# Patient Record
Sex: Female | Born: 1950
Health system: Southern US, Community
[De-identification: ages and names within clinical notes are randomized; demographics above are authoritative.]

## PROBLEM LIST (undated history)

## (undated) DIAGNOSIS — R011 Cardiac murmur, unspecified: Secondary | ICD-10-CM

## (undated) DIAGNOSIS — M8580 Other specified disorders of bone density and structure, unspecified site: Secondary | ICD-10-CM

## (undated) DIAGNOSIS — K219 Gastro-esophageal reflux disease without esophagitis: Secondary | ICD-10-CM

## (undated) DIAGNOSIS — M545 Low back pain, unspecified: Secondary | ICD-10-CM

## (undated) DIAGNOSIS — N261 Atrophy of kidney (terminal): Secondary | ICD-10-CM

## (undated) DIAGNOSIS — R7611 Nonspecific reaction to tuberculin skin test without active tuberculosis: Secondary | ICD-10-CM

## (undated) DIAGNOSIS — M810 Age-related osteoporosis without current pathological fracture: Secondary | ICD-10-CM

## (undated) DIAGNOSIS — G43909 Migraine, unspecified, not intractable, without status migrainosus: Secondary | ICD-10-CM

## (undated) HISTORY — DX: Cardiac murmur, unspecified: R01.1

## (undated) HISTORY — PX: TUBAL LIGATION: SHX77

## (undated) HISTORY — DX: Other specified disorders of bone density and structure, unspecified site: M85.80

## (undated) HISTORY — DX: Age-related osteoporosis without current pathological fracture: M81.0

## (undated) HISTORY — DX: Low back pain: M54.5

## (undated) HISTORY — DX: Migraine, unspecified, not intractable, without status migrainosus: G43.909

## (undated) HISTORY — DX: Gastro-esophageal reflux disease without esophagitis: K21.9

## (undated) HISTORY — DX: Nonspecific reaction to tuberculin skin test without active tuberculosis: R76.11

## (undated) HISTORY — DX: Low back pain, unspecified: M54.50

## (undated) HISTORY — DX: Atrophy of kidney (terminal): N26.1

---

## 1994-05-23 HISTORY — PX: ABDOMINAL HYSTERECTOMY: SHX81

## 2003-05-24 LAB — HM COLONOSCOPY

## 2010-02-17 ENCOUNTER — Emergency Department (HOSPITAL_COMMUNITY): Admission: EM | Admit: 2010-02-17 | Discharge: 2010-02-17 | Payer: Self-pay | Admitting: Family Medicine

## 2010-06-16 ENCOUNTER — Other Ambulatory Visit (HOSPITAL_COMMUNITY): Payer: Self-pay | Admitting: Sports Medicine

## 2010-06-16 ENCOUNTER — Other Ambulatory Visit: Payer: Self-pay

## 2010-06-16 DIAGNOSIS — Z139 Encounter for screening, unspecified: Secondary | ICD-10-CM

## 2010-06-28 ENCOUNTER — Ambulatory Visit (HOSPITAL_COMMUNITY)
Admission: RE | Admit: 2010-06-28 | Discharge: 2010-06-28 | Disposition: A | Discharge status: Home / Self Care | Payer: 59 | Source: Ambulatory Visit | Attending: Obstetrics and Gynecology | Admitting: Obstetrics and Gynecology

## 2010-06-28 DIAGNOSIS — Z1231 Encounter for screening mammogram for malignant neoplasm of breast: Secondary | ICD-10-CM | POA: Insufficient documentation

## 2010-06-28 DIAGNOSIS — Z139 Encounter for screening, unspecified: Secondary | ICD-10-CM

## 2010-09-13 ENCOUNTER — Other Ambulatory Visit (HOSPITAL_COMMUNITY): Payer: Self-pay | Admitting: Obstetrics and Gynecology

## 2010-09-13 DIAGNOSIS — Z139 Encounter for screening, unspecified: Secondary | ICD-10-CM

## 2010-10-15 ENCOUNTER — Encounter: Payer: Self-pay | Admitting: Internal Medicine

## 2010-10-15 ENCOUNTER — Ambulatory Visit (INDEPENDENT_AMBULATORY_CARE_PROVIDER_SITE_OTHER): Payer: 59 | Admitting: Internal Medicine

## 2010-10-15 ENCOUNTER — Telehealth: Payer: Self-pay | Admitting: Internal Medicine

## 2010-10-15 VITALS — BP 130/82 | HR 78 | Temp 98.6°F | Ht 63.0 in | Wt 123.5 lb

## 2010-10-15 DIAGNOSIS — K219 Gastro-esophageal reflux disease without esophagitis: Secondary | ICD-10-CM | POA: Insufficient documentation

## 2010-10-15 DIAGNOSIS — G44019 Episodic cluster headache, not intractable: Secondary | ICD-10-CM | POA: Insufficient documentation

## 2010-10-15 DIAGNOSIS — Z0000 Encounter for general adult medical examination without abnormal findings: Secondary | ICD-10-CM

## 2010-10-15 DIAGNOSIS — M899 Disorder of bone, unspecified: Secondary | ICD-10-CM

## 2010-10-15 DIAGNOSIS — M8580 Other specified disorders of bone density and structure, unspecified site: Secondary | ICD-10-CM

## 2010-10-15 HISTORY — DX: Other specified disorders of bone density and structure, unspecified site: M85.80

## 2010-10-15 NOTE — Patient Instructions (Signed)
Continue all other medications as before Please return in 1 year for your yearly visit, or sooner if needed, with Lab testing done 3-5 days before  

## 2010-10-15 NOTE — Assessment & Plan Note (Signed)

## 2010-10-15 NOTE — Progress Notes (Signed)
Subjective:    Patient ID: Beverly Shaw, female    DOB: 05/11/1951, 60 y.o.   MRN: [id number]  HPI  Here for wellness and f/u;  Overall doing ok;  Pt denies CP, worsening SOB, DOE, wheezing, orthopnea, PND, worsening LE edema, palpitations, dizziness or syncope.  Pt denies neurological change such as new Headache, facial or extremity weakness.  Pt denies polydipsia, polyuria, or low sugar symptoms. Pt states overall good compliance with treatment and medications, good tolerability, and trying to follow lower cholesterol diet.  Pt denies worsening depressive symptoms, suicidal ideation or panic. No fever, wt loss, night sweats, loss of appetite, or other constitutional symptoms.  Pt states good ability with ADL's, low fall risk, home safety reviewed and adequate, no significant changes in hearing or vision, and occasionally active with exercise.  Does have sense of heaviness to the legs below the knees, quit smoking over 15 yrs ago, mother s/p bilat amputation BKA due to PVD (nonsmoker) and just died 10 days ago. Pt is from [place] and o/w does not know much FH. Also mentions fingers go white with cold for over 15 yrs.  Does have daily exercise with walking, though she has sedentary job. Past Medical History  Diagnosis Date  . Migraines   . Positive TB test     Positive TB skin test  . GERD (gastroesophageal reflux disease)   . Heart murmur   . Episodic low back pain    Past Surgical History  Procedure Date  . Abdominal hysterectomy 1996    reports that she has never smoked. She does not have any smokeless tobacco history on file. She reports that she drinks alcohol. She reports that she does not use illicit drugs. family history includes Diabetes in her other and Heart disease in her father and mother. No Known Allergies No current outpatient prescriptions on file prior to visit.   Review of Systems Review of Systems  Constitutional: Negative for diaphoresis, activity change, appetite change  and unexpected weight change.  HENT: Negative for hearing loss, ear pain, facial swelling, mouth sores and neck stiffness.   Eyes: Negative for pain, redness and visual disturbance.  Respiratory: Negative for shortness of breath and wheezing.   Cardiovascular: Negative for chest pain and palpitations.  Gastrointestinal: Negative for diarrhea, blood in stool, abdominal distention and rectal pain.  Genitourinary: Negative for hematuria, flank pain and decreased urine volume.  Musculoskeletal: Negative for myalgias and joint swelling.  Skin: Negative for color change and wound.  Neurological: Negative for syncope and numbness.  Hematological: Negative for adenopathy.  Psychiatric/Behavioral: Negative for hallucinations, self-injury, decreased concentration and agitation.      Objective:   Physical Exam BP 130/82  Pulse 78  Temp(Src) 98.6 F (37 C) (Oral)  Ht 5\' 3"  (1.6 m)  Wt 123 lb 8 oz (56.019 kg)  BMI 21.88 kg/m2  SpO2 98% Physical Exam  VS noted Constitutional: Pt is oriented to person, place, and time. Appears well-developed and well-nourished.  HENT:  Head: Normocephalic and atraumatic.  Right Ear: External ear normal.  Left Ear: External ear normal.  Nose: Nose normal.  Mouth/Throat: Oropharynx is clear and moist.  Eyes: Conjunctivae and EOM are normal. Pupils are equal, round, and reactive to light.  Neck: Normal range of motion. Neck supple. No JVD present. No tracheal deviation present.  Cardiovascular: Normal rate, regular rhythm, normal heart sounds and intact distal pulses.   Pulmonary/Chest: Effort normal and breath sounds normal.  Abdominal: Soft. Bowel sounds are normal. There  is no tenderness.  Musculoskeletal: Normal range of motion. Exhibits no edema.  Lymphadenopathy:  Has no cervical adenopathy.  Neurological: Pt is alert and oriented to person, place, and time. Pt has normal reflexes. No cranial nerve deficit.  Skin: Skin is warm and dry. No rash noted.    Psychiatric:  Has  normal mood and affect. Behavior is normal.         Assessment & Plan:

## 2010-10-15 NOTE — Telephone Encounter (Signed)
Called the office of Dr. Thamas Jaegers GYN in [place]. They will be closed 10/13/10 through 10/17/2010 due to relocating. Will call back on Tuesday 10/19/2010 to have them fax the pts. Bone density results. Dr. Thamas Jaegers phone number [phone].

## 2010-10-15 NOTE — Telephone Encounter (Signed)
Please call DR Elissa Lovett GYN for results of most recent bone density

## 2010-10-19 NOTE — Telephone Encounter (Signed)
Called the GYN office in [place] and they agreed to send bone density.

## 2011-06-27 ENCOUNTER — Other Ambulatory Visit (HOSPITAL_COMMUNITY): Payer: Self-pay | Admitting: Obstetrics and Gynecology

## 2011-06-27 DIAGNOSIS — Z1231 Encounter for screening mammogram for malignant neoplasm of breast: Secondary | ICD-10-CM

## 2011-07-25 ENCOUNTER — Ambulatory Visit (HOSPITAL_COMMUNITY)
Admission: RE | Admit: 2011-07-25 | Discharge: 2011-07-25 | Disposition: A | Discharge status: Home / Self Care | Payer: 59 | Source: Ambulatory Visit | Attending: Obstetrics and Gynecology | Admitting: Obstetrics and Gynecology

## 2011-07-25 DIAGNOSIS — Z1231 Encounter for screening mammogram for malignant neoplasm of breast: Secondary | ICD-10-CM | POA: Insufficient documentation

## 2011-10-12 ENCOUNTER — Other Ambulatory Visit (INDEPENDENT_AMBULATORY_CARE_PROVIDER_SITE_OTHER): Payer: 59

## 2011-10-12 DIAGNOSIS — Z0000 Encounter for general adult medical examination without abnormal findings: Secondary | ICD-10-CM

## 2011-10-12 LAB — CBC WITH DIFFERENTIAL/PLATELET
Basophils Absolute: 0 10*3/uL (ref 0.0–0.1)
Eosinophils Absolute: 0.1 10*3/uL (ref 0.0–0.7)
Hemoglobin: 13 g/dL (ref 12.0–15.0)
Lymphocytes Relative: 27.5 % (ref 12.0–46.0)
Monocytes Relative: 9.3 % (ref 3.0–12.0)
Neutro Abs: 3.5 10*3/uL (ref 1.4–7.7)
Neutrophils Relative %: 61.9 % (ref 43.0–77.0)
RDW: 13.1 % (ref 11.5–14.6)

## 2011-10-12 LAB — HEPATIC FUNCTION PANEL
AST: 26 U/L (ref 0–37)
Albumin: 3.9 g/dL (ref 3.5–5.2)
Alkaline Phosphatase: 49 U/L (ref 39–117)
Bilirubin, Direct: 0.1 mg/dL (ref 0.0–0.3)
Total Bilirubin: 0.4 mg/dL (ref 0.3–1.2)

## 2011-10-12 LAB — URINALYSIS, ROUTINE W REFLEX MICROSCOPIC
Nitrite: NEGATIVE
Urine Glucose: NEGATIVE
Urobilinogen, UA: 0.2 (ref 0.0–1.0)

## 2011-10-12 LAB — BASIC METABOLIC PANEL
BUN: 23 mg/dL (ref 6–23)
CO2: 28 mEq/L (ref 19–32)
Calcium: 9 mg/dL (ref 8.4–10.5)
Chloride: 108 mEq/L (ref 96–112)
Creatinine, Ser: 0.8 mg/dL (ref 0.4–1.2)
Glucose, Bld: 89 mg/dL (ref 70–99)

## 2011-10-12 LAB — LIPID PANEL
LDL Cholesterol: 119 mg/dL — ABNORMAL HIGH (ref 0–99)
Total CHOL/HDL Ratio: 4

## 2011-10-15 ENCOUNTER — Encounter: Payer: Self-pay | Admitting: Internal Medicine

## 2011-10-15 DIAGNOSIS — R7611 Nonspecific reaction to tuberculin skin test without active tuberculosis: Secondary | ICD-10-CM | POA: Insufficient documentation

## 2011-10-15 DIAGNOSIS — M545 Low back pain, unspecified: Secondary | ICD-10-CM | POA: Insufficient documentation

## 2011-10-19 ENCOUNTER — Ambulatory Visit (INDEPENDENT_AMBULATORY_CARE_PROVIDER_SITE_OTHER): Payer: 59 | Admitting: Internal Medicine

## 2011-10-19 ENCOUNTER — Encounter: Payer: Self-pay | Admitting: Internal Medicine

## 2011-10-19 VITALS — BP 100/62 | HR 71 | Temp 98.1°F | Ht 62.0 in | Wt 120.1 lb

## 2011-10-19 DIAGNOSIS — B351 Tinea unguium: Secondary | ICD-10-CM

## 2011-10-19 DIAGNOSIS — M899 Disorder of bone, unspecified: Secondary | ICD-10-CM

## 2011-10-19 DIAGNOSIS — R143 Flatulence: Secondary | ICD-10-CM

## 2011-10-19 DIAGNOSIS — Z0000 Encounter for general adult medical examination without abnormal findings: Secondary | ICD-10-CM

## 2011-10-19 DIAGNOSIS — R140 Abdominal distension (gaseous): Secondary | ICD-10-CM

## 2011-10-19 DIAGNOSIS — M8580 Other specified disorders of bone density and structure, unspecified site: Secondary | ICD-10-CM

## 2011-10-19 DIAGNOSIS — R350 Frequency of micturition: Secondary | ICD-10-CM

## 2011-10-19 DIAGNOSIS — E785 Hyperlipidemia, unspecified: Secondary | ICD-10-CM

## 2011-10-19 DIAGNOSIS — R141 Gas pain: Secondary | ICD-10-CM

## 2011-10-19 MED ORDER — CEPHALEXIN 500 MG PO CAPS: 500.0000 mg | ORAL_CAPSULE | Freq: Four times a day (QID) | ORAL | Status: AC

## 2011-10-19 MED ORDER — TERBINAFINE HCL 250 MG PO TABS: 250.0000 mg | ORAL_TABLET | Freq: Every day | ORAL | Status: DC

## 2011-10-19 NOTE — Assessment & Plan Note (Signed)

## 2011-10-19 NOTE — Patient Instructions (Addendum)
Take all new medications as prescribed - the generic Lamisil fo 6 weeks, and antibiotic Continue all other medications as before You can also try Align OTC for the bloating symptom  Please return in 3 wks for repeat Liver tests only You will be contacted by phone if any changes need to be made immediately.  Otherwise, you will receive a letter about your results with an explanation. Please stop by the scheduling desk to have your Bone Density testing scheduled You are otherwise up to date with prevention Please continue your efforts at being more active, low cholesterol diet, and weight control. Please remember to followup with your GYN for the yearly pap smear and/or mammogram as you do Please return in 1 year for your yearly visit, or sooner if needed, with Lab testing done 3-5 days before

## 2011-10-21 ENCOUNTER — Ambulatory Visit (INDEPENDENT_AMBULATORY_CARE_PROVIDER_SITE_OTHER)
Admission: RE | Admit: 2011-10-21 | Discharge: 2011-10-21 | Disposition: A | Discharge status: Home / Self Care | Payer: 59 | Source: Ambulatory Visit | Attending: Internal Medicine | Admitting: Internal Medicine

## 2011-10-21 DIAGNOSIS — M8580 Other specified disorders of bone density and structure, unspecified site: Secondary | ICD-10-CM

## 2011-10-21 DIAGNOSIS — M899 Disorder of bone, unspecified: Secondary | ICD-10-CM

## 2011-10-23 ENCOUNTER — Encounter: Payer: Self-pay | Admitting: Internal Medicine

## 2011-10-23 DIAGNOSIS — E785 Hyperlipidemia, unspecified: Secondary | ICD-10-CM | POA: Insufficient documentation

## 2011-10-23 NOTE — Assessment & Plan Note (Signed)
For lamisil rx,  to f/u any worsening symptoms or concerns

## 2011-10-23 NOTE — Progress Notes (Signed)
Subjective:    Patient ID: Beverly Shaw, female    DOB: 04/09/1951, 61 y.o.   MRN: [id number]  HPI  Here for wellness and f/u;  Overall doing ok;  Pt denies CP, worsening SOB, DOE, wheezing, orthopnea, PND, worsening LE edema, palpitations, dizziness or syncope.  Pt denies neurological change such as new Headache, facial or extremity weakness.  Pt denies polydipsia, polyuria, or low sugar symptoms. Pt states overall good compliance with treatment and medications, good tolerability.  Pt denies worsening depressive symptoms, suicidal ideation or panic. No fever, wt loss, night sweats, loss of appetite, or other constitutional symptoms.  Pt states good ability with ADL's, low fall risk, home safety reviewed and adequate, no significant changes in hearing or vision, and occasionally active with exercise.  Mentions would like tx for several onychomycotic fingernails, has some bloating and discomfort on occasion, not really trying to follow lower chol diet recently.  Also with freq UTI symptoms, mild with freq and urgency for 3 days Past Medical History  Diagnosis Date  . Migraines   . Positive TB test     Positive TB skin test  . GERD (gastroesophageal reflux disease)   . Heart murmur   . Episodic low back pain   . Osteopenia 10/15/2010   Past Surgical History  Procedure Date  . Abdominal hysterectomy 1996  . Tubal ligation     reports that she has never smoked. She has never used smokeless tobacco. She reports that she drinks alcohol. She reports that she does not use illicit drugs. family history includes Diabetes in her other and Heart disease in her father and mother. No Known Allergies Current Outpatient Prescriptions on File Prior to Visit  Medication Sig Dispense Refill  . calcium carbonate (OS-CAL) 600 MG TABS Take 600 mg by mouth daily.        . Multiple Vitamin (MULTIVITAMIN) capsule Take 1 capsule by mouth daily.         Review of Systems Review of Systems  Constitutional: Negative  for diaphoresis, activity change, appetite change and unexpected weight change.  HENT: Negative for hearing loss, ear pain, facial swelling, mouth sores and neck stiffness.   Eyes: Negative for pain, redness and visual disturbance.  Respiratory: Negative for shortness of breath and wheezing.   Cardiovascular: Negative for chest pain and palpitations.  Gastrointestinal: Negative for diarrhea, blood in stool, abdominal distention and rectal pain.  Genitourinary: Negative for hematuria, flank pain and decreased urine volume.  Musculoskeletal: Negative for myalgias and joint swelling.  Skin: Negative for color change and wound.  Neurological: Negative for syncope and numbness.  Hematological: Negative for adenopathy.  Psychiatric/Behavioral: Negative for hallucinations, self-injury, decreased concentration and agitation.      Objective:   Physical Exam BP 100/62  Pulse 71  Temp(Src) 98.1 F (36.7 C) (Oral)  Ht 5\' 2"  (1.575 m)  Wt 120 lb 2 oz (54.488 kg)  BMI 21.97 kg/m2  SpO2 95% Physical Exam  VS noted Constitutional: Pt is oriented to person, place, and time. Appears well-developed and well-nourished.  HENT:  Head: Normocephalic and atraumatic.  Right Ear: External ear normal.  Left Ear: External ear normal.  Nose: Nose normal.  Mouth/Throat: Oropharynx is clear and moist.  Eyes: Conjunctivae and EOM are normal. Pupils are equal, round, and reactive to light.  Neck: Normal range of motion. Neck supple. No JVD present. No tracheal deviation present.  Cardiovascular: Normal rate, regular rhythm, normal heart sounds and intact distal pulses.   Pulmonary/Chest: Effort normal  and breath sounds normal.  Abdominal: Soft. Bowel sounds are normal. There is no tenderness.  - benign exam Musculoskeletal: Normal range of motion. Exhibits no edema.  Lymphadenopathy:  Has no cervical adenopathy.  Neurological: Pt is alert and oriented to person, place, and time. Pt has normal reflexes. No  cranial nerve deficit.  Skin: Skin is warm and dry. No rash noted. Several nails with subungual onychomycotic change Psychiatric:  Has  normal mood and affect. Behavior is normal. except 1+ nervous    Assessment & Plan:

## 2011-10-23 NOTE — Assessment & Plan Note (Signed)
For dxa as she is due 

## 2011-10-23 NOTE — Assessment & Plan Note (Signed)
Ok for [employer] align prn

## 2011-10-23 NOTE — Assessment & Plan Note (Signed)
Mild to mod, for antibx course,  to f/u any worsening symptoms or concerns 

## 2011-10-23 NOTE — Assessment & Plan Note (Signed)
Lab Results  Component Value Date   LDLCALC 119* 10/12/2011   For lower chol diet

## 2011-10-31 ENCOUNTER — Telehealth: Payer: Self-pay

## 2011-10-31 NOTE — Telephone Encounter (Signed)
Ok to forward to [employer], who checks for [place]  Jasmine December to let pt know

## 2011-10-31 NOTE — Telephone Encounter (Signed)
Pt called stating she agreed to start Prolia injections for Osteopenia but has yet to be contacted with information regarding cost. Has JWJ started verification process?

## 2011-11-01 NOTE — Telephone Encounter (Signed)
Will forward note to MD. Insurance requesting clinical information and a letter from MD of medical necessity.  Placed form on MD's desk.

## 2011-11-09 ENCOUNTER — Encounter: Payer: Self-pay | Admitting: Internal Medicine

## 2011-11-09 ENCOUNTER — Other Ambulatory Visit (INDEPENDENT_AMBULATORY_CARE_PROVIDER_SITE_OTHER): Payer: 59

## 2011-11-09 DIAGNOSIS — B351 Tinea unguium: Secondary | ICD-10-CM

## 2011-11-09 LAB — HEPATIC FUNCTION PANEL
ALT: 25 U/L (ref 0–35)
AST: 29 U/L (ref 0–37)
Total Protein: 6.5 g/dL (ref 6.0–8.3)

## 2011-11-18 ENCOUNTER — Telehealth: Payer: Self-pay

## 2011-11-18 NOTE — Telephone Encounter (Signed)
Pt called requesting status of Prolia injection certification, please advise.

## 2011-11-20 ENCOUNTER — Encounter: Payer: Self-pay | Admitting: Internal Medicine

## 2011-11-20 NOTE — Telephone Encounter (Signed)
Insurance is requiring a letter of medical necessity, which has been done but not yet sent; will be sent mon July for hopeful approval of the prolia

## 2011-11-21 NOTE — Telephone Encounter (Signed)
Put letter requested on Beverly Shaw's desk along with form

## 2011-11-22 ENCOUNTER — Telehealth: Payer: Self-pay | Admitting: *Deleted

## 2011-11-22 NOTE — Telephone Encounter (Signed)
Left msg on triage requesting to speak with nurse. Have left several msg abt prolia injection haven't received call back. Can also call cell # [phone].. 11/22/11@2 :26pm/LMB

## 2011-12-20 ENCOUNTER — Ambulatory Visit (INDEPENDENT_AMBULATORY_CARE_PROVIDER_SITE_OTHER): Payer: 59

## 2011-12-20 DIAGNOSIS — M810 Age-related osteoporosis without current pathological fracture: Secondary | ICD-10-CM

## 2011-12-20 MED ORDER — DENOSUMAB 60 MG/ML ~~LOC~~ SOLN
60.0000 mg | Freq: Once | SUBCUTANEOUS | Status: AC
  Administered 2011-12-20: 60 mg via SUBCUTANEOUS

## 2012-07-05 ENCOUNTER — Ambulatory Visit: Payer: 59

## 2012-07-07 ENCOUNTER — Other Ambulatory Visit: Payer: Self-pay

## 2012-07-10 ENCOUNTER — Ambulatory Visit (INDEPENDENT_AMBULATORY_CARE_PROVIDER_SITE_OTHER): Payer: 59

## 2012-07-10 DIAGNOSIS — M8580 Other specified disorders of bone density and structure, unspecified site: Secondary | ICD-10-CM

## 2012-07-10 DIAGNOSIS — M899 Disorder of bone, unspecified: Secondary | ICD-10-CM

## 2012-07-10 DIAGNOSIS — M949 Disorder of cartilage, unspecified: Secondary | ICD-10-CM

## 2012-07-10 MED ORDER — DENOSUMAB 60 MG/ML ~~LOC~~ SOLN
60.0000 mg | Freq: Once | SUBCUTANEOUS | Status: AC
  Administered 2012-07-10: 60 mg via SUBCUTANEOUS

## 2012-07-27 ENCOUNTER — Other Ambulatory Visit (HOSPITAL_COMMUNITY): Payer: Self-pay | Admitting: Obstetrics and Gynecology

## 2012-07-27 DIAGNOSIS — Z1231 Encounter for screening mammogram for malignant neoplasm of breast: Secondary | ICD-10-CM

## 2012-08-03 ENCOUNTER — Ambulatory Visit (HOSPITAL_COMMUNITY)
Admission: RE | Admit: 2012-08-03 | Discharge: 2012-08-03 | Disposition: A | Discharge status: Home / Self Care | Payer: 59 | Source: Ambulatory Visit | Attending: Obstetrics and Gynecology | Admitting: Obstetrics and Gynecology

## 2012-08-03 ENCOUNTER — Ambulatory Visit (HOSPITAL_COMMUNITY): Payer: 59

## 2012-08-03 DIAGNOSIS — Z1231 Encounter for screening mammogram for malignant neoplasm of breast: Secondary | ICD-10-CM | POA: Insufficient documentation

## 2012-08-20 ENCOUNTER — Encounter: Payer: Self-pay | Admitting: Internal Medicine

## 2012-08-20 ENCOUNTER — Ambulatory Visit (INDEPENDENT_AMBULATORY_CARE_PROVIDER_SITE_OTHER): Payer: 59 | Admitting: Internal Medicine

## 2012-08-20 VITALS — BP 130/68 | HR 59 | Temp 97.6°F | Ht 63.0 in | Wt 121.5 lb

## 2012-08-20 DIAGNOSIS — M25511 Pain in right shoulder: Secondary | ICD-10-CM

## 2012-08-20 DIAGNOSIS — M25519 Pain in unspecified shoulder: Secondary | ICD-10-CM

## 2012-08-20 MED ORDER — NAPROXEN 500 MG PO TABS: 500.0000 mg | ORAL_TABLET | Freq: Two times a day (BID) | ORAL | Status: DC

## 2012-08-20 MED ORDER — PREDNISONE 10 MG PO TABS: ORAL_TABLET | ORAL | Status: DC

## 2012-08-20 NOTE — Assessment & Plan Note (Signed)
Although neuritic radicular like pain cant be completely ruled out, but suspect primarily impingement syndrome and bicipital tendonitis, neuro exam benign, declines ortho referral, for predpack asd, nsaid prn

## 2012-08-20 NOTE — Progress Notes (Signed)
  Subjective:    Patient ID: Beverly Shaw, female    DOB: [birth year], 62 y.o.   MRN: [id number]  HPI Here with c/o 2 wks onset achy right shoulder primarily posterior and trapezoid areas, some worse to abduction but still has FROM, some radiation of the discomfort to the right neck and more distal RUE past the elbow but not clear if pain starts at the neck;  May have had some dropping objects recently but she is certain she does not have weakness or numbness.  Pain worse with Hawkins type test maneuver with RUE.  Started after working in the garden, but does this occasionally in the past without RUE symptoms.  Pt denies chest pain, increased sob or doe, wheezing, orthopnea, PND, increased LE swelling, palpitations, dizziness or syncope. Past Medical History  Diagnosis Date  . Migraines   . Positive TB test     Positive TB skin test  . GERD (gastroesophageal reflux disease)   . Heart murmur   . Episodic low back pain   . Osteopenia 10/15/2010   Past Surgical History  Procedure Laterality Date  . Abdominal hysterectomy  1996  . Tubal ligation      reports that she has never smoked. She has never used smokeless tobacco. She reports that  drinks alcohol. She reports that she does not use illicit drugs. family history includes Diabetes in her other and Heart disease in her father and mother. No Known Allergies Current Outpatient Prescriptions on File Prior to Visit  Medication Sig Dispense Refill  . calcium carbonate (OS-CAL) 600 MG TABS Take 600 mg by mouth daily.        Marland Kitchen estradiol (VIVELLE-DOT) 0.075 MG/24HR Place 1 patch onto the skin 2 (two) times a week.      . Multiple Vitamin (MULTIVITAMIN) capsule Take 1 capsule by mouth daily.         No current facility-administered medications on file prior to visit.   Review of Systems  Constitutional: Negative for unexpected weight change, or unusual diaphoresis  HENT: Negative for tinnitus.   Eyes: Negative for photophobia and visual  disturbance.  Respiratory: Negative for choking and stridor.   Gastrointestinal: Negative for vomiting and blood in stool.  Genitourinary: Negative for hematuria and decreased urine volume.  Musculoskeletal: Negative for acute joint swelling Skin: Negative for color change and wound.  Neurological: Negative for tremors and numbness other than noted  Psychiatric/Behavioral: Negative for decreased concentration or  hyperactivity.       Objective:   Physical Exam BP 130/68  Pulse 59  Temp(Src) 97.6 F (36.4 C) (Oral)  Ht 5\' 3"  (1.6 m)  Wt 121 lb 8 oz (55.112 kg)  BMI 21.53 kg/m2  SpO2 99% VS noted, not ill appearing Constitutional: Pt appears well-developed and well-nourished.  HENT: Head: NCAT.  Right Ear: External ear normal.  Left Ear: External ear normal.  Eyes: Conjunctivae and EOM are normal. Pupils are equal, round, and reactive to light.  Neck: Normal range of motion. Neck supple. Nontender spine and paraspinal Right trapezoid mild tender Right shoulder FROM, NT including the bursa but some discomfort elicited on active abduction Some tender at the right bicipital tendon insertion site Cardiovascular: Normal rate and regular rhythm.   Pulmonary/Chest: Effort normal and breath sounds normal.  Neurological: Pt is alert. Not confused ., motor/dtr/sens intact to UE's Skin: Skin is warm. No erythema. No rash Psychiatric: Pt behavior is normal. Thought content normal.     Assessment & Plan:

## 2012-08-20 NOTE — Patient Instructions (Addendum)
Please take all new medication as prescribed- the pain medication and prednisone Please continue all other medications as before Please continue to monitor for 1-2 wks and if not improved we could refer to orthopedic Thank you for enrolling in MyChart. Please follow the instructions below to securely access your online medical record. MyChart allows you to send messages to your doctor, view your test results, renew your prescriptions, schedule appointments, and more. To Log into My Chart online, please go by [employer] or [employer] to [email or web address], or download the MyChart App from the [employer] of [employer] .  Your Username is: [phone] ([email or web address])

## 2012-09-24 ENCOUNTER — Telehealth: Payer: Self-pay | Admitting: Internal Medicine

## 2012-09-24 MED ORDER — HYDROXYZINE HCL 10 MG PO TABS: 10.0000 mg | ORAL_TABLET | ORAL | Status: DC | PRN

## 2012-09-24 MED ORDER — METHYLPREDNISOLONE 4 MG PO KIT: PACK | ORAL | Status: DC

## 2012-09-24 NOTE — Telephone Encounter (Signed)
Pt informed rx's sent to [facility].

## 2012-09-24 NOTE — Telephone Encounter (Signed)
Please advise in JWJ's absence.

## 2012-09-24 NOTE — Telephone Encounter (Signed)
Patient Information:  Caller Name: Hildegarde  Phone: [phone]  Patient: Temperence, Zenor  Gender: Female  DOB: 02/24/1951  Age: 62 Years  PCP: Oliver Barre (Adults only)  Office Follow Up:  Does the office need to follow up with this patient?: Yes  Instructions For The Office: Patient requests Rx to [facility].  She states she is working today and unable to get to office.  Please call patient either way.  Thank you.   Symptoms  Reason For Call & Symptoms: Patient reports she worked in yard over the weekend and got poison oak. It is on right hand, arm, legs and thighs. Relates she has difficulty sleeping due to itching. See Today in Office per Poison Ivy- [facility] or [place] protocol.  Reviewed Health History In EMR: Yes  Reviewed Medications In EMR: Yes  Reviewed Allergies In EMR: Yes  Reviewed Surgeries / Procedures: Yes  Date of Onset of Symptoms: 09/21/2012  Treatments Tried: Hydrocortisone cream, Calamine Lotion- temporary minor relief  Treatments Tried Worked: No  Guideline(s) Used:  Poison Ivy - Oak or [facility]  Disposition Per Guideline:   See Today in Office  Reason For Disposition Reached:   Severe itching interferes with normal activities (e.g., work or school) or prevents sleep  Advice Given:  Hydrocortisone Cream for Itching:   Apply 1% hydrocortisone cream 4 times a day to reduce itching. Use it for 5 days.  Keep the cream in the refrigerator (Reason: it feels better if applied cold).  Apply Cold to the Area:  Soak the involved area in cool water for 20 minutes or massage it with an ice cube as often as necessary to reduce itching and oozing.  Oral Antihistamine Medication for Itching:   An over-the-counter antihistamine that causes less sleepiness is loratadine (e.g., Alavert or Claritin).  Avoid Scratching:   Cut your fingernails short and try not to scratch so as to prevent a secondary infection from bacteria.  Contagiousness:  Poison ivy or oak is  not contagious to others.  Expected Course:  Usually lasts 2 weeks. Treatment reduces the severity of the symptoms, not how long they last.  Call Back If:  It looks infected  You become worse.  Patient Refused Recommendation:  Patient Requests Prescription  Asks for Rx. to [facility]

## 2012-09-24 NOTE — Telephone Encounter (Signed)
Try medrol dose pak and hydroxyzine

## 2012-10-12 ENCOUNTER — Other Ambulatory Visit (INDEPENDENT_AMBULATORY_CARE_PROVIDER_SITE_OTHER): Payer: 59

## 2012-10-12 DIAGNOSIS — Z0000 Encounter for general adult medical examination without abnormal findings: Secondary | ICD-10-CM

## 2012-10-12 LAB — URINALYSIS, ROUTINE W REFLEX MICROSCOPIC
Bilirubin Urine: NEGATIVE
Hgb urine dipstick: NEGATIVE
Total Protein, Urine: NEGATIVE
Urine Glucose: NEGATIVE
pH: 7 (ref 5.0–8.0)

## 2012-10-12 LAB — CBC WITH DIFFERENTIAL/PLATELET
Basophils Absolute: 0 10*3/uL (ref 0.0–0.1)
Eosinophils Relative: 0.8 % (ref 0.0–5.0)
HCT: 37.9 % (ref 36.0–46.0)
Hemoglobin: 13.4 g/dL (ref 12.0–15.0)
Lymphocytes Relative: 27.3 % (ref 12.0–46.0)
Lymphs Abs: 1.9 10*3/uL (ref 0.7–4.0)
Monocytes Relative: 9 % (ref 3.0–12.0)
Neutro Abs: 4.3 10*3/uL (ref 1.4–7.7)
Platelets: 163 10*3/uL (ref 150.0–400.0)
RDW: 13.2 % (ref 11.5–14.6)
WBC: 6.9 10*3/uL (ref 4.5–10.5)

## 2012-10-12 LAB — LIPID PANEL
Cholesterol: 208 mg/dL — ABNORMAL HIGH (ref 0–200)
HDL: 50.7 mg/dL (ref >39.00)
Triglycerides: 277 mg/dL — ABNORMAL HIGH (ref 0.0–149.0)

## 2012-10-12 LAB — HEPATIC FUNCTION PANEL
ALT: 24 U/L (ref 0–35)
AST: 28 U/L (ref 0–37)
Alkaline Phosphatase: 34 U/L — ABNORMAL LOW (ref 39–117)
Total Bilirubin: 0.6 mg/dL (ref 0.3–1.2)

## 2012-10-12 LAB — TSH: TSH: 0.78 u[IU]/mL (ref 0.35–5.50)

## 2012-10-12 LAB — BASIC METABOLIC PANEL
Calcium: 8.9 mg/dL (ref 8.4–10.5)
GFR: 104.24 mL/min (ref >60.00)
Glucose, Bld: 120 mg/dL — ABNORMAL HIGH (ref 70–99)
Sodium: 139 mEq/L (ref 135–145)

## 2012-10-19 ENCOUNTER — Ambulatory Visit (INDEPENDENT_AMBULATORY_CARE_PROVIDER_SITE_OTHER): Payer: 59 | Admitting: Internal Medicine

## 2012-10-19 ENCOUNTER — Encounter: Payer: Self-pay | Admitting: Internal Medicine

## 2012-10-19 VITALS — BP 102/64 | HR 65 | Temp 97.1°F | Ht 63.0 in | Wt 123.4 lb

## 2012-10-19 DIAGNOSIS — Z0001 Encounter for general adult medical examination with abnormal findings: Secondary | ICD-10-CM | POA: Insufficient documentation

## 2012-10-19 DIAGNOSIS — Z2911 Encounter for prophylactic immunotherapy for respiratory syncytial virus (RSV): Secondary | ICD-10-CM

## 2012-10-19 DIAGNOSIS — E785 Hyperlipidemia, unspecified: Secondary | ICD-10-CM

## 2012-10-19 DIAGNOSIS — M810 Age-related osteoporosis without current pathological fracture: Secondary | ICD-10-CM

## 2012-10-19 DIAGNOSIS — R7309 Other abnormal glucose: Secondary | ICD-10-CM

## 2012-10-19 DIAGNOSIS — R7302 Impaired glucose tolerance (oral): Secondary | ICD-10-CM | POA: Insufficient documentation

## 2012-10-19 DIAGNOSIS — Z23 Encounter for immunization: Secondary | ICD-10-CM

## 2012-10-19 DIAGNOSIS — Z0000 Encounter for general adult medical examination without abnormal findings: Secondary | ICD-10-CM

## 2012-10-19 DIAGNOSIS — L639 Alopecia areata, unspecified: Secondary | ICD-10-CM

## 2012-10-19 HISTORY — DX: Age-related osteoporosis without current pathological fracture: M81.0

## 2012-10-19 MED ORDER — ALENDRONATE SODIUM 70 MG PO TABS
70.0000 mg | ORAL_TABLET | ORAL | Status: DC
Start: 2012-10-19 — End: 2013-10-10

## 2012-10-19 NOTE — Assessment & Plan Note (Signed)

## 2012-10-19 NOTE — Progress Notes (Signed)
Subjective:    Patient ID: Beverly Shaw, female    DOB: 09-27-1950, 62 y.o.   MRN: [id number]  HPI Here for wellness and f/u;  Overall doing ok;  Pt denies CP, worsening SOB, DOE, wheezing, orthopnea, PND, worsening LE edema, palpitations, dizziness or syncope.  Pt denies neurological change such as new headache, facial or extremity weakness.  Pt denies polydipsia, polyuria, or low sugar symptoms. Pt states overall good compliance with treatment and medications, good tolerability, and has been trying to follow lower cholesterol diet.  Pt denies worsening depressive symptoms, suicidal ideation or panic. No fever, night sweats, wt loss, loss of appetite, or other constitutional symptoms.  Pt states good ability with ADL's, has low fall risk, home safety reviewed and adequate, no other significant changes in hearing or vision, and only occasionally active with exercise. No acute complaints.  Prolia unfortunately this yr is now $500/6 mo copay.  Also has an area of alopecia areata to left frontal scalp but plans to f/u with derm soon. Past Medical History  Diagnosis Date  . Migraines   . Positive TB test     Positive TB skin test  . GERD (gastroesophageal reflux disease)   . Heart murmur   . Episodic low back pain   . Osteopenia 10/15/2010   Past Surgical History  Procedure Laterality Date  . Abdominal hysterectomy  1996  . Tubal ligation      reports that she has never smoked. She has never used smokeless tobacco. She reports that  drinks alcohol. She reports that she does not use illicit drugs. family history includes Diabetes in her other and Heart disease in her father and mother. No Known Allergies Current Outpatient Prescriptions on File Prior to Visit  Medication Sig Dispense Refill  . calcium carbonate (OS-CAL) 600 MG TABS Take 600 mg by mouth daily.        Marland Kitchen estradiol (VIVELLE-DOT) 0.075 MG/24HR Place 1 patch onto the skin 2 (two) times a week.      . Multiple Vitamin (MULTIVITAMIN)  capsule Take 1 capsule by mouth daily.        . hydrOXYzine (ATARAX/VISTARIL) 10 MG tablet Take 1 tablet (10 mg total) by mouth every 4 (four) hours as needed for itching.  60 tablet  0   No current facility-administered medications on file prior to visit.   Review of Systems Constitutional: Negative for diaphoresis, activity change, appetite change or unexpected weight change.  HENT: Negative for hearing loss, ear pain, facial swelling, mouth sores and neck stiffness.   Eyes: Negative for pain, redness and visual disturbance.  Respiratory: Negative for shortness of breath and wheezing.   Cardiovascular: Negative for chest pain and palpitations.  Gastrointestinal: Negative for diarrhea, blood in stool, abdominal distention or other pain Genitourinary: Negative for hematuria, flank pain or change in urine volume.  Musculoskeletal: Negative for myalgias and joint swelling.  Skin: Negative for color change and wound.  Neurological: Negative for syncope and numbness. other than noted Hematological: Negative for adenopathy.  Psychiatric/Behavioral: Negative for hallucinations, self-injury, decreased concentration and agitation.      Objective:   Physical Exam BP 102/64  Pulse 65  Temp(Src) 97.1 F (36.2 C) (Oral)  Ht 5\' 3"  (1.6 m)  Wt 123 lb 6 oz (55.963 kg)  BMI 21.86 kg/m2  SpO2 99% VS noted,  Constitutional: Pt is oriented to person, place, and time. Appears well-developed and well-nourished.  Head: Normocephalic and atraumatic.  Right Ear: External ear normal.  Left Ear: External  ear normal.  Nose: Nose normal.  Mouth/Throat: Oropharynx is clear and moist.  Eyes: Conjunctivae and EOM are normal. Pupils are equal, round, and reactive to light.  Neck: Normal range of motion. Neck supple. No JVD present. No tracheal deviation present.  Cardiovascular: Normal rate, regular rhythm, normal heart sounds and intact distal pulses.   Pulmonary/Chest: Effort normal and breath sounds  normal.  Abdominal: Soft. Bowel sounds are normal. There is no tenderness. No HSM  Musculoskeletal: Normal range of motion. Exhibits no edema.  Lymphadenopathy:  Has no cervical adenopathy.  Neurological: Pt is alert and oriented to person, place, and time. Pt has normal reflexes. No cranial nerve deficit.  Skin: Skin is warm and dry. No rash noted. Some alopecia noted 1.5 cm area left frontal scalp Psychiatric:  Has  normal mood and affect. Behavior is normal.      Assessment & Plan:

## 2012-10-19 NOTE — Patient Instructions (Addendum)
Please take all new medication as prescribed - the fosamax OK to stop the prolia due to the cost Please continue all other medications as before, and refills have been done if requested. Please have the pharmacy call with any other refills you may need. Please take Aspirin 81 mg - 1 per day - COATED only Please continue your efforts at being more active, low cholesterol diet, and weight control. Please keep your appointments with your specialists as you have planned - dermatology and GYN You had the shingles shot today  Thank you for enrolling in MyChart. Please follow the instructions below to securely access your online medical record. MyChart allows you to send messages to your doctor, view your test results, renew your prescriptions, schedule appointments, and more.  Please return in 1 year for your yearly visit, or sooner if needed, with Lab testing done 3-5 days before

## 2012-10-19 NOTE — Assessment & Plan Note (Signed)
To f/u derm, ok for rogaine for men daily prn as well

## 2012-10-19 NOTE — Addendum Note (Signed)
Addended by: Scharlene Gloss B on: 10/19/2012 09:47 AM   Modules accepted: Orders

## 2012-10-19 NOTE — Assessment & Plan Note (Signed)
Lowest t-score -2.9 in 2013, despite on estradiol replacement for yrs, prolia now too expensive, to change to fosamax weekly

## 2012-10-19 NOTE — Assessment & Plan Note (Signed)
Mild worsening, to follow lwoer chol diet

## 2012-11-29 ENCOUNTER — Ambulatory Visit (INDEPENDENT_AMBULATORY_CARE_PROVIDER_SITE_OTHER): Payer: 59 | Admitting: Internal Medicine

## 2012-11-29 ENCOUNTER — Encounter: Payer: Self-pay | Admitting: Internal Medicine

## 2012-11-29 VITALS — BP 134/80 | HR 60 | Temp 97.7°F | Ht 63.0 in | Wt 123.0 lb

## 2012-11-29 DIAGNOSIS — J069 Acute upper respiratory infection, unspecified: Secondary | ICD-10-CM | POA: Insufficient documentation

## 2012-11-29 DIAGNOSIS — J309 Allergic rhinitis, unspecified: Secondary | ICD-10-CM | POA: Insufficient documentation

## 2012-11-29 DIAGNOSIS — M5412 Radiculopathy, cervical region: Secondary | ICD-10-CM | POA: Insufficient documentation

## 2012-11-29 MED ORDER — AZITHROMYCIN 250 MG PO TABS: ORAL_TABLET | ORAL | Status: DC

## 2012-11-29 MED ORDER — TRAMADOL HCL 50 MG PO TABS: 50.0000 mg | ORAL_TABLET | Freq: Four times a day (QID) | ORAL | Status: DC | PRN

## 2012-11-29 MED ORDER — PREDNISONE 10 MG PO TABS: ORAL_TABLET | ORAL | Status: DC

## 2012-11-29 MED ORDER — CYCLOBENZAPRINE HCL 5 MG PO TABS: 5.0000 mg | ORAL_TABLET | Freq: Three times a day (TID) | ORAL | Status: DC | PRN

## 2012-11-29 MED ORDER — KETOROLAC TROMETHAMINE 30 MG/ML IJ SOLN
30.0000 mg | Freq: Once | INTRAMUSCULAR | Status: AC
  Administered 2012-11-29: 30 mg via INTRAMUSCULAR

## 2012-11-29 NOTE — Assessment & Plan Note (Signed)
Ok for otc allegra prn 

## 2012-11-29 NOTE — Assessment & Plan Note (Signed)
Mild to mod, for antibx course,  to f/u any worsening symptoms or concerns 

## 2012-11-29 NOTE — Assessment & Plan Note (Signed)
Most likely vs facet syndrome vs flare DDD; will hold on films, for toradol IM now, and tramadol/predpack/flexeril trial;  If not improved in 3-5 days would consider MRI due to severity and length of time of symptoms

## 2012-11-29 NOTE — Progress Notes (Signed)
Subjective:    Patient ID: Beverly Shaw, female    DOB: [birth year], 62 y.o.   MRN: [id number]  HPI  Here with 1 mo onset right side neck pain, burning, mod to severe, worse to turn head to right, hard to drive, worse toward the end of the work day though no unusual activity or position recently there, no overt cause to start such as lifting, no fever, trauma, and fortunately no specific radicular symptoms to arms except for radiation of pain to right trapezoid area, tender there as well as right neck.  Also incidentally with right ear pain not clear if related of with URi symptoms in the past 2 days, right ear pain worse with swallowing and has several new LN's noted right neck.  Does have several wks ongoing nasal allergy symptoms with clearish congestion, itch and sneezing, without fever, pain, ST, cough, swelling or wheezing, but overall quite mild, and not felt need for treatment Past Medical History  Diagnosis Date  . Migraines   . Positive TB test     Positive TB skin test  . GERD (gastroesophageal reflux disease)   . Heart murmur   . Episodic low back pain   . Osteopenia 10/15/2010  . Osteoporosis, unspecified 10/19/2012   Past Surgical History  Procedure Laterality Date  . Abdominal hysterectomy  1996  . Tubal ligation      reports that she has never smoked. She has never used smokeless tobacco. She reports that  drinks alcohol. She reports that she does not use illicit drugs. family history includes Diabetes in her other and Heart disease in her father and mother. No Known Allergies Current Outpatient Prescriptions on File Prior to Visit  Medication Sig Dispense Refill  . alendronate (FOSAMAX) 70 MG tablet Take 1 tablet (70 mg total) by mouth every 7 (seven) days. Take with a full glass of water on an empty stomach.  12 tablet  3  . calcium carbonate (OS-CAL) 600 MG TABS Take 600 mg by mouth daily.        Marland Kitchen estradiol (VIVELLE-DOT) 0.075 MG/24HR Place 1 patch onto the skin 2 (two)  times a week.      . hydrOXYzine (ATARAX/VISTARIL) 10 MG tablet Take 1 tablet (10 mg total) by mouth every 4 (four) hours as needed for itching.  60 tablet  0  . Multiple Vitamin (MULTIVITAMIN) capsule Take 1 capsule by mouth daily.         No current facility-administered medications on file prior to visit.   Review of Systems  Constitutional: Negative for unexpected weight change, or unusual diaphoresis  HENT: Negative for tinnitus.   Eyes: Negative for photophobia and visual disturbance.  Respiratory: Negative for choking and stridor.   Gastrointestinal: Negative for vomiting and blood in stool.  Genitourinary: Negative for hematuria and decreased urine volume.  Musculoskeletal: Negative for acute joint swelling Skin: Negative for color change and wound.  Neurological: Negative for tremors and numbness other than noted  Psychiatric/Behavioral: Negative for decreased concentration or  hyperactivity.       Objective:   Physical Exam BP 134/80  Pulse 60  Temp(Src) 97.7 F (36.5 C) (Oral)  Ht 5\' 3"  (1.6 m)  Wt 123 lb (55.792 kg)  BMI 21.79 kg/m2  SpO2 97% VS noted, appears in pain, ? Mild ill as well Constitutional: Pt appears well-developed and well-nourished.  HENT: Head: NCAT.  Right Ear: External ear normal.  Left Ear: External ear normal.  Bilat tm's with mild erythema.  Max  sinus areas non tender.  Pharynx with mild erythema, no exudate Eyes: Conjunctivae and EOM are normal. Pupils are equal, round, and reactive to light.  Neck: Normal range of motion. Neck supple.  Cardiovascular: Normal rate and regular rhythm.   Pulmonary/Chest: Effort normal and breath sounds normal.  Spine: nontender Right neck lateral mid and lower tender adn trapezoid tender Neurological: Pt is alert. Not confused , motor/sens/dtrs intact to UE's, gait intact Skin: Skin is warm. No erythema.  Psychiatric: Pt behavior is normal. Thought content normal. not depressed affect    Assessment & Plan:

## 2012-11-29 NOTE — Patient Instructions (Signed)
You had the pain shot today (toradol) Please take all new medication as prescribed- the antibiotic, pain medication (tramadol), muscle relaxer (flexeril) and prednisone Please no heavy lifting or long distance driving for now Please call by Monday July 14 if not improved, as you may need to have MRI, and consider orthopedic referral  Please remember to sign up for My Chart if you have not done so, as this will be important to you in the future with finding out test results, communicating by private email, and scheduling acute appointments online when needed.

## 2013-01-07 ENCOUNTER — Encounter: Payer: Self-pay | Admitting: Internal Medicine

## 2013-01-07 ENCOUNTER — Ambulatory Visit (INDEPENDENT_AMBULATORY_CARE_PROVIDER_SITE_OTHER): Payer: 59 | Admitting: Internal Medicine

## 2013-01-07 VITALS — BP 108/72 | HR 62 | Temp 97.0°F | Wt 124.0 lb

## 2013-01-07 DIAGNOSIS — IMO0001 Reserved for inherently not codable concepts without codable children: Secondary | ICD-10-CM

## 2013-01-07 DIAGNOSIS — M7989 Other specified soft tissue disorders: Secondary | ICD-10-CM

## 2013-01-07 NOTE — Progress Notes (Signed)
  Subjective:    Patient ID: Beverly Shaw, female    DOB: 05/07/1951, 62 y.o.   MRN: [id number]  HPI  Here with 1 mo middle finger DIP red/swelling with mild tender after working with rose bushes, seems certain she was stuck at least twice to the general area, thinks she removed the thorns.  No open wounds, red streaks, fever and no prior hx or similar. Past Medical History  Diagnosis Date  . Migraines   . Positive TB test     Positive TB skin test  . GERD (gastroesophageal reflux disease)   . Heart murmur   . Episodic low back pain   . Osteopenia 10/15/2010  . Osteoporosis, unspecified 10/19/2012   Past Surgical History  Procedure Laterality Date  . Abdominal hysterectomy  1996  . Tubal ligation      reports that she has never smoked. She has never used smokeless tobacco. She reports that  drinks alcohol. She reports that she does not use illicit drugs. family history includes Diabetes in her other; Heart disease in her father and mother. No Known Allergies Current Outpatient Prescriptions on File Prior to Visit  Medication Sig Dispense Refill  . alendronate (FOSAMAX) 70 MG tablet Take 1 tablet (70 mg total) by mouth every 7 (seven) days. Take with a full glass of water on an empty stomach.  12 tablet  3  . calcium carbonate (OS-CAL) 600 MG TABS Take 600 mg by mouth daily.        . cyclobenzaprine (FLEXERIL) 5 MG tablet Take 1 tablet (5 mg total) by mouth 3 (three) times daily as needed for muscle spasms.  60 tablet  1  . estradiol (VIVELLE-DOT) 0.075 MG/24HR Place 1 patch onto the skin 2 (two) times a week.      . hydrOXYzine (ATARAX/VISTARIL) 10 MG tablet Take 1 tablet (10 mg total) by mouth every 4 (four) hours as needed for itching.  60 tablet  0  . Multiple Vitamin (MULTIVITAMIN) capsule Take 1 capsule by mouth daily.        . traMADol (ULTRAM) 50 MG tablet Take 1 tablet (50 mg total) by mouth every 6 (six) hours as needed for pain.  120 tablet  1   No current facility-administered  medications on file prior to visit.   Review of Systems All otherwise neg per pt     Objective:   Physical Exam BP 108/72  Pulse 62  Temp(Src) 97 F (36.1 C) (Oral)  Wt 124 lb (56.246 kg)  BMI 21.97 kg/m2  SpO2 98% VS noted, not ill appearing Constitutional: Pt appears well-developed and well-nourished.  HENT: Head: NCAT.  Right Ear: External ear normal.  Left Ear: External ear normal.  Eyes: Conjunctivae and EOM are normal. Pupils are equal, round, and reactive to light.  Neck: Normal range of motion. Neck supple.  Cardiovascular: Normal rate and regular rhythm.   Pulmonary/Chest: Effort normal and breath sounds normal.  Neurological: Pt is alert. Not confused  Skin: Skin is warm. No erythema. except for right hand middle finger DIP pink-red spongy minor tender swelling without red streaks or other finger swelling; has some decreased ROM Psychiatric: Pt behavior is normal. Thought content normal.     Assessment & Plan:

## 2013-01-07 NOTE — Patient Instructions (Signed)
Please try warm soapy soaks with antibacterial soap (OTC) three times per day and massage the area that is swollen, and possibly the thorn that might still be present may work itself out  Please continue all other medications as before (no new medication today)  You will be contacted regarding the referral for: Hand Surgury - Dr Mina Marble - for tues or thur this week  (to see [facility] now)

## 2013-01-07 NOTE — Assessment & Plan Note (Addendum)
?   Sporotrichosis infection with rose bush thorn 1 mo ago, vs cyst; for hand surgury as ideally would need aspirate/cx before committing to itraconazole 200 qd for 3- 6 mo;  She is leaving town in 10 day, will try to get in before that  Add:  Spoke to Dr Dwana Curd surgury - for OV soon with him, for soapy water soaks daily with some manipulation of the DIP area in case the main problem is retained thorn to try to work it out

## 2013-08-01 ENCOUNTER — Encounter: Payer: Self-pay | Admitting: Internal Medicine

## 2013-08-01 ENCOUNTER — Ambulatory Visit (INDEPENDENT_AMBULATORY_CARE_PROVIDER_SITE_OTHER): Payer: 59 | Admitting: Internal Medicine

## 2013-08-01 VITALS — BP 132/80 | HR 61 | Temp 97.4°F | Ht 63.0 in | Wt 123.5 lb

## 2013-08-01 DIAGNOSIS — S61209A Unspecified open wound of unspecified finger without damage to nail, initial encounter: Secondary | ICD-10-CM

## 2013-08-01 DIAGNOSIS — S61259A Open bite of unspecified finger without damage to nail, initial encounter: Secondary | ICD-10-CM | POA: Insufficient documentation

## 2013-08-01 DIAGNOSIS — W540XXA Bitten by dog, initial encounter: Secondary | ICD-10-CM

## 2013-08-01 MED ORDER — AMOXICILLIN-POT CLAVULANATE 875-125 MG PO TABS: 1.0000 | ORAL_TABLET | Freq: Two times a day (BID) | ORAL | Status: DC

## 2013-08-01 NOTE — Progress Notes (Signed)
   Subjective:    Patient ID: Beverly Shaw, female    DOB: 1950-06-18, 63 y.o.   MRN: [id number]  HPI  Here with right thumb red/tedner/swelling after dog bite 3 days ago with 2 puncture wounds, despite attention with washing, triple antibx, bandaid.  No fever but redness gradually worse especially in last 24 hrs.  Pt denies chest pain, increased sob or doe, wheezing, orthopnea, PND, increased LE swelling, palpitations, dizziness or syncope.   Pt denies fever, wt loss, night sweats, loss of appetite, or other constitutional symptoms Past Medical History  Diagnosis Date  . Migraines   . Positive TB test     Positive TB skin test  . GERD (gastroesophageal reflux disease)   . Heart murmur   . Episodic low back pain   . Osteopenia 10/15/2010  . Osteoporosis, unspecified 10/19/2012   Past Surgical History  Procedure Laterality Date  . Abdominal hysterectomy  1996  . Tubal ligation      reports that she has never smoked. She has never used smokeless tobacco. She reports that she drinks alcohol. She reports that she does not use illicit drugs. family history includes Diabetes in her other; Heart disease in her father and mother. No Known Allergies Current Outpatient Prescriptions on File Prior to Visit  Medication Sig Dispense Refill  . alendronate (FOSAMAX) 70 MG tablet Take 1 tablet (70 mg total) by mouth every 7 (seven) days. Take with a full glass of water on an empty stomach.  12 tablet  3  . calcium carbonate (OS-CAL) 600 MG TABS Take 600 mg by mouth daily.        Marland Kitchen estradiol (VIVELLE-DOT) 0.075 MG/24HR Place 1 patch onto the skin 2 (two) times a week.      . Multiple Vitamin (MULTIVITAMIN) capsule Take 1 capsule by mouth daily.         No current facility-administered medications on file prior to visit.   Review of Systems All otherwise neg per pt     Objective:   Physical Exam BP 132/80  Pulse 61  Temp(Src) 97.4 F (36.3 C) (Oral)  Ht 5\' 3"  (1.6 m)  Wt 123 lb 8 oz (56.019 kg)   BMI 21.88 kg/m2  SpO2 98% VS noted,  Constitutional: Pt appears well-developed and well-nourished.  HENT: Head: NCAT.  Eyes: Conjunctivae and EOM are normal. Pupils are equal, round, and reactive to light.  Neck: Normal range of motion. Neck supple.  Cardiovascular: Normal rate and regular rhythm.   Pulmonary/Chest: Effort normal and breath sounds normal.  Neurological: Pt is alert. Not confused  Skin: post right thumb with 1.5 cm area red.tender/swelling between thumb end and first joint without ulcer, fluctuance or drainage  Psychiatric: Pt behavior is normal. Thought content normal.     Assessment & Plan:

## 2013-08-01 NOTE — Assessment & Plan Note (Signed)
With mild cellulitis, for augmentin bid ,  to f/u any worsening symptoms or concerns

## 2013-08-01 NOTE — Progress Notes (Signed)
Pre visit review using our clinic review tool, if applicable. No additional management support is needed unless otherwise documented below in the visit note. 

## 2013-08-01 NOTE — Patient Instructions (Signed)
Please take all new medication as prescribed Please continue all other medications as before, and refills have been done if requested. Please have the pharmacy call with any other refills you may need.  Please remember to sign up for My Chart if you have not done so, as this will be important to you in the future with finding out test results, communicating by private email, and scheduling acute appointments online when needed.   

## 2013-10-10 ENCOUNTER — Other Ambulatory Visit: Payer: Self-pay | Admitting: Internal Medicine

## 2013-10-16 ENCOUNTER — Other Ambulatory Visit (INDEPENDENT_AMBULATORY_CARE_PROVIDER_SITE_OTHER): Payer: 59

## 2013-10-16 DIAGNOSIS — R7302 Impaired glucose tolerance (oral): Secondary | ICD-10-CM

## 2013-10-16 DIAGNOSIS — Z0000 Encounter for general adult medical examination without abnormal findings: Secondary | ICD-10-CM

## 2013-10-16 DIAGNOSIS — R7309 Other abnormal glucose: Secondary | ICD-10-CM

## 2013-10-16 LAB — BASIC METABOLIC PANEL
BUN: 17 mg/dL (ref 6–23)
CO2: 30 mEq/L (ref 19–32)
Calcium: 8.9 mg/dL (ref 8.4–10.5)
Chloride: 108 mEq/L (ref 96–112)
Creatinine, Ser: 0.7 mg/dL (ref 0.4–1.2)
GFR: 87.43 mL/min (ref >60.00)
GLUCOSE: 76 mg/dL (ref 70–99)
POTASSIUM: 4.4 meq/L (ref 3.5–5.1)
SODIUM: 143 meq/L (ref 135–145)

## 2013-10-16 LAB — URINALYSIS, ROUTINE W REFLEX MICROSCOPIC
BILIRUBIN URINE: NEGATIVE
KETONES UR: NEGATIVE
Leukocytes, UA: NEGATIVE
Nitrite: NEGATIVE
PH: 6 (ref 5.0–8.0)
SPECIFIC GRAVITY, URINE: 1.025 (ref 1.000–1.030)
Total Protein, Urine: NEGATIVE
URINE GLUCOSE: NEGATIVE
UROBILINOGEN UA: 0.2 (ref 0.0–1.0)

## 2013-10-16 LAB — CBC WITH DIFFERENTIAL/PLATELET
Basophils Absolute: 0 10*3/uL (ref 0.0–0.1)
Basophils Relative: 0.4 % (ref 0.0–3.0)
Eosinophils Absolute: 0.1 10*3/uL (ref 0.0–0.7)
Eosinophils Relative: 1.6 % (ref 0.0–5.0)
HEMATOCRIT: 39.5 % (ref 36.0–46.0)
HEMOGLOBIN: 13.3 g/dL (ref 12.0–15.0)
LYMPHS ABS: 1.7 10*3/uL (ref 0.7–4.0)
Lymphocytes Relative: 32.1 % (ref 12.0–46.0)
MCHC: 33.6 g/dL (ref 30.0–36.0)
MCV: 89.6 fl (ref 78.0–100.0)
MONO ABS: 0.5 10*3/uL (ref 0.1–1.0)
MONOS PCT: 9 % (ref 3.0–12.0)
NEUTROS ABS: 2.9 10*3/uL (ref 1.4–7.7)
Neutrophils Relative %: 56.9 % (ref 43.0–77.0)
PLATELETS: 194 10*3/uL (ref 150.0–400.0)
RBC: 4.4 Mil/uL (ref 3.87–5.11)
RDW: 13.4 % (ref 11.5–15.5)
WBC: 5.2 10*3/uL (ref 4.0–10.5)

## 2013-10-16 LAB — HEPATIC FUNCTION PANEL
ALBUMIN: 3.7 g/dL (ref 3.5–5.2)
ALT: 23 U/L (ref 0–35)
AST: 23 U/L (ref 0–37)
Alkaline Phosphatase: 33 U/L — ABNORMAL LOW (ref 39–117)
BILIRUBIN TOTAL: 1 mg/dL (ref 0.2–1.2)
Bilirubin, Direct: 0.1 mg/dL (ref 0.0–0.3)
Total Protein: 6.1 g/dL (ref 6.0–8.3)

## 2013-10-16 LAB — HEMOGLOBIN A1C: Hgb A1c MFr Bld: 5.4 % (ref 4.6–6.5)

## 2013-10-16 LAB — LIPID PANEL
Cholesterol: 192 mg/dL (ref 0–200)
HDL: 49 mg/dL (ref >39.00)
LDL Cholesterol: 113 mg/dL — ABNORMAL HIGH (ref 0–99)
TRIGLYCERIDES: 149 mg/dL (ref 0.0–149.0)
Total CHOL/HDL Ratio: 4
VLDL: 29.8 mg/dL (ref 0.0–40.0)

## 2013-10-16 LAB — TSH: TSH: 1.03 u[IU]/mL (ref 0.35–4.50)

## 2013-10-22 ENCOUNTER — Ambulatory Visit (INDEPENDENT_AMBULATORY_CARE_PROVIDER_SITE_OTHER): Payer: 59 | Admitting: Internal Medicine

## 2013-10-22 ENCOUNTER — Encounter: Payer: Self-pay | Admitting: Internal Medicine

## 2013-10-22 ENCOUNTER — Encounter: Payer: 59 | Admitting: Internal Medicine

## 2013-10-22 VITALS — BP 130/80 | HR 62 | Temp 98.0°F | Resp 16 | Ht 63.0 in | Wt 122.0 lb

## 2013-10-22 DIAGNOSIS — Z1231 Encounter for screening mammogram for malignant neoplasm of breast: Secondary | ICD-10-CM | POA: Insufficient documentation

## 2013-10-22 DIAGNOSIS — G44019 Episodic cluster headache, not intractable: Secondary | ICD-10-CM

## 2013-10-22 DIAGNOSIS — M810 Age-related osteoporosis without current pathological fracture: Secondary | ICD-10-CM

## 2013-10-22 DIAGNOSIS — Z0000 Encounter for general adult medical examination without abnormal findings: Secondary | ICD-10-CM

## 2013-10-22 MED ORDER — SUMATRIPTAN-NAPROXEN SODIUM 85-500 MG PO TABS: 1.0000 | ORAL_TABLET | ORAL | Status: DC | PRN

## 2013-10-22 NOTE — Progress Notes (Signed)
Pre visit review using our clinic review tool, if applicable. No additional management support is needed unless otherwise documented below in the visit note. 

## 2013-10-22 NOTE — Assessment & Plan Note (Signed)
She will try treximet for this

## 2013-10-22 NOTE — Assessment & Plan Note (Signed)
She is due for a f/up DEXA scan 

## 2013-10-22 NOTE — Patient Instructions (Signed)
Preventive Care for Adults, Female A healthy lifestyle and preventive care can promote health and wellness. Preventive health guidelines for women include the following key practices.  A routine yearly physical is a good way to check with your health care provider about your health and preventive screening. It is a chance to share any concerns and updates on your health and to receive a thorough exam.  Visit your dentist for a routine exam and preventive care every 6 months. Brush your teeth twice a day and floss once a day. Good oral hygiene prevents tooth decay and gum disease.  The frequency of eye exams is based on your age, health, family medical history, use of contact lenses, and other factors. Follow your health care provider's recommendations for frequency of eye exams.  Eat a healthy diet. Foods like vegetables, fruits, whole grains, low-fat dairy products, and lean protein foods contain the nutrients you need without too many calories. Decrease your intake of foods high in solid fats, added sugars, and salt. Eat the right amount of calories for you.Get information about a proper diet from your health care provider, if necessary.  Regular physical exercise is one of the most important things you can do for your health. Most adults should get at least 150 minutes of moderate-intensity exercise (any activity that increases your heart rate and causes you to sweat) each week. In addition, most adults need muscle-strengthening exercises on 2 or more days a week.  Maintain a healthy weight. The body mass index (BMI) is a screening tool to identify possible weight problems. It provides an estimate of body fat based on height and weight. Your health care provider can find your BMI, and can help you achieve or maintain a healthy weight.For adults 20 years and older:  A BMI below 18.5 is considered underweight.  A BMI of 18.5 to 24.9 is normal.  A BMI of 25 to 29.9 is considered overweight.  A  BMI of 30 and above is considered obese.  Maintain normal blood lipids and cholesterol levels by exercising and minimizing your intake of saturated fat. Eat a balanced diet with plenty of fruit and vegetables. Blood tests for lipids and cholesterol should begin at age 62 and be repeated every 5 years. If your lipid or cholesterol levels are high, you are over 50, or you are at high risk for heart disease, you may need your cholesterol levels checked more frequently.Ongoing high lipid and cholesterol levels should be treated with medicines if diet and exercise are not working.  If you smoke, find out from your health care provider how to quit. If you do not use tobacco, do not start.  Lung cancer screening is recommended for adults aged 36 80 years who are at high risk for developing lung cancer because of a history of smoking. A yearly low-dose CT scan of the lungs is recommended for people who have at least a 30-pack-year history of smoking and are a current smoker or have quit within the past 15 years. A pack year of smoking is smoking an average of 1 pack of cigarettes a day for 1 year (for example: 1 pack a day for 30 years or 2 packs a day for 15 years). Yearly screening should continue until the smoker has stopped smoking for at least 15 years. Yearly screening should be stopped for people who develop a health problem that would prevent them from having lung cancer treatment.  If you are pregnant, do not drink alcohol. If you  are breastfeeding, be very cautious about drinking alcohol. If you are not pregnant and choose to drink alcohol, do not have more than 1 drink per day. One drink is considered to be 12 ounces (355 mL) of beer, 5 ounces (148 mL) of wine, or 1.5 ounces (44 mL) of liquor.  Avoid use of street drugs. Do not share needles with anyone. Ask for help if you need support or instructions about stopping the use of drugs.  High blood pressure causes heart disease and increases the risk  of stroke. Your blood pressure should be checked at least every 1 to 2 years. Ongoing high blood pressure should be treated with medicines if weight loss and exercise do not work.  If you are 39 61 years old, ask your health care provider if you should take aspirin to prevent strokes.  Diabetes screening involves taking a blood sample to check your fasting blood sugar level. This should be done once every 3 years, after age 56, if you are within normal weight and without risk factors for diabetes. Testing should be considered at a younger age or be carried out more frequently if you are overweight and have at least 1 risk factor for diabetes.  Breast cancer screening is essential preventive care for women. You should practice "breast self-awareness." This means understanding the normal appearance and feel of your breasts and may include breast self-examination. Any changes detected, no matter how small, should be reported to a health care provider. Women in their 40s and 30s should have a clinical breast exam (CBE) by a health care provider as part of a regular health exam every 1 to 3 years. After age 28, women should have a CBE every year. Starting at age 72, women should consider having a mammogram (breast X-ray test) every year. Women who have a family history of breast cancer should talk to their health care provider about genetic screening. Women at a high risk of breast cancer should talk to their health care providers about having an MRI and a mammogram every year.  Breast cancer gene (BRCA)-related cancer risk assessment is recommended for women who have family members with BRCA-related cancers. BRCA-related cancers include breast, ovarian, tubal, and peritoneal cancers. Having family members with these cancers may be associated with an increased risk for harmful changes (mutations) in the breast cancer genes BRCA1 and BRCA2. Results of the assessment will determine the need for genetic counseling  and BRCA1 and BRCA2 testing.  The Pap test is a screening test for cervical cancer. A Pap test can show cell changes on the cervix that might become cervical cancer if left untreated. A Pap test is a procedure in which cells are obtained and examined from the lower end of the uterus (cervix).  Women should have a Pap test starting at age 59.  Between ages 42 and 13, Pap tests should be repeated every 2 years.  Beginning at age 53, you should have a Pap test every 3 years as long as the past 3 Pap tests have been normal.  Some women have medical problems that increase the chance of getting cervical cancer. Talk to your health care provider about these problems. It is especially important to talk to your health care provider if a new problem develops soon after your last Pap test. In these cases, your health care provider may recommend more frequent screening and Pap tests.  The above recommendations are the same for women who have or have not gotten the vaccine  for human papillomavirus (HPV).  If you had a hysterectomy for a problem that was not cancer or a condition that could lead to cancer, then you no longer need Pap tests. Even if you no longer need a Pap test, a regular exam is a good idea to make sure no other problems are starting.  If you are between ages 58 and 10 years, and you have had normal Pap tests going back 10 years, you no longer need Pap tests. Even if you no longer need a Pap test, a regular exam is a good idea to make sure no other problems are starting.  If you have had past treatment for cervical cancer or a condition that could lead to cancer, you need Pap tests and screening for cancer for at least 20 years after your treatment.  If Pap tests have been discontinued, risk factors (such as a new sexual partner) need to be reassessed to determine if screening should be resumed.  The HPV test is an additional test that may be used for cervical cancer screening. The HPV test  looks for the virus that can cause the cell changes on the cervix. The cells collected during the Pap test can be tested for HPV. The HPV test could be used to screen women aged 67 years and older, and should be used in women of any age who have unclear Pap test results. After the age of 65, women should have HPV testing at the same frequency as a Pap test.  Colorectal cancer can be detected and often prevented. Most routine colorectal cancer screening begins at the age of 25 years and continues through age 66 years. However, your health care provider may recommend screening at an earlier age if you have risk factors for colon cancer. On a yearly basis, your health care provider may provide home test kits to check for hidden blood in the stool. Use of a small camera at the end of a tube, to directly examine the colon (sigmoidoscopy or colonoscopy), can detect the earliest forms of colorectal cancer. Talk to your health care provider about this at age 79, when routine screening begins. Direct exam of the colon should be repeated every 5 10 years through age 47 years, unless early forms of pre-cancerous polyps or small growths are found.  People who are at an increased risk for hepatitis B should be screened for this virus. You are considered at high risk for hepatitis B if:  You were born in a country where hepatitis B occurs often. Talk with your health care provider about which countries are considered high risk.  Your parents were born in a high-risk country and you have not received a shot to protect against hepatitis B (hepatitis B vaccine).  You have HIV or AIDS.  You use needles to inject street drugs.  You live with, or have sex with, someone who has Hepatitis B.  You get hemodialysis treatment.  You take certain medicines for conditions like cancer, organ transplantation, and autoimmune conditions.  Hepatitis C blood testing is recommended for all people born from [birth year] through 1965 and  any individual with known risks for hepatitis C.  Practice safe sex. Use condoms and avoid high-risk sexual practices to reduce the spread of sexually transmitted infections (STIs). STIs include gonorrhea, chlamydia, syphilis, trichomonas, herpes, HPV, and human immunodeficiency virus (HIV). Herpes, HIV, and HPV are viral illnesses that have no cure. They can result in disability, cancer, and death. Sexually active women aged 66  years and younger should be checked for chlamydia. Older women with new or multiple partners should also be tested for chlamydia. Testing for other STIs is recommended if you are sexually active and at increased risk.  Osteoporosis is a disease in which the bones lose minerals and strength with aging. This can result in serious bone fractures or breaks. The risk of osteoporosis can be identified using a bone density scan. Women ages 18 years and over and women at risk for fractures or osteoporosis should discuss screening with their health care providers. Ask your health care provider whether you should take a calcium supplement or vitamin D to reduce the rate of osteoporosis.  Menopause can be associated with physical symptoms and risks. Hormone replacement therapy is available to decrease symptoms and risks. You should talk to your health care provider about whether hormone replacement therapy is right for you.  Use sunscreen. Apply sunscreen liberally and repeatedly throughout the day. You should seek shade when your shadow is shorter than you. Protect yourself by wearing long sleeves, pants, a wide-brimmed hat, and sunglasses year round, whenever you are outdoors.  Once a month, do a whole body skin exam, using a mirror to look at the skin on your back. Tell your health care provider of new moles, moles that have irregular borders, moles that are larger than a pencil eraser, or moles that have changed in shape or color.  Stay current with required vaccines  (immunizations).  Influenza vaccine. All adults should be immunized every year.  Tetanus, diphtheria, and acellular pertussis (Td, Tdap) vaccine. Pregnant women should receive 1 dose of Tdap vaccine during each pregnancy. The dose should be obtained regardless of the length of time since the last dose. Immunization is preferred during the 27th 36th week of gestation. An adult who has not previously received Tdap or who does not know her vaccine status should receive 1 dose of Tdap. This initial dose should be followed by tetanus and diphtheria toxoids (Td) booster doses every 10 years. Adults with an unknown or incomplete history of completing a 3-dose immunization series with Td-containing vaccines should begin or complete a primary immunization series including a Tdap dose. Adults should receive a Td booster every 10 years.  Varicella vaccine. An adult without evidence of immunity to varicella should receive 2 doses or a second dose if she has previously received 1 dose. Pregnant females who do not have evidence of immunity should receive the first dose after pregnancy. This first dose should be obtained before leaving the health care facility. The second dose should be obtained 4 8 weeks after the first dose.  Human papillomavirus (HPV) vaccine. Females aged 9 26 years who have not received the vaccine previously should obtain the 3-dose series. The vaccine is not recommended for use in pregnant females. However, pregnancy testing is not needed before receiving a dose. If a female is found to be pregnant after receiving a dose, no treatment is needed. In that case, the remaining doses should be delayed until after the pregnancy. Immunization is recommended for any person with an immunocompromised condition through the age of 51 years if she did not get any or all doses earlier. During the 3-dose series, the second dose should be obtained 4 8 weeks after the first dose. The third dose should be obtained  24 weeks after the first dose and 16 weeks after the second dose.  Zoster vaccine. One dose is recommended for adults aged 57 years or older unless certain  conditions are present.  Measles, mumps, and rubella (MMR) vaccine. Adults born before [birth year] generally are considered immune to measles and mumps. Adults born in [birth year] or later should have 1 or more doses of MMR vaccine unless there is a contraindication to the vaccine or there is laboratory evidence of immunity to each of the three diseases. A routine second dose of MMR vaccine should be obtained at least 28 days after the first dose for students attending postsecondary schools, health care workers, or international travelers. People who received inactivated measles vaccine or an unknown type of measles vaccine during 1963 1967 should receive 2 doses of MMR vaccine. People who received inactivated mumps vaccine or an unknown type of mumps vaccine before 1979 and are at high risk for mumps infection should consider immunization with 2 doses of MMR vaccine. For females of childbearing age, rubella immunity should be determined. If there is no evidence of immunity, females who are not pregnant should be vaccinated. If there is no evidence of immunity, females who are pregnant should delay immunization until after pregnancy. Unvaccinated health care workers born before [birth year] who lack laboratory evidence of measles, mumps, or rubella immunity or laboratory confirmation of disease should consider measles and mumps immunization with 2 doses of MMR vaccine or rubella immunization with 1 dose of MMR vaccine.  Pneumococcal 13-valent conjugate (PCV13) vaccine. When indicated, a person who is uncertain of her immunization history and has no record of immunization should receive the PCV13 vaccine. An adult aged 42 years or older who has certain medical conditions and has not been previously immunized should receive 1 dose of PCV13 vaccine. This PCV13 should be followed  with a dose of pneumococcal polysaccharide (PPSV23) vaccine. The PPSV23 vaccine dose should be obtained at least 8 weeks after the dose of PCV13 vaccine. An adult aged 4 years or older who has certain medical conditions and previously received 1 or more doses of PPSV23 vaccine should receive 1 dose of PCV13. The PCV13 vaccine dose should be obtained 1 or more years after the last PPSV23 vaccine dose.  Pneumococcal polysaccharide (PPSV23) vaccine. When PCV13 is also indicated, PCV13 should be obtained first. All adults aged 27 years and older should be immunized. An adult younger than age 33 years who has certain medical conditions should be immunized. Any person who resides in a nursing home or long-term care facility should be immunized. An adult smoker should be immunized. People with an immunocompromised condition and certain other conditions should receive both PCV13 and PPSV23 vaccines. People with human immunodeficiency virus (HIV) infection should be immunized as soon as possible after diagnosis. Immunization during chemotherapy or radiation therapy should be avoided. Routine use of PPSV23 vaccine is not recommended for American Indians, [place] Natives, or people younger than 65 years unless there are medical conditions that require PPSV23 vaccine. When indicated, people who have unknown immunization and have no record of immunization should receive PPSV23 vaccine. One-time revaccination 5 years after the first dose of PPSV23 is recommended for people aged 13 64 years who have chronic kidney failure, nephrotic syndrome, asplenia, or immunocompromised conditions. People who received 1 2 doses of PPSV23 before age 66 years should receive another dose of PPSV23 vaccine at age 27 years or later if at least 5 years have passed since the previous dose. Doses of PPSV23 are not needed for people immunized with PPSV23 at or after age 33 years.  Meningococcal vaccine. Adults with asplenia or persistent complement  component deficiencies should receive 2  doses of quadrivalent meningococcal conjugate (MenACWY-D) vaccine. The doses should be obtained at least 2 months apart. Microbiologists working with certain meningococcal bacteria, [place] recruits, people at risk during an outbreak, and people who travel to or live in countries with a high rate of meningitis should be immunized. A first-year college student up through age 49 years who is living in a residence hall should receive a dose if she did not receive a dose on or after her 16th birthday. Adults who have certain high-risk conditions should receive one or more doses of vaccine.  Hepatitis A vaccine. Adults who wish to be protected from this disease, have certain high-risk conditions, work with hepatitis A-infected animals, work in hepatitis A research labs, or travel to or work in countries with a high rate of hepatitis A should be immunized. Adults who were previously unvaccinated and who anticipate close contact with an international adoptee during the first 60 days after arrival in the [place] from a country with a high rate of hepatitis A should be immunized.  Hepatitis B vaccine. Adults who wish to be protected from this disease, have certain high-risk conditions, may be exposed to blood or other infectious body fluids, are household contacts or sex partners of hepatitis B positive people, are clients or workers in certain care facilities, or travel to or work in countries with a high rate of hepatitis B should be immunized.  Haemophilus influenzae type b (Hib) vaccine. A previously unvaccinated person with asplenia or sickle cell disease or having a scheduled splenectomy should receive 1 dose of Hib vaccine. Regardless of previous immunization, a recipient of a hematopoietic stem cell transplant should receive a 3-dose series 6 12 months after her successful transplant. Hib vaccine is not recommended for adults with HIV infection. Preventive  Services / Frequency Ages 24 to 39years  Blood pressure check.** / Every 1 to 2 years.  Lipid and cholesterol check.** / Every 5 years beginning at age 66.  Clinical breast exam.** / Every 3 years for women in their 12s and 24s.  BRCA-related cancer risk assessment.** / For women who have family members with a BRCA-related cancer (breast, ovarian, tubal, or peritoneal cancers).  Pap test.** / Every 2 years from ages 31 through 69. Every 3 years starting at age 64 through age 76 or 89 with a history of 3 consecutive normal Pap tests.  HPV screening.** / Every 3 years from ages 10 through ages 10 to 96 with a history of 3 consecutive normal Pap tests.  Hepatitis C blood test.** / For any individual with known risks for hepatitis C.  Skin self-exam. / Monthly.  Influenza vaccine. / Every year.  Tetanus, diphtheria, and acellular pertussis (Tdap, Td) vaccine.** / Consult your health care provider. Pregnant women should receive 1 dose of Tdap vaccine during each pregnancy. 1 dose of Td every 10 years.  Varicella vaccine.** / Consult your health care provider. Pregnant females who do not have evidence of immunity should receive the first dose after pregnancy.  HPV vaccine. / 3 doses over 6 months, if 90 and younger. The vaccine is not recommended for use in pregnant females. However, pregnancy testing is not needed before receiving a dose.  Measles, mumps, rubella (MMR) vaccine.** / You need at least 1 dose of MMR if you were born in 1957 or later. You may also need a 2nd dose. For females of childbearing age, rubella immunity should be determined. If there is no evidence of immunity, females who are not  pregnant should be vaccinated. If there is no evidence of immunity, females who are pregnant should delay immunization until after pregnancy.  Pneumococcal 13-valent conjugate (PCV13) vaccine.** / Consult your health care provider.  Pneumococcal polysaccharide (PPSV23) vaccine.** / 1 to 2  doses if you smoke cigarettes or if you have certain conditions.  Meningococcal vaccine.** / 1 dose if you are age 88 to 6 years and a Market researcher living in a residence hall, or have one of several medical conditions, you need to get vaccinated against meningococcal disease. You may also need additional booster doses.  Hepatitis A vaccine.** / Consult your health care provider.  Hepatitis B vaccine.** / Consult your health care provider.  Haemophilus influenzae type b (Hib) vaccine.** / Consult your health care provider. Ages 23 to 64years  Blood pressure check.** / Every 1 to 2 years.  Lipid and cholesterol check.** / Every 5 years beginning at age 20 years.  Lung cancer screening. / Every year if you are aged 51 80 years and have a 30-pack-year history of smoking and currently smoke or have quit within the past 15 years. Yearly screening is stopped once you have quit smoking for at least 15 years or develop a health problem that would prevent you from having lung cancer treatment.  Clinical breast exam.** / Every year after age 8 years.  BRCA-related cancer risk assessment.** / For women who have family members with a BRCA-related cancer (breast, ovarian, tubal, or peritoneal cancers).  Mammogram.** / Every year beginning at age 10 years and continuing for as long as you are in good health. Consult with your health care provider.  Pap test.** / Every 3 years starting at age 30 years through age 5 or 61 years with a history of 3 consecutive normal Pap tests.  HPV screening.** / Every 3 years from ages 39 years through ages 72 to 19 years with a history of 3 consecutive normal Pap tests.  Fecal occult blood test (FOBT) of stool. / Every year beginning at age 59 years and continuing until age 27 years. You may not need to do this test if you get a colonoscopy every 10 years.  Flexible sigmoidoscopy or colonoscopy.** / Every 5 years for a flexible sigmoidoscopy or every  10 years for a colonoscopy beginning at age 110 years and continuing until age 63 years.  Hepatitis C blood test.** / For all people born from [birth year] through 1963 and any individual with known risks for hepatitis C.  Skin self-exam. / Monthly.  Influenza vaccine. / Every year.  Tetanus, diphtheria, and acellular pertussis (Tdap/Td) vaccine.** / Consult your health care provider. Pregnant women should receive 1 dose of Tdap vaccine during each pregnancy. 1 dose of Td every 10 years.  Varicella vaccine.** / Consult your health care provider. Pregnant females who do not have evidence of immunity should receive the first dose after pregnancy.  Zoster vaccine.** / 1 dose for adults aged 46 years or older.  Measles, mumps, rubella (MMR) vaccine.** / You need at least 1 dose of MMR if you were born in 1955 or later. You may also need a 2nd dose. For females of childbearing age, rubella immunity should be determined. If there is no evidence of immunity, females who are not pregnant should be vaccinated. If there is no evidence of immunity, females who are pregnant should delay immunization until after pregnancy.  Pneumococcal 13-valent conjugate (PCV13) vaccine.** / Consult your health care provider.  Pneumococcal polysaccharide (PPSV23) vaccine.** / 1  to 2 doses if you smoke cigarettes or if you have certain conditions.  Meningococcal vaccine.** / Consult your health care provider.  Hepatitis A vaccine.** / Consult your health care provider.  Hepatitis B vaccine.** / Consult your health care provider.  Haemophilus influenzae type b (Hib) vaccine.** / Consult your health care provider. Ages 44 years and over  Blood pressure check.** / Every 1 to 2 years.  Lipid and cholesterol check.** / Every 5 years beginning at age 74 years.  Lung cancer screening. / Every year if you are aged 53 80 years and have a 30-pack-year history of smoking and currently smoke or have quit within the past 15 years.  Yearly screening is stopped once you have quit smoking for at least 15 years or develop a health problem that would prevent you from having lung cancer treatment.  Clinical breast exam.** / Every year after age 21 years.  BRCA-related cancer risk assessment.** / For women who have family members with a BRCA-related cancer (breast, ovarian, tubal, or peritoneal cancers).  Mammogram.** / Every year beginning at age 35 years and continuing for as long as you are in good health. Consult with your health care provider.  Pap test.** / Every 3 years starting at age 89 years through age 39 or 66 years with 3 consecutive normal Pap tests. Testing can be stopped between 65 and 70 years with 3 consecutive normal Pap tests and no abnormal Pap or HPV tests in the past 10 years.  HPV screening.** / Every 3 years from ages 63 years through ages 92 or 28 years with a history of 3 consecutive normal Pap tests. Testing can be stopped between 65 and 70 years with 3 consecutive normal Pap tests and no abnormal Pap or HPV tests in the past 10 years.  Fecal occult blood test (FOBT) of stool. / Every year beginning at age 79 years and continuing until age 56 years. You may not need to do this test if you get a colonoscopy every 10 years.  Flexible sigmoidoscopy or colonoscopy.** / Every 5 years for a flexible sigmoidoscopy or every 10 years for a colonoscopy beginning at age 37 years and continuing until age 18 years.  Hepatitis C blood test.** / For all people born from [birth year] through 1963 and any individual with known risks for hepatitis C.  Osteoporosis screening.** / A one-time screening for women ages 76 years and over and women at risk for fractures or osteoporosis.  Skin self-exam. / Monthly.  Influenza vaccine. / Every year.  Tetanus, diphtheria, and acellular pertussis (Tdap/Td) vaccine.** / 1 dose of Td every 10 years.  Varicella vaccine.** / Consult your health care provider.  Zoster vaccine.** / 1  dose for adults aged 19 years or older.  Pneumococcal 13-valent conjugate (PCV13) vaccine.** / Consult your health care provider.  Pneumococcal polysaccharide (PPSV23) vaccine.** / 1 dose for all adults aged 66 years and older.  Meningococcal vaccine.** / Consult your health care provider.  Hepatitis A vaccine.** / Consult your health care provider.  Hepatitis B vaccine.** / Consult your health care provider.  Haemophilus influenzae type b (Hib) vaccine.** / Consult your health care provider. ** Family history and personal history of risk and conditions may change your health care provider's recommendations. Document Released: 07/05/2001 Document Revised: 02/27/2013 Document Reviewed: 10/04/2010 [facility] Patient Information 2014 [place], [place]. Cluster Headache Cluster headaches are recognized by their pattern of deep, intense head pain. They normally occur on one side of your head, but they  may "switch sides" in subsequent episodes. Typically, cluster headaches:   Are severe in nature.   Occur repeatedly over weeks to months and are followed by periods of no headaches.   Can last from 15 minutes to 3 hours.   Occur at the same time each day, often at night.   Occur several times a day. CAUSES The exact cause of cluster headaches is not known. Alcohol use may be associated with cluster headaches. SIGNS AND SYMPTOMS   Severe pain that begins in or around your eye or temple.   One-sided head pain.   Feeling sick to your stomach (nauseous).   Sensitivity to light.   Runny nose.   Eye redness, tearing, and nasal stuffiness on the side of your head where you are experiencing pain.   Sweaty, pale skin of the face.   Droopy or swollen eyelid.   Restlessness. DIAGNOSIS  Cluster headaches are diagnosed based on symptoms and a physical exam. Your health care provider may order a CT scan or an MRI of your head or lab tests to see if your headaches are caused by  other medical conditions.  TREATMENT   Medicines for pain relief and to prevent recurrent attacks. Some people may need a combination of medicines.  Oxygen for pain relief.   Biofeedback programs to help reduce headache pain.  It may be helpful to keep a headache diary. This may help you find a trend for what is triggering your headaches. Your health care provider can develop a treatment plan.  HOME CARE INSTRUCTIONS  During cluster periods:   Follow a regular sleep schedule. Do not vary the amount and time that you sleep from day to day. It is important to stay on the same schedule during a cluster period to help prevent headaches.   Avoid alcohol.   Stop smoking if you smoke.  SEEK MEDICAL CARE IF:  You have any changes from your previous cluster headaches either in intensity or frequency.   You are not getting relief from medicines you are taking.  SEEK IMMEDIATE MEDICAL CARE IF:   You faint.   You have weakness or numbness, especially on one side of your body or face.   You have double vision.   You have nausea or vomiting that is not relieved within several hours.   You cannot keep your balance or have difficulty talking or walking.   You have neck pain or stiffness.   You have a fever. MAKE SURE YOU:  Understand these instructions.   Will watch your condition.   Will get help right away if you are not doing well or get worse. Document Released: 05/09/2005 Document Revised: 02/27/2013 Document Reviewed: 11/29/2012 [facility] Patient Information 2014 [place].

## 2013-10-22 NOTE — Assessment & Plan Note (Signed)
Exam done Labs reviewed Vaccines reviewed She was referred for a mammo and colonoscopy

## 2013-10-22 NOTE — Progress Notes (Signed)
Subjective:    Patient ID: Beverly Shaw, female    DOB: March 18, 1951, 63 y.o.   MRN: [id number]  Headache  This is a recurrent problem. The current episode started more than 1 year ago (first headache was at age 39). The problem occurs intermittently (2 times per month). The problem has been unchanged. The pain is located in the right unilateral and retro-orbital region. The pain does not radiate. The pain quality is similar to prior headaches. The quality of the pain is described as throbbing. The pain is at a severity of 3/10. The pain is mild. Pertinent negatives include no abdominal pain, abnormal behavior, anorexia, back pain, blurred vision, coughing, dizziness, drainage, ear pain, eye pain, eye redness, eye watering, facial sweating, fever, hearing loss, insomnia, loss of balance, muscle aches, nausea, neck pain, numbness, phonophobia, photophobia, rhinorrhea, scalp tenderness, seizures, sinus pressure, sore throat, swollen glands, tingling, tinnitus, visual change, vomiting, weakness or weight loss. The symptoms are aggravated by emotional stress. She has tried NSAIDs and acetaminophen for the symptoms. The treatment provided moderate relief. Her past medical history is significant for cluster headaches, migraine headaches and migraines in the family. There is no history of cancer, hypertension, immunosuppression, obesity, pseudotumor cerebri, recent head traumas, sinus disease or TMJ.      Review of Systems  Constitutional: Negative.  Negative for fever, chills, weight loss, diaphoresis, appetite change and fatigue.  HENT: Negative for ear pain, hearing loss, rhinorrhea, sinus pressure, sore throat and tinnitus.   Eyes: Negative.  Negative for blurred vision, photophobia, pain, redness and visual disturbance.  Respiratory: Negative.  Negative for cough, choking, chest tightness, shortness of breath and stridor.   Cardiovascular: Negative.  Negative for chest pain, palpitations and leg  swelling.  Gastrointestinal: Negative for nausea, vomiting, abdominal pain and anorexia.  Endocrine: Negative.   Genitourinary: Negative.   Musculoskeletal: Negative.  Negative for back pain and neck pain.  Skin: Negative.  Negative for rash.  Allergic/Immunologic: Negative.   Neurological: Positive for headaches. Negative for dizziness, tingling, tremors, seizures, weakness, light-headedness, numbness and loss of balance.  Hematological: Negative.  Negative for adenopathy. Does not bruise/bleed easily.  Psychiatric/Behavioral: Negative.  The patient does not have insomnia.        Objective:   Physical Exam  Vitals reviewed. Constitutional: She is oriented to person, place, and time. She appears well-developed and well-nourished.  Non-toxic appearance. She does not have a sickly appearance. She does not appear ill. No distress.  HENT:  Head: Normocephalic and atraumatic.  Mouth/Throat: Oropharynx is clear and moist. No oropharyngeal exudate.  Eyes: Conjunctivae and EOM are normal. Pupils are equal, round, and reactive to light. Right eye exhibits no discharge. Left eye exhibits no discharge. No scleral icterus.  Neck: Normal range of motion. Neck supple. No JVD present. No tracheal deviation present. No thyromegaly present.  Cardiovascular: Normal rate, regular rhythm, normal heart sounds and intact distal pulses.  Exam reveals no gallop and no friction rub.   No murmur heard. Pulmonary/Chest: Effort normal and breath sounds normal. No stridor. No respiratory distress. She has no wheezes. She has no rales. She exhibits no tenderness.  Abdominal: Soft. Bowel sounds are normal. She exhibits no distension and no mass. There is no tenderness. There is no rebound and no guarding.  Musculoskeletal: Normal range of motion. She exhibits no edema.  Lymphadenopathy:    She has no cervical adenopathy.  Neurological: She is alert and oriented to person, place, and time. She has normal strength. She  displays no atrophy, no tremor and normal reflexes. No cranial nerve deficit or sensory deficit. She exhibits normal muscle tone. She displays a negative Romberg sign. She displays no seizure activity. Coordination and gait normal.  Reflex Scores:      Tricep reflexes are 0 on the right side and 0 on the left side.      Bicep reflexes are 0 on the right side and 0 on the left side.      Brachioradialis reflexes are 1+ on the right side and 1+ on the left side.      Patellar reflexes are 1+ on the right side and 1+ on the left side.      Achilles reflexes are 0 on the right side and 0 on the left side. Skin: Skin is warm and dry. No rash noted. She is not diaphoretic. No erythema. No pallor.  Psychiatric: She has a normal mood and affect. Her behavior is normal. Judgment and thought content normal.     Lab Results  Component Value Date   WBC 5.2 10/16/2013   HGB 13.3 10/16/2013   HCT 39.5 10/16/2013   PLT 194.0 10/16/2013   GLUCOSE 76 10/16/2013   CHOL 192 10/16/2013   TRIG 149.0 10/16/2013   HDL 49.00 10/16/2013   LDLDIRECT 109.1 10/12/2012   LDLCALC 113* 10/16/2013   ALT 23 10/16/2013   AST 23 10/16/2013   NA 143 10/16/2013   K 4.4 10/16/2013   CL 108 10/16/2013   CREATININE 0.7 10/16/2013   BUN 17 10/16/2013   CO2 30 10/16/2013   TSH 1.03 10/16/2013   HGBA1C 5.4 10/16/2013       Assessment & Plan:

## 2013-10-25 ENCOUNTER — Encounter: Payer: Self-pay | Admitting: Internal Medicine

## 2013-12-06 ENCOUNTER — Ambulatory Visit (AMBULATORY_SURGERY_CENTER): Payer: Self-pay

## 2013-12-06 VITALS — Ht 63.0 in | Wt 123.0 lb

## 2013-12-06 DIAGNOSIS — Z860100 Personal history of colon polyps, unspecified: Secondary | ICD-10-CM

## 2013-12-06 MED ORDER — MOVIPREP 100 G PO SOLR: 1.0000 | Freq: Once | ORAL | Status: DC

## 2013-12-06 NOTE — Progress Notes (Signed)
No allergies to eggs or soy No diet/weight loss meds No home oxygen No past problems with anesthesia except over-sedation with consc sedation  Has email  Emmi instructions given for colonoscopy

## 2013-12-18 ENCOUNTER — Ambulatory Visit (HOSPITAL_COMMUNITY)
Admission: RE | Admit: 2013-12-18 | Discharge: 2013-12-18 | Disposition: A | Discharge status: Home / Self Care | Payer: 59 | Source: Ambulatory Visit | Attending: Internal Medicine | Admitting: Internal Medicine

## 2013-12-18 DIAGNOSIS — Z1231 Encounter for screening mammogram for malignant neoplasm of breast: Secondary | ICD-10-CM | POA: Insufficient documentation

## 2013-12-19 ENCOUNTER — Inpatient Hospital Stay: Admission: RE | Admit: 2013-12-19 | Payer: 59 | Source: Ambulatory Visit

## 2013-12-20 ENCOUNTER — Other Ambulatory Visit: Payer: Self-pay | Admitting: Internal Medicine

## 2013-12-20 ENCOUNTER — Ambulatory Visit
Admission: RE | Admit: 2013-12-20 | Discharge: 2013-12-20 | Disposition: A | Discharge status: Home / Self Care | Payer: 59 | Source: Ambulatory Visit | Attending: Internal Medicine | Admitting: Internal Medicine

## 2013-12-20 ENCOUNTER — Ambulatory Visit (INDEPENDENT_AMBULATORY_CARE_PROVIDER_SITE_OTHER)
Admission: RE | Admit: 2013-12-20 | Discharge: 2013-12-20 | Disposition: A | Discharge status: Home / Self Care | Payer: 59 | Source: Ambulatory Visit | Attending: Internal Medicine | Admitting: Internal Medicine

## 2013-12-20 DIAGNOSIS — M810 Age-related osteoporosis without current pathological fracture: Secondary | ICD-10-CM

## 2013-12-23 ENCOUNTER — Ambulatory Visit (AMBULATORY_SURGERY_CENTER): Payer: 59 | Admitting: Internal Medicine

## 2013-12-23 ENCOUNTER — Encounter: Payer: Self-pay | Admitting: Internal Medicine

## 2013-12-23 VITALS — BP 132/77 | HR 50 | Temp 97.3°F | Resp 26 | Ht 63.0 in | Wt 123.0 lb

## 2013-12-23 DIAGNOSIS — D126 Benign neoplasm of colon, unspecified: Secondary | ICD-10-CM

## 2013-12-23 DIAGNOSIS — Z1211 Encounter for screening for malignant neoplasm of colon: Secondary | ICD-10-CM

## 2013-12-23 DIAGNOSIS — D128 Benign neoplasm of rectum: Secondary | ICD-10-CM

## 2013-12-23 DIAGNOSIS — D129 Benign neoplasm of anus and anal canal: Secondary | ICD-10-CM

## 2013-12-23 DIAGNOSIS — Z86010 Personal history of colonic polyps: Secondary | ICD-10-CM

## 2013-12-23 MED ORDER — SODIUM CHLORIDE 0.9 % IV SOLN: 500.0000 mL | INTRAVENOUS | Status: DC

## 2013-12-23 NOTE — Op Note (Signed)
[place]  [address] [place], [address]   COLONOSCOPY PROCEDURE REPORT  PATIENT: Beverly Shaw, Beverly Shaw  MR#: [id number] BIRTHDATE: 1951-02-23 , 40  yrs. old GENDER: Female ENDOSCOPIST: Eustace Quail, MD REFERRED JS:EGBTD John, M.D. PROCEDURE DATE:  12/23/2013 PROCEDURE:   Colonoscopy with snare polypectomy x1 First Screening Colonoscopy - Avg.  risk and is 50 yrs.  old or older - No.  Prior Negative Screening - Now for repeat screening. N/A  History of Adenoma - Now for follow-up colonoscopy & has been > or = to 3 yrs.  N/A  Polyps Removed Today? Yes. ASA CLASS:   Class II INDICATIONS:average risk screening. Reports index exam 10+ years ago in [place] (results uncertain). MEDICATIONS: MAC sedation, administered by CRNA and propofol (Diprivan) 250mg  IV  DESCRIPTION OF PROCEDURE:   After the risks benefits and alternatives of the procedure were thoroughly explained, informed consent was obtained.  A digital rectal exam revealed no abnormalities of the rectum.   The [id number]  endoscope was introduced through the anus and advanced to the cecum, which was identified by both the appendix and ileocecal valve. No adverse events experienced.   The quality of the prep was excellent, using MoviPrep  The instrument was then slowly withdrawn as the colon was fully examined.   COLON FINDINGS: A single polyp measuring 5 mm in size was found in the transverse colon.  A polypectomy was performed with a cold snare.  The resection was complete and the polyp tissue was completely retrieved.   The colon was otherwise normal.  There was no diverticulosis, inflammation,other polyps or cancers unless previously stated.  Retroflexed views revealed no abnormalities. The time to cecum=3 minutes 39 seconds.  Withdrawal time=10 minutes 32 seconds.  The scope was withdrawn and the procedure completed. COMPLICATIONS: There were no complications.  ENDOSCOPIC IMPRESSION: 1.    Single polyp measuring 5 mm in size was found in the transverse colon; polypectomy was performed with a cold snare 2.   The colon was otherwise normal  RECOMMENDATIONS: 1. Repeat colonoscopy in 5 years if polyp adenomatous; otherwise 10 years   eSigned:  Eustace Quail, MD 12/23/2013 11:14 AM   cc: The Patient and Biagio Borg, MD

## 2013-12-23 NOTE — Progress Notes (Signed)
Called to room to assist during endoscopic procedure.  Patient ID and intended procedure confirmed with present staff. Received instructions for my participation in the procedure from the performing physician.  

## 2013-12-23 NOTE — Progress Notes (Signed)
A/ox3, pleased with MAC, report to RN 

## 2013-12-23 NOTE — Patient Instructions (Signed)
YOU HAD AN ENDOSCOPIC PROCEDURE TODAY AT THE [facility]: Refer to the procedure report that was given to you for any specific questions about what was found during the examination.  If the procedure report does not answer your questions, please call your gastroenterologist to clarify.  If you requested that your care partner not be given the details of your procedure findings, then the procedure report has been included in a sealed envelope for you to review at your convenience later.  YOU SHOULD EXPECT: Some feelings of bloating in the abdomen. Passage of more gas than usual.  Walking can help get rid of the air that was put into your GI tract during the procedure and reduce the bloating. If you had a lower endoscopy (such as a colonoscopy or flexible sigmoidoscopy) you may notice spotting of blood in your stool or on the toilet paper. If you underwent a bowel prep for your procedure, then you may not have a normal bowel movement for a few days.  DIET: Your first meal following the procedure should be a light meal and then it is ok to progress to your normal diet.  A half-sandwich or bowl of soup is an example of a good first meal.  Heavy or fried foods are harder to digest and may make you feel nauseous or bloated.  Likewise meals heavy in dairy and vegetables can cause extra gas to form and this can also increase the bloating.  Drink plenty of fluids but you should avoid alcoholic beverages for 24 hours.  ACTIVITY: Your care partner should take you home directly after the procedure.  You should plan to take it easy, moving slowly for the rest of the day.  You can resume normal activity the day after the procedure however you should NOT DRIVE or use heavy machinery for 24 hours (because of the sedation medicines used during the test).    SYMPTOMS TO REPORT IMMEDIATELY: A gastroenterologist can be reached at any hour.  During normal business hours, 8:30 AM to 5:00 PM Monday through Friday,  call [phone].  After hours and on weekends, please call the GI answering service at [phone] who will take a message and have the physician on call contact you.   Following lower endoscopy (colonoscopy or flexible sigmoidoscopy):  Excessive amounts of blood in the stool  Significant tenderness or worsening of abdominal pains  Swelling of the abdomen that is new, acute  Fever of 100F or higher    FOLLOW UP: If any biopsies were taken you will be contacted by phone or by letter within the next 1-3 weeks.  Call your gastroenterologist if you have not heard about the biopsies in 3 weeks.  Our staff will call the home number listed on your records the next business day following your procedure to check on you and address any questions or concerns that you may have at that time regarding the information given to you following your procedure. This is a courtesy call and so if there is no answer at the home number and we have not heard from you through the emergency physician on call, we will assume that you have returned to your regular daily activities without incident.  SIGNATURES/CONFIDENTIALITY: You and/or your care partner have signed paperwork which will be entered into your electronic medical record.  These signatures attest to the fact that that the information above on your After Visit Summary has been reviewed and is understood.  Full responsibility of the confidentiality   of this discharge information lies with you and/or your care-partner.  Polyp information given.  Dr. Henrene Pastor will advise you about timing for next colonoscopy after pathology report is reviewed.

## 2013-12-24 ENCOUNTER — Telehealth: Payer: Self-pay | Admitting: *Deleted

## 2013-12-24 NOTE — Telephone Encounter (Signed)
  Follow up Call-  Call back number 12/23/2013  Post procedure Call Back phone  # [phone]  Permission to leave phone message Yes     Patient questions:  Do you have a fever, pain , or abdominal swelling? No. Pain Score  0 *  Have you tolerated food without any problems? Yes.    Have you been able to return to your normal activities? Yes.    Do you have any questions about your discharge instructions: Diet   No. Medications  No. Follow up visit  No.  Do you have questions or concerns about your Care? No.  Actions: * If pain score is 4 or above: No action needed, pain <4.

## 2013-12-26 ENCOUNTER — Encounter: Payer: Self-pay | Admitting: Internal Medicine

## 2013-12-29 ENCOUNTER — Other Ambulatory Visit: Payer: Self-pay | Admitting: Internal Medicine

## 2013-12-29 DIAGNOSIS — M810 Age-related osteoporosis without current pathological fracture: Secondary | ICD-10-CM

## 2014-01-08 ENCOUNTER — Encounter: Payer: Self-pay | Admitting: Endocrinology

## 2014-01-08 ENCOUNTER — Ambulatory Visit (INDEPENDENT_AMBULATORY_CARE_PROVIDER_SITE_OTHER): Payer: 59 | Admitting: Endocrinology

## 2014-01-08 VITALS — BP 141/74 | HR 98 | Temp 98.0°F | Resp 14 | Ht 62.0 in | Wt 125.2 lb

## 2014-01-08 DIAGNOSIS — M545 Low back pain, unspecified: Secondary | ICD-10-CM

## 2014-01-08 DIAGNOSIS — M810 Age-related osteoporosis without current pathological fracture: Secondary | ICD-10-CM

## 2014-01-08 NOTE — Progress Notes (Signed)
Patient ID: Beverly Shaw, female   DOB: 05-26-1950, 63 y.o.   MRN: [id number]    Chief complaint: Evaluation of osteoporosis  History of Present Illness:  The patient was noted to have osteoporosis in 2013 on a routine bone density study She was recommended treatment with Prolia and she took this twice without any adverse effect but did not continue because of the cost She was subsequently put on Fosamax which she has been taking for a year without any side effects She thinks she is taking it every Saturday very regularly on an empty stomach as directed Because of persistent osteoporosis on her recent bone density she is now referred here for further management  She thinks she went through menopause at the age of 35 when she had some hot flashes Initially she was given Premarin which she had any trauma and subsequently given an estrogen patch which she has taken for about 10 years. However she is not changing this twice a week and frequently will go up to 2 weeks; using this only for symptomatic relief of hot flashes She does have significant amount of dairy products in her diet She tries to walk regularly, about 2 miles She does not think she has had a height loss although it is about 1 inch lower than in her youth Vitamin D: She was told about 10 years ago.her level was very low and she was given prescription supplement. Currently taking 1000 units of vitamin D 3 along with vitamin D. she has in her calcium and multivitamin supplements    Lumbar spine (L1-L4)  Femoral neck (FN)   T-score  - 1.9  RFN:- 2.2  LFN: - 2.7   BMD (g/cm2)  0.953  RFN: 0.729  LFN: 0.660   Change in BMD from previous DXA test (*)  n/a  The total mean hip BMD increased by 7%      Past Medical History  Diagnosis Date  . Migraines   . Positive TB test     Positive TB skin test  . GERD (gastroesophageal reflux disease)   . Heart murmur   . Episodic low back pain   . Osteopenia 10/15/2010  . Osteoporosis,  unspecified 10/19/2012    Past Surgical History  Procedure Laterality Date  . Abdominal hysterectomy  1996  . Tubal ligation      Family History  Problem Relation Age of Onset  . Heart disease Mother   . Heart disease Father   . Diabetes Other   . Colon cancer Neg Hx   . Pancreatic cancer Neg Hx   . Rectal cancer Neg Hx   . Stomach cancer Neg Hx     Social History:  reports that she has quit smoking. She has never used smokeless tobacco. She reports that she drinks alcohol. She reports that she does not use illicit drugs.  Allergies: No Known Allergies    Medication List       This list is accurate as of: 01/08/14  4:30 PM.  Always use your most recent med list.               alendronate 70 MG tablet  Commonly known as:  FOSAMAX  TAKE 1 TABLET BY MOUTH EVERY 7 DAYS. TAKE WITH A FULL GLASS OF WATER ON AN EMPTY STOMACH.     aspirin 81 MG tablet  Take 81 mg by mouth daily.     Biotin 10 MG Caps  Take 10 mg by mouth.  calcium carbonate 600 MG Tabs tablet  Commonly known as:  OS-CAL  Take 600 mg by mouth daily.     estradiol 0.075 MG/24HR  Commonly known as:  VIVELLE-DOT  Place 1 patch onto the skin 2 (two) times a week.     multivitamin capsule  Take 1 capsule by mouth daily.     SUMAtriptan-naproxen 85-500 MG per tablet  Commonly known as:  TREXIMET  Take 1 tablet by mouth every 2 (two) hours as needed for migraine.        LABS:  No visits with results within 1 Week(s) from this visit. Latest known visit with results is:  Appointment on 10/16/2013  Component Date Value Ref Range Status  . Color, Urine 10/16/2013 YELLOW  Yellow;Lt. Yellow Final  . APPearance 10/16/2013 CLEAR  Clear Final  . Specific Gravity, Urine 10/16/2013 1.025  1.000-1.030 Final  . pH 10/16/2013 6.0  5.0 - 8.0 Final  . Total Protein, Urine 10/16/2013 NEGATIVE  Negative Final  . Urine Glucose 10/16/2013 NEGATIVE  Negative Final  . Ketones, ur 10/16/2013 NEGATIVE  Negative  Final  . Bilirubin Urine 10/16/2013 NEGATIVE  Negative Final  . Hgb urine dipstick 10/16/2013 SMALL* Negative Final  . Urobilinogen, UA 10/16/2013 0.2  0.0 - 1.0 Final  . Leukocytes, UA 10/16/2013 NEGATIVE  Negative Final  . Nitrite 10/16/2013 NEGATIVE  Negative Final  . WBC, UA 10/16/2013 0-2/hpf  0-2/hpf Final  . RBC / HPF 10/16/2013 3-6/hpf* 0-2/hpf Final  . Mucus, UA 10/16/2013 Presence of* None Final  . Squamous Epithelial / LPF 10/16/2013 Rare(0-4/hpf)  Rare(0-4/hpf) Final  . TSH 10/16/2013 1.03  0.35 - 4.50 uIU/mL Final  . WBC 10/16/2013 5.2  4.0 - 10.5 K/uL Final  . RBC 10/16/2013 4.40  3.87 - 5.11 Mil/uL Final  . Hemoglobin 10/16/2013 13.3  12.0 - 15.0 g/dL Final  . HCT 10/16/2013 39.5  36.0 - 46.0 % Final  . MCV 10/16/2013 89.6  78.0 - 100.0 fl Final  . MCHC 10/16/2013 33.6  30.0 - 36.0 g/dL Final  . RDW 10/16/2013 13.4  11.5 - 15.5 % Final  . Platelets 10/16/2013 194.0  150.0 - 400.0 K/uL Final  . Neutrophils Relative % 10/16/2013 56.9  43.0 - 77.0 % Final  . Lymphocytes Relative 10/16/2013 32.1  12.0 - 46.0 % Final  . Monocytes Relative 10/16/2013 9.0  3.0 - 12.0 % Final  . Eosinophils Relative 10/16/2013 1.6  0.0 - 5.0 % Final  . Basophils Relative 10/16/2013 0.4  0.0 - 3.0 % Final  . Neutro Abs 10/16/2013 2.9  1.4 - 7.7 K/uL Final  . Lymphs Abs 10/16/2013 1.7  0.7 - 4.0 K/uL Final  . Monocytes Absolute 10/16/2013 0.5  0.1 - 1.0 K/uL Final  . Eosinophils Absolute 10/16/2013 0.1  0.0 - 0.7 K/uL Final  . Basophils Absolute 10/16/2013 0.0  0.0 - 0.1 K/uL Final  . Total Bilirubin 10/16/2013 1.0  0.2 - 1.2 mg/dL Final  . Bilirubin, Direct 10/16/2013 0.1  0.0 - 0.3 mg/dL Final  . Alkaline Phosphatase 10/16/2013 33* 39 - 117 U/L Final  . AST 10/16/2013 23  0 - 37 U/L Final  . ALT 10/16/2013 23  0 - 35 U/L Final  . Total Protein 10/16/2013 6.1  6.0 - 8.3 g/dL Final  . Albumin 10/16/2013 3.7  3.5 - 5.2 g/dL Final  . Sodium 10/16/2013 143  135 - 145 mEq/L Final  . Potassium  10/16/2013 4.4  3.5 - 5.1 mEq/L Final  . Chloride 10/16/2013  108  96 - 112 mEq/L Final  . CO2 10/16/2013 30  19 - 32 mEq/L Final  . Glucose, Bld 10/16/2013 76  70 - 99 mg/dL Final  . BUN 10/16/2013 17  6 - 23 mg/dL Final  . Creatinine, Ser 10/16/2013 0.7  0.4 - 1.2 mg/dL Final  . Calcium 10/16/2013 8.9  8.4 - 10.5 mg/dL Final  . GFR 10/16/2013 87.43  >60.00 mL/min Final  . Cholesterol 10/16/2013 192  0 - 200 mg/dL Final   ATP III Classification       Desirable:  < 200 mg/dL               Borderline High:  200 - 239 mg/dL          High:  > = 240 mg/dL  . Triglycerides 10/16/2013 149.0  0.0 - 149.0 mg/dL Final   Normal:  <150 mg/dLBorderline High:  150 - 199 mg/dL  . HDL 10/16/2013 49.00  >39.00 mg/dL Final  . VLDL 10/16/2013 29.8  0.0 - 40.0 mg/dL Final  . LDL Cholesterol 10/16/2013 113* 0 - 99 mg/dL Final  . Total CHOL/HDL Ratio 10/16/2013 4   Final                  Men          Women1/2 Average Risk     3.4          3.3Average Risk          5.0          4.42X Average Risk          9.6          7.13X Average Risk          15.0          11.0                      . Hemoglobin A1C 10/16/2013 5.4  4.6 - 6.5 % Final   Glycemic Control Guidelines for People with Diabetes:Non Diabetic:  <6%Goal of Therapy: <7%Additional Action Suggested:  >8%      REVIEW OF SYSTEMS:        No recent weight loss  Wt Readings from Last 3 Encounters:  01/08/14 125 lb 3.2 oz (56.79 kg)  12/23/13 123 lb (55.792 kg)  12/06/13 123 lb (55.792 kg)       Thyroid:  No cold or heat intolerance, unusual fatigue. Thyroid levels have been normal     His blood pressure has been normal.     No swelling of feet.     No shortness of breath       Bowel habits:  No change.      No joint  Pains. She has recurrent low back pain, mostly with activities like bending and gardening      PHYSICAL EXAM:  BP 141/74  Pulse 98  Temp(Src) 98 F (36.7 C)  Resp 14  Ht 5\' 3"  (1.6 m)  Wt 125 lb 3.2 oz (56.79 kg)  BMI  22.18 kg/m2  SpO2 98%  GENERAL: Averagely built and nourished  No pallor, clubbing, lymphadenopathy or edema.  Skin:  no rash or pigmentation.  EYES:  Externally normal.   ENT: Oral mucosa and tongue normal.  THYROID:  Not palpable.  HEART:  Normal  S1 and S2; no murmur or click.  CHEST:  Normal shape.  Lungs: Vescicular breath sounds heard equally.  No crepitations/ wheeze.  ABDOMEN:  No  distention.  Liver and spleen not palpable.  No other mass or tenderness.  NEUROLOGICAL: .Reflexes are bilaterally at biceps.  JOINTS:  Mild osteoarthritic changes of the finger joints.  Lumbar spine shows loss of lordosis and prominent L4 and L5 spine without tenderness   ASSESSMENT:   Postmenopausal osteoporosis. She does not have any obvious risk factors for developing osteoporosis except being small frame She reports a history of vitamin D deficiency in the past and not clear this has been adequately supplemented She has had some improvement in her femoral neck bone density with treatment for 2 years with Prolia or Fosamax  Currently appears to be asymptomatic; however not clear if she has any asymptomatic lumbar vertebral compression since she has lost about 1 inch in height and also has prominent spinous processes   PLAN:   Check vitamin D level Continue Fosamax for now Consider using the Reclast infusion especially if she has evidence of any spinal compression fractures on lumbar x-ray  [facility] 01/08/2014, 4:30 PM    Addendum: Labs show normal vitamin D and no compression fractures. Recommend continuing Fosamax for another 4-5 years, bone density in 2 years

## 2014-01-09 ENCOUNTER — Telehealth: Payer: Self-pay | Admitting: Endocrinology

## 2014-01-09 ENCOUNTER — Ambulatory Visit (HOSPITAL_COMMUNITY)
Admission: RE | Admit: 2014-01-09 | Discharge: 2014-01-09 | Disposition: A | Discharge status: Home / Self Care | Payer: 59 | Source: Ambulatory Visit | Attending: Endocrinology | Admitting: Endocrinology

## 2014-01-09 DIAGNOSIS — M5137 Other intervertebral disc degeneration, lumbosacral region: Secondary | ICD-10-CM | POA: Insufficient documentation

## 2014-01-09 DIAGNOSIS — M4120 Other idiopathic scoliosis, site unspecified: Secondary | ICD-10-CM | POA: Diagnosis not present

## 2014-01-09 DIAGNOSIS — M545 Low back pain, unspecified: Secondary | ICD-10-CM | POA: Diagnosis present

## 2014-01-09 DIAGNOSIS — M4040 Postural lordosis, site unspecified: Secondary | ICD-10-CM | POA: Insufficient documentation

## 2014-01-09 DIAGNOSIS — M51379 Other intervertebral disc degeneration, lumbosacral region without mention of lumbar back pain or lower extremity pain: Secondary | ICD-10-CM | POA: Insufficient documentation

## 2014-01-09 LAB — VITAMIN D 25 HYDROXY (VIT D DEFICIENCY, FRACTURES): VITD: 45.38 ng/mL (ref 30.00–100.00)

## 2014-01-09 NOTE — Telephone Encounter (Signed)
Patient stated that she went to get her xray yesterday and it was closed, she would like to know if she could get it where she [facility]. She also stated that Dr Dwyane Dee suggested that she get a reclaspe injection and insurance company said that she needed a cpt code.

## 2014-01-09 NOTE — Telephone Encounter (Signed)
Please see below and advise.

## 2014-01-09 NOTE — Telephone Encounter (Signed)
Left message on patients vm

## 2014-01-09 NOTE — Progress Notes (Signed)
Quick Note:  Please let patient know that the lab result is normal and no additional supplement needed ______

## 2014-01-09 NOTE — Telephone Encounter (Signed)
She can get the x-ray at [facility] long, diagnosis or injection is postmenopausal osteoporosis, 733.0

## 2014-01-21 ENCOUNTER — Encounter: Payer: Self-pay | Admitting: *Deleted

## 2014-07-08 ENCOUNTER — Encounter: Payer: Self-pay | Admitting: Internal Medicine

## 2014-07-08 ENCOUNTER — Ambulatory Visit (INDEPENDENT_AMBULATORY_CARE_PROVIDER_SITE_OTHER): Payer: 59 | Admitting: Internal Medicine

## 2014-07-08 VITALS — BP 130/90 | HR 64 | Temp 98.0°F | Wt 125.0 lb

## 2014-07-08 DIAGNOSIS — M79674 Pain in right toe(s): Secondary | ICD-10-CM

## 2014-07-08 DIAGNOSIS — M21611 Bunion of right foot: Secondary | ICD-10-CM | POA: Insufficient documentation

## 2014-07-08 DIAGNOSIS — Z0000 Encounter for general adult medical examination without abnormal findings: Secondary | ICD-10-CM

## 2014-07-08 DIAGNOSIS — M2011 Hallux valgus (acquired), right foot: Secondary | ICD-10-CM

## 2014-07-08 DIAGNOSIS — Z0189 Encounter for other specified special examinations: Secondary | ICD-10-CM

## 2014-07-08 NOTE — Progress Notes (Signed)
Pre visit review using our clinic review tool, if applicable. No additional management support is needed unless otherwise documented below in the visit note. 

## 2014-07-08 NOTE — Patient Instructions (Addendum)
Please continue all other medications as before, and refills have been done if requested.  Please have the pharmacy call with any other refills you may need.  Please keep your appointments with your specialists as you may have planned  You will be contacted regarding the referral for: podiatry - Dr Valentina Lucks  Please return in 6 months, or sooner if needed, with Lab testing done 3-5 days before

## 2014-07-08 NOTE — Progress Notes (Signed)
   Subjective:    Patient ID: Beverly Shaw, female    DOB: Jul 22, 1950, 64 y.o.   MRN: [id number]  HPI  Here to f/u, has had bilat buinon pain mild for years, now with worsening lateral deviation of the right great toe in the past 6 months, so that now she c/o rubbing/friction between all the other 4 toes, despite wider shoes.  No fever, rash, but pain mod to severe, worse to walk, better to sit Past Medical History  Diagnosis Date  . Migraines   . Positive TB test     Positive TB skin test  . GERD (gastroesophageal reflux disease)   . Heart murmur   . Episodic low back pain   . Osteopenia 10/15/2010  . Osteoporosis, unspecified 10/19/2012   Past Surgical History  Procedure Laterality Date  . Abdominal hysterectomy  1996  . Tubal ligation      reports that she has quit smoking. She has never used smokeless tobacco. She reports that she drinks alcohol. She reports that she does not use illicit drugs. family history includes Diabetes in her other; Heart disease in her father and mother. There is no history of Colon cancer, Pancreatic cancer, Rectal cancer, Stomach cancer, or Osteoporosis. No Known Allergies Current Outpatient Prescriptions on File Prior to Visit  Medication Sig Dispense Refill  . alendronate (FOSAMAX) 70 MG tablet TAKE 1 TABLET BY MOUTH EVERY 7 DAYS. TAKE WITH A FULL GLASS OF WATER ON AN EMPTY STOMACH. 12 tablet 3  . aspirin 81 MG tablet Take 81 mg by mouth daily.    . Biotin 10 MG CAPS Take 10 mg by mouth.    . calcium carbonate (OS-CAL) 600 MG TABS Take 600 mg by mouth daily.      Marland Kitchen estradiol (VIVELLE-DOT) 0.075 MG/24HR Place 1 patch onto the skin 2 (two) times a week.    . Multiple Vitamin (MULTIVITAMIN) capsule Take 1 capsule by mouth daily.       No current facility-administered medications on file prior to visit.   Review of Systems All otherwise neg per pt     Objective:   Physical Exam BP 130/90 mmHg  Pulse 64  Temp(Src) 98 F (36.7 C) (Oral)  Wt 125  lb (56.7 kg) VS noted,  Constitutional: Pt appears well-developed, well-nourished.  HENT: Head: NCAT.  Right Ear: External ear normal.  Left Ear: External ear normal.  Eyes: . Pupils are equal, round, and reactive to light. Conjunctivae and EOM are normal Neck: Normal range of motion. Neck supple.  Cardiovascular: Normal rate and regular rhythm.   Pulmonary/Chest: Effort normal and breath sounds without rales or wheezing.  Abd:  Soft, NT, ND, + BS Neurological: Pt is alert. Not confused , motor grossly intact Skin: Skin is warm. Has irritative erythema/early blistering between 4 toes with large right great toe MTP bunion with mod to severe lateral great toe deviation Psychiatric: Pt behavior is normal. No agitation.     Assessment & Plan:

## 2014-07-08 NOTE — Assessment & Plan Note (Signed)
For podiatry referral, as this is primarily surgical issue,  to f/u any worsening symptoms or concerns

## 2014-07-08 NOTE — Assessment & Plan Note (Signed)
No evidence for cellulitis, but with several early blistery rubbing type lesions; for neosporin and bandaid protection for now to assist with less friction, cont current shoes, but to see podiatry as above as well

## 2014-07-23 ENCOUNTER — Encounter: Payer: Self-pay | Admitting: Podiatrist

## 2014-07-23 ENCOUNTER — Ambulatory Visit (INDEPENDENT_AMBULATORY_CARE_PROVIDER_SITE_OTHER): Payer: 59 | Admitting: Podiatrist

## 2014-07-23 ENCOUNTER — Ambulatory Visit: Payer: Self-pay

## 2014-07-23 VITALS — BP 134/90 | HR 62 | Resp 12

## 2014-07-23 DIAGNOSIS — B353 Tinea pedis: Secondary | ICD-10-CM

## 2014-07-23 DIAGNOSIS — R52 Pain, unspecified: Secondary | ICD-10-CM

## 2014-07-23 DIAGNOSIS — M2011 Hallux valgus (acquired), right foot: Secondary | ICD-10-CM | POA: Diagnosis not present

## 2014-07-23 DIAGNOSIS — M21611 Bunion of right foot: Secondary | ICD-10-CM

## 2014-07-23 MED ORDER — NAFTIFINE HCL 2 % EX GEL: 1.0000 "application " | Freq: Every day | CUTANEOUS | Status: DC

## 2014-07-23 NOTE — Progress Notes (Signed)
   Subjective:    Patient ID: Beverly Shaw, female    DOB: November 08, 1950, 64 y.o.   MRN: [id number]  HPI  PT STATED B/L BUNION ARE BEEN PAINFUL FOR 1 MONTH AND BLISTERS ON THE TOES FOR LONG TERM.  FEET ARE GETTING WORSE AND GET AGGRAVATED BY WEARING CERTAIN SHOES. TRIED NO TREATMENT.  Review of Systems  All other systems reviewed and are negative.      Objective:   Physical Exam Patient is awake, alert, and oriented x 3.  In no acute distress.  Vascular status is intact with palpable pedal pulses at 2/4 DP and PT bilateral and capillary refill time within normal limits. Neurological sensation is also intact bilaterally via Semmes Weinstein monofilament at 5/5 sites. Light touch, vibratory sensation, Achilles tendon reflex is intact. Dermatological exam reveals some macerated peeling skin between the toes of the right foot. No sign of infection noted. No redness or swelling seen.  otherwise skin color, turger and texture as normal. No open lesions present.  Musculature intact with dorsiflexion, plantarflexion, inversion, eversion. Moderate bunion deformities present right greater than left. Lateral deviation of the hallux and lesser toes is noted right over left causing crowding of the digits. These findings seen on xray and clinically.     Assessment & Plan:  Interdigital tinea pedis right foot, bunion deformity right over left  Plan:  Recommended naftin gel for the tinea pedis and a rx was written. Discussed that she would ultimately need surgery to fix the bunion deformity.  I will see if her insurance requires [facility] to [facility].  Once confirmed, she will follow up for a pre operative consult with Dr. Jacqualyn Posey.

## 2014-07-23 NOTE — Patient Instructions (Signed)
We will call you with a an estimate of the surgical cost to have your bunion fixed.  We will also find out where the most economical place is to have the surgery performed.  I will have someone from billing call to discuss and then set up a consult for surgery when you would like.   Bunion (Hallux Valgus) A bony bump (protrusion) on the inside of the foot, at the base of the first toe, is called a bunion (hallux valgus). A bunion causes the first toe to angle toward the other toes. SYMPTOMS   A bony bump on the inside of the foot, causing an outward turning of the first toe. It may also overlap the second toe.  Thickening of the skin (callus) over the bony bump.  Fluid buildup under the callus. Fluid may become red, tender, and swollen (inflamed) with constant irritation or pressure.  Foot pain and stiffness. CAUSES  Many causes exist, including:  Inherited from your family (genetics).  Injury (trauma) forcing the first toe into a position in which it overlaps other toes.  Bunions are also associated with wearing shoes that have a narrow toe box (pointy shoes). RISK INCREASES WITH:  Family history of foot abnormalities, especially bunions.  Arthritis.  Narrow shoes, especially high heels. PREVENTION  Wear shoes with a wide toe box.  Avoid shoes with high heels.  Wear a small pad between the big toe and second toe.  Maintain proper conditioning:  Foot and ankle flexibility.  Muscle strength and endurance. PROGNOSIS  With proper treatment, bunions can typically be cured. Occasionally, surgery is required.  RELATED COMPLICATIONS   Infection of the bunion.  Arthritis of the first toe.  Risks of surgery, including infection, bleeding, injury to nerves (numb toe), recurrent bunion, overcorrection (toe points inward), arthritis of the big toe, big toe pointing upward, and bone not healing. TREATMENT  Treatment first consists of stopping the activities that aggravate the  pain, taking pain medicines, and icing to reduce inflammation and pain. Wear shoes with a wide toe box. Shoes can be modified by a shoe repair person to relieve pressure on the bunion, especially if you cannot find shoes with a wide enough toe box. You may also place a pad with the center cut out in your shoe, to reduce pressure on the bunion. Sometimes, an arch support (orthotic) may reduce pressure on the bunion and alleviate the symptoms. Stretching and strengthening exercises for the muscles of the foot may be useful. You may choose to wear a brace or pad at night to hold the big toe away from the second toe. If non-surgical treatments are not successful, surgery may be needed. Surgery involves removing the overgrown tissue and correcting the position of the first toe, by realigning the bones. Bunion surgery is typically performed on an outpatient basis, meaning you can go home the same day as surgery. The surgery may involve cutting the mid portion of the bone of the first toe, or just cutting and repairing (reconstructing) the ligaments and soft tissues around the first toe.  MEDICATION   If pain medicine is needed, nonsteroidal anti-inflammatory medicines, such as aspirin and ibuprofen, or other minor pain relievers, such as acetaminophen, are often recommended.  Do not take pain medicine for 7 days before surgery.  Prescription pain relievers are usually only prescribed after surgery. Use only as directed and only as much as you need.  Ointments applied to the skin may be helpful. HEAT AND COLD  Cold treatment (  icing) relieves pain and reduces inflammation. Cold treatment should be applied for 10 to 15 minutes every 2 to 3 hours for inflammation and pain and immediately after any activity that aggravates your symptoms. Use ice packs or an ice massage.  Heat treatment may be used prior to performing the stretching and strengthening activities prescribed by your caregiver, physical therapist, or  athletic trainer. Use a heat pack or a warm soak. SEEK MEDICAL CARE IF:   Symptoms get worse or do not improve in 2 weeks, despite treatment.  After surgery, you develop fever, increasing pain, redness, swelling, drainage of fluids, bleeding, or increasing warmth around the surgical area.  New, unexplained symptoms develop. (Drugs used in treatment may produce side effects.) Document Released: 05/09/2005 Document Revised: 08/01/2011 Document Reviewed: 08/21/2008 [facility] Patient Information 2015 [place], [place]. This information is not intended to replace advice given to you by your health care provider. Make sure you discuss any questions you have with your health care provider.

## 2014-08-08 ENCOUNTER — Ambulatory Visit (INDEPENDENT_AMBULATORY_CARE_PROVIDER_SITE_OTHER): Payer: 59 | Admitting: Podiatry

## 2014-08-08 ENCOUNTER — Encounter: Payer: Self-pay | Admitting: Podiatry

## 2014-08-08 DIAGNOSIS — M2011 Hallux valgus (acquired), right foot: Secondary | ICD-10-CM | POA: Diagnosis not present

## 2014-08-08 DIAGNOSIS — M21611 Bunion of right foot: Secondary | ICD-10-CM

## 2014-08-08 NOTE — Patient Instructions (Signed)
Pre-Operative Instructions  Congratulations, you have decided to take an important step to improving your quality of life.  You can be assured that the doctors of [facility] will be with you every step of the way.  1. Plan to be at the surgery center/hospital at least 1 (one) hour prior to your scheduled time unless otherwise directed by the surgical center/hospital staff.  You must have a responsible adult accompany you, remain during the surgery and drive you home.  Make sure you have directions to the surgical center/hospital and know how to get there on time. 2. For hospital based surgery you will need to obtain a history and physical form from your family physician within 1 month prior to the date of surgery- we will give you a form for you primary physician.  3. We make every effort to accommodate the date you request for surgery.  There are however, times where surgery dates or times have to be moved.  We will contact you as soon as possible if a change in schedule is required.   4. No Aspirin/Ibuprofen for one week before surgery.  If you are on aspirin, any non-steroidal anti-inflammatory medications (Mobic, Aleve, Ibuprofen) you should stop taking it 7 days prior to your surgery.  You make take Tylenol  For pain prior to surgery.  5. Medications- If you are taking daily heart and blood pressure medications, seizure, reflux, allergy, asthma, anxiety, pain or diabetes medications, make sure the surgery center/hospital is aware before the day of surgery so they may notify you which medications to take or avoid the day of surgery. 6. No food or drink after midnight the night before surgery unless directed otherwise by surgical center/hospital staff. 7. No alcoholic beverages 24 hours prior to surgery.  No smoking 24 hours prior to or 24 hours after surgery. 8. Wear loose pants or shorts- loose enough to fit over bandages, boots, and casts. 9. No slip on shoes, sneakers are best. 10. Bring  your boot with you to the surgery center/hospital.  Also bring crutches or a walker if your physician has prescribed it for you.  If you do not have this equipment, it will be provided for you after surgery. 11. If you have not been contracted by the surgery center/hospital by the day before your surgery, call to confirm the date and time of your surgery. 12. Leave-time from work may vary depending on the type of surgery you have.  Appropriate arrangements should be made prior to surgery with your employer. 13. Prescriptions will be provided immediately following surgery by your doctor.  Have these filled as soon as possible after surgery and take the medication as directed. 14. Remove nail polish on the operative foot. 15. Wash the night before surgery.  The night before surgery wash the foot and leg well with the antibacterial soap provided and water paying special attention to beneath the toenails and in between the toes.  Rinse thoroughly with water and dry well with a towel.  Perform this wash unless told not to do so by your physician.  Enclosed: 1 Ice pack (please put in freezer the night before surgery)   1 Hibiclens skin cleaner   Pre-op Instructions  If you have any questions regarding the instructions, do not hesitate to call our office.  [place]: [address] [place], [place] [address] [phone]  [place]: [address] [place], [place] [address] [phone]  [place]: [address]  [place], [place] [address] [phone]  Dr. Richard   Tuchman DPM, Dr. Norman Regal DPM Dr. Richard Sikora DPM, Dr. M. Todd Hyatt DPM, Dr. Kathryn Egerton DPM, Dr. Matthew Wagoner DPM 

## 2014-08-12 NOTE — Progress Notes (Signed)
Patient ID: Beverly Shaw, female   DOB: 04-07-1951, 64 y.o.   MRN: [id number]  Subjective: 64 year old female presents the office today with complaints of painful bunions to bilateral feet with a right greater than left. She states that she has had bunions present for greater than one year however over the last several months she has started his increasing pain over onset of the bunion which is also developed blisters between the hallux and second toe due to irritation. She is attended shoe gear modifications, padding, offloading without any resolution of symptoms. This time she is requesting surgical intervention to help decrease her pain and deformity. No other complaints at this time.  Objective: AAO 3, NAD DP/PT pulses palpable, CRT less than 3 seconds Protective sensation intact with Simms Weinstein monofilament, vibratory sensation intact, Achilles tendon reflex intact. Moderate structural HAV deformity is present bilaterally with prominence of the first metatarsal head medially and lateral deviation of the hallux. There is mild irritation over onset of the bunion prominence and irritation in shoe gear. There is mild discomfort upon palpation of the medial eminence of the 1st metatarsal head on the right > left. There is no pain or crepitation with first MTPJ range of motion. There is no snapping hypermobility. Decrease in medial arch height is present upon weightbearing. There does appear to be some irritation along the medial aspect of the second digit on the right foot from pressure. There is no definitive open lesion at this time. No other areas of tenderness bilateral lower extremities. MMT 5/5, ROM WNL No pain with calf compression, swelling, warmth, erythema.  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  Plan: -Previous x-rays were discussed and reviewed with the patient. -Both conservative and surgical options were discussed with patient  including alternatives, risks, complications. At this time the patient is requesting surgical intervention to help decrease her pain and deformity. I discussed with her an Altamese [place] with screw/wire placement. I discussed the incision placement as well as the postoperative course. Risks of the surgery were discussed the patient which included, but not limited to, infection, bleeding, pain, swelling, need for further surgery, painful or ugly scar, delayed or nonhealing, numbness or sensation changes, over/under correction, hardware failure, reoccurrence, transfer lesions, DVT/PE, loss of foot. Patient understands these risks and wishes to proceed with surgery. All 3 pages of the surgical consent were discussed with the patient and signed. No promises or guarantees were given as the outcome of the procedure and all questions were answered to the best of my ability. Surgery will be performed of the [facility] specialty surgical center. She'll follow-up in the office after surgery or sooner if any problems are to arise. I encouraged her to call the office with any questions, concerns, changes symptoms.

## 2014-09-08 DIAGNOSIS — M2011 Hallux valgus (acquired), right foot: Secondary | ICD-10-CM | POA: Diagnosis not present

## 2014-09-09 ENCOUNTER — Telehealth: Payer: Self-pay

## 2014-09-09 NOTE — Telephone Encounter (Signed)
Left message for pt to call with questions or concerns regarding post operative status

## 2014-09-12 ENCOUNTER — Ambulatory Visit (INDEPENDENT_AMBULATORY_CARE_PROVIDER_SITE_OTHER): Payer: 59 | Admitting: Podiatry

## 2014-09-12 ENCOUNTER — Encounter: Payer: Self-pay | Admitting: Podiatry

## 2014-09-12 ENCOUNTER — Ambulatory Visit (INDEPENDENT_AMBULATORY_CARE_PROVIDER_SITE_OTHER): Payer: 59

## 2014-09-12 VITALS — BP 157/91 | HR 61 | Resp 18

## 2014-09-12 DIAGNOSIS — M2011 Hallux valgus (acquired), right foot: Secondary | ICD-10-CM | POA: Diagnosis not present

## 2014-09-12 DIAGNOSIS — M21611 Bunion of right foot: Secondary | ICD-10-CM

## 2014-09-12 DIAGNOSIS — Z9889 Other specified postprocedural states: Secondary | ICD-10-CM

## 2014-09-12 NOTE — Patient Instructions (Signed)
Continue with CAM walker

## 2014-09-13 NOTE — Progress Notes (Signed)
Patient ID: Beverly Shaw, female   DOB: 11/30/1950, 64 y.o.   MRN: [id number]  DOS: 09/08/14 Right Altamese [place]  Subjective: 64 year old female presents the office today 4 days status post right foot Austin bunionectomy. She states that she has continued with the Cam Walker and crutches at all times. She states that overall she is doing well and she does not have much pain. She's not required pain medication. She states that she had some nausea immediately after surgery on the first day for which she took Phenergan for however that resolved. She is continue the antibiotics. She denies any systemic complaints as fevers, chills, nausea, vomiting. Denies any calf pain, chest pain, shortness of breath. No other complaints at this time in no acute changes since last appointment.   Objective: AAO 3, NAD DP/PT pulses palpable, CRT less than 3 seconds Protective sensation intact with Simplex monofilament incision on the dorsal medial aspect of the right foot as well coapted without any evidence of dehiscence and sutures are intact. There is mild overlying edema to the area without any associated erythema, increase in warmth. There is no clinical signs of infection and this time. There is no significant tenderness along the surgical site. There is slight tenderness with range of motion of the first MTPJ. No other open lesions or pre-ulcerative lesions are identified bilaterally. No pain with calf compression, swelling, warmth, erythema.  Assessment: 64 year old female 4 days' status post right foot Austin bunionectomy, doing well  Plan: -X-rays were obtained and reviewed the patient -Treatment options discussed include alternatives, risks, complications -Antibiotic ointment was applied over the incision followed by dry sterile dressing. Keep the dressing clean, dry, intact. -Continue with CAM walker. Can be weightbearing as tolerated. -Pain medication as needed -Finish course of antibiotics -Monitor  for any signs or symptoms of infection or other, occasions. Directed to call the office immediately should any occur or go to the ER. -Follow-up in 10 days for suture removal or sooner if any problems are to arise. Call if questions or concerns in the meantime.

## 2014-09-19 ENCOUNTER — Other Ambulatory Visit: Payer: Self-pay | Admitting: Internal Medicine

## 2014-09-22 ENCOUNTER — Ambulatory Visit (INDEPENDENT_AMBULATORY_CARE_PROVIDER_SITE_OTHER): Payer: 59 | Admitting: Podiatry

## 2014-09-22 ENCOUNTER — Encounter: Payer: Self-pay | Admitting: Podiatry

## 2014-09-22 VITALS — BP 144/80 | HR 66 | Resp 18

## 2014-09-22 DIAGNOSIS — M2011 Hallux valgus (acquired), right foot: Secondary | ICD-10-CM

## 2014-09-22 DIAGNOSIS — Z9889 Other specified postprocedural states: Secondary | ICD-10-CM

## 2014-09-22 DIAGNOSIS — M21611 Bunion of right foot: Secondary | ICD-10-CM

## 2014-09-23 NOTE — Progress Notes (Signed)
Patient ID: MARINDA TYER, female   DOB: [birth year], 65 y.o.   MRN: [id number]  DOS: 09/08/14 Right Altamese [place]  Subjective: 64 year old female presents the office today 2 weeks status post right foot Austin bunionectomy. She states that she has continued with the Cam Walker. She states that overall she is doing well and she does not have much pain and has not required any pain medication. She finished her course of antibiotics. She denies any systemic complaints as fevers, chills, nausea, vomiting. Denies any calf pain, chest pain, shortness of breath. No other complaints at this time in no acute changes since last appointment.   Objective: AAO 3, NAD; presents in CAM walker  DP/PT pulses palpable, CRT less than 3 seconds Protective sensation intact with Simms Weinstein monofilament. Incision on the dorsal medial aspect of the right foot as well coapted without any evidence of dehiscence and sutures are intact. There is mild overlying edema to the area without any associated erythema, increase in warmth. There is no clinical signs of infection and this time. There is no significant tenderness along the surgical site. There is decreased tenderness with range of motion of the first MTPJ. No other open lesions or pre-ulcerative lesions are identified bilaterally. No pain with calf compression, swelling, warmth, erythema.  Assessment: 64 year old female 2 weeks status post right foot Austin bunionectomy, doing well  Plan: -Treatment options discussed include alternatives, risks, complications -Sutures were removed without complications. Antibiotic ointment was applied over the incision followed by dry sterile dressing.  She trimmed the dressing in 24 hours and started to shower. Discussed there is a hold off on showering if there is any problems with the incision to call the office immediately. -Continue with CAM walker. Can be weightbearing as tolerated. -Pain medication as needed -Monitor for  any signs or symptoms of infection or other, occasions. Directed to call the office immediately should any occur or go to the ER. -Follow-up in  2 weeks or sooner if any problems are to arise. Call if questions or concerns in the meantime. -X-ray next appointment.

## 2014-10-06 ENCOUNTER — Encounter: Payer: Self-pay | Admitting: Podiatry

## 2014-10-06 ENCOUNTER — Ambulatory Visit (INDEPENDENT_AMBULATORY_CARE_PROVIDER_SITE_OTHER): Payer: 59 | Admitting: Podiatry

## 2014-10-06 ENCOUNTER — Ambulatory Visit (INDEPENDENT_AMBULATORY_CARE_PROVIDER_SITE_OTHER): Payer: 59

## 2014-10-06 DIAGNOSIS — Z9889 Other specified postprocedural states: Secondary | ICD-10-CM | POA: Diagnosis not present

## 2014-10-06 DIAGNOSIS — M2011 Hallux valgus (acquired), right foot: Secondary | ICD-10-CM | POA: Diagnosis not present

## 2014-10-06 DIAGNOSIS — M21611 Bunion of right foot: Secondary | ICD-10-CM

## 2014-10-07 NOTE — Progress Notes (Signed)
Patient ID: Beverly Shaw, female   DOB: [birth year], 64 y.o.   MRN: [id number]  DOS: 09/08/14 Right Altamese [place]  Subjective: 64 year old female presents the office today 4 weeks status post right foot Austin bunionectomy. She states that she has continued with the Cam Walker and wearing the bunion splint. She states that overall she is doing well and she does not have much pain and has not required any pain medication. She does state the area swells at times. Denies any redness over the surgical site.  She denies any systemic complaints as fevers, chills, nausea, vomiting. Denies any calf pain, chest pain, shortness of breath. No other complaints at this time in no acute changes since last appointment.   Objective: AAO 3, NAD; presents in CAM walker  DP/PT pulses palpable, CRT less than 3 seconds Protective sensation intact with Simms Weinstein monofilament. Incision on the dorsal medial aspect of the right foot as well coapted without any evidence of dehiscence and a scar is formed. There is mild overlying edema to the area without any associated erythema, increase in warmth. There are no clinical signs of infection and this time. There is no significant tenderness along the surgical site. There is decreased tenderness with range of motion of the first MTPJ with mild discomfort in plantarflexion of the joint. No other open lesions or pre-ulcerative lesions are identified bilaterally. No pain with calf compression, swelling, warmth, erythema.  Assessment: 64 year old female 4 weeks status post right foot Austin bunionectomy, doing well  Plan: -X-rays were obtained and reviewed with the patient.  -Treatment options discussed include alternatives, risks, complications -Transition to surgical shoe which was dispensed. Can be weightbearing as tolerated. -Continue bunion splint. I discussed with her ROM exercises of the 1st MTPJ which she can start to do to help improve the ROM.  -Dispensed  compression anklet.  -Pain medication as needed -Discussed she can use cocoa butter or  vitamin E to massage of the scar to help prevent scar tissue. -Monitor for any signs or symptoms of infection or other, occasions. Directed to call the office immediately should any occur or go to the ER. -Follow-up in  2 weeks or sooner if any problems are to arise. Call if questions or concerns in the meantime. At that time will likely transition into a regular sneaker. -X-ray next appointment.

## 2014-10-17 ENCOUNTER — Ambulatory Visit (INDEPENDENT_AMBULATORY_CARE_PROVIDER_SITE_OTHER): Payer: 59 | Admitting: Podiatry

## 2014-10-17 ENCOUNTER — Ambulatory Visit (INDEPENDENT_AMBULATORY_CARE_PROVIDER_SITE_OTHER): Payer: 59

## 2014-10-17 ENCOUNTER — Encounter: Payer: Self-pay | Admitting: Podiatry

## 2014-10-17 VITALS — BP 160/92 | HR 69 | Resp 12

## 2014-10-17 DIAGNOSIS — Z9889 Other specified postprocedural states: Secondary | ICD-10-CM

## 2014-10-17 DIAGNOSIS — M2011 Hallux valgus (acquired), right foot: Secondary | ICD-10-CM

## 2014-10-21 NOTE — Progress Notes (Signed)
Patient ID: Beverly Shaw, female   DOB: March 19, 1951, 64 y.o.   MRN: [id number]  DOS: 09/08/14 Right Liane Comber bunionectomy  Subjective: Ms. Beverly Shaw presents the office today 6 weeks status post right foot Austin bunionectomy. She has remained in the surgical shoe without any complications. She says overall she is doing well and she does not have any pain is still not requiring any pain medication. She does get some intermittent swelling however it does appear to be improving. She denies any redness overlying the surgical site. She denies any systemic complaints as fevers, chills, nausea, vomiting. Denies any calf pain, chest pain, shortness of breath. No other complaints at this time in no acute changes since last appointment.   Objective: AAO 3, NAD; presents in CAM walker  DP/PT pulses palpable, CRT less than 3 seconds Protective sensation intact with Simms Weinstein monofilament. Incision on the dorsal medial aspect of the right foot as well coapted without any evidence of dehiscence and a scar is formed. There is mild edema to the area without any associated erythema, increase in warmth. There are no clinical signs of infection and this time. There is no tenderness along the surgical site. There is mild restriction of motion of the MTPJ however no pain with ROM. No other open lesions or pre-ulcerative lesions are identified bilaterally. No pain with calf compression, swelling, warmth, erythema.  Assessment: 64 year old female 6  weeks status post right foot Austin bunionectomy, doing well  Plan: -X-rays were obtained and reviewed with the patient.  -Treatment options discussed include alternatives, risks, complications -At this time she can transition back into regular she was tolerated. -Continue with ROM exercises of the 1st MTPJ  -Dispensed compression anklet.  -Monitor for any signs or symptoms of infection or other, occasions. Directed to call the office immediately should any occur or go to  the ER. -Follow-up in 4 weeks or sooner if any problems are to arise. Call if questions or concerns in the meantime. At that time will likely transition into a regular sneaker. -X-ray next appointment.

## 2014-10-24 ENCOUNTER — Encounter: Payer: 59 | Admitting: Internal Medicine

## 2014-11-07 ENCOUNTER — Other Ambulatory Visit (INDEPENDENT_AMBULATORY_CARE_PROVIDER_SITE_OTHER): Payer: 59

## 2014-11-07 DIAGNOSIS — Z0189 Encounter for other specified special examinations: Secondary | ICD-10-CM

## 2014-11-07 DIAGNOSIS — Z0000 Encounter for general adult medical examination without abnormal findings: Secondary | ICD-10-CM

## 2014-11-07 LAB — LIPID PANEL
CHOLESTEROL: 192 mg/dL (ref 0–200)
HDL: 44.1 mg/dL (ref >39.00)
LDL Cholesterol: 111 mg/dL — ABNORMAL HIGH (ref 0–99)
NonHDL: 147.9
Total CHOL/HDL Ratio: 4
Triglycerides: 187 mg/dL — ABNORMAL HIGH (ref 0.0–149.0)
VLDL: 37.4 mg/dL (ref 0.0–40.0)

## 2014-11-07 LAB — URINALYSIS, ROUTINE W REFLEX MICROSCOPIC
Bilirubin Urine: NEGATIVE
Ketones, ur: NEGATIVE
Leukocytes, UA: NEGATIVE
NITRITE: NEGATIVE
SPECIFIC GRAVITY, URINE: 1.025 (ref 1.000–1.030)
Total Protein, Urine: NEGATIVE
UROBILINOGEN UA: 0.2 (ref 0.0–1.0)
Urine Glucose: NEGATIVE
pH: 6 (ref 5.0–8.0)

## 2014-11-07 LAB — CBC WITH DIFFERENTIAL/PLATELET
BASOS PCT: 0.4 % (ref 0.0–3.0)
Basophils Absolute: 0 10*3/uL (ref 0.0–0.1)
EOS ABS: 0.1 10*3/uL (ref 0.0–0.7)
Eosinophils Relative: 1.5 % (ref 0.0–5.0)
HCT: 39.8 % (ref 36.0–46.0)
HEMOGLOBIN: 13.3 g/dL (ref 12.0–15.0)
Lymphocytes Relative: 33.4 % (ref 12.0–46.0)
Lymphs Abs: 1.9 10*3/uL (ref 0.7–4.0)
MCHC: 33.4 g/dL (ref 30.0–36.0)
MCV: 87.9 fl (ref 78.0–100.0)
MONO ABS: 0.5 10*3/uL (ref 0.1–1.0)
Monocytes Relative: 8.9 % (ref 3.0–12.0)
NEUTROS ABS: 3.2 10*3/uL (ref 1.4–7.7)
Neutrophils Relative %: 55.8 % (ref 43.0–77.0)
Platelets: 202 10*3/uL (ref 150.0–400.0)
RBC: 4.53 Mil/uL (ref 3.87–5.11)
RDW: 13.4 % (ref 11.5–15.5)
WBC: 5.7 10*3/uL (ref 4.0–10.5)

## 2014-11-07 LAB — BASIC METABOLIC PANEL
BUN: 15 mg/dL (ref 6–23)
CALCIUM: 9.1 mg/dL (ref 8.4–10.5)
CO2: 30 mEq/L (ref 19–32)
Chloride: 105 mEq/L (ref 96–112)
Creatinine, Ser: 0.68 mg/dL (ref 0.40–1.20)
GFR: 93.06 mL/min (ref >60.00)
Glucose, Bld: 82 mg/dL (ref 70–99)
POTASSIUM: 4.2 meq/L (ref 3.5–5.1)
SODIUM: 140 meq/L (ref 135–145)

## 2014-11-07 LAB — HEPATIC FUNCTION PANEL
ALT: 27 U/L (ref 0–35)
AST: 25 U/L (ref 0–37)
Albumin: 4 g/dL (ref 3.5–5.2)
Alkaline Phosphatase: 51 U/L (ref 39–117)
BILIRUBIN TOTAL: 0.3 mg/dL (ref 0.2–1.2)
Bilirubin, Direct: 0.1 mg/dL (ref 0.0–0.3)
Total Protein: 6.4 g/dL (ref 6.0–8.3)

## 2014-11-07 LAB — TSH: TSH: 1.26 u[IU]/mL (ref 0.35–4.50)

## 2014-11-14 ENCOUNTER — Ambulatory Visit (INDEPENDENT_AMBULATORY_CARE_PROVIDER_SITE_OTHER): Payer: 59 | Admitting: Podiatry

## 2014-11-14 ENCOUNTER — Encounter: Payer: Self-pay | Admitting: Podiatry

## 2014-11-14 ENCOUNTER — Encounter: Payer: Self-pay | Admitting: Internal Medicine

## 2014-11-14 ENCOUNTER — Ambulatory Visit (INDEPENDENT_AMBULATORY_CARE_PROVIDER_SITE_OTHER): Payer: 59

## 2014-11-14 ENCOUNTER — Ambulatory Visit (INDEPENDENT_AMBULATORY_CARE_PROVIDER_SITE_OTHER): Payer: 59 | Admitting: Internal Medicine

## 2014-11-14 VITALS — BP 104/70 | HR 65 | Temp 98.0°F | Ht 62.5 in | Wt 120.0 lb

## 2014-11-14 VITALS — BP 117/71 | HR 68 | Resp 14

## 2014-11-14 DIAGNOSIS — Z9889 Other specified postprocedural states: Secondary | ICD-10-CM

## 2014-11-14 DIAGNOSIS — Z0000 Encounter for general adult medical examination without abnormal findings: Secondary | ICD-10-CM

## 2014-11-14 DIAGNOSIS — M2011 Hallux valgus (acquired), right foot: Secondary | ICD-10-CM | POA: Diagnosis not present

## 2014-11-14 DIAGNOSIS — R3129 Other microscopic hematuria: Secondary | ICD-10-CM

## 2014-11-14 DIAGNOSIS — R312 Other microscopic hematuria: Secondary | ICD-10-CM

## 2014-11-14 NOTE — Progress Notes (Signed)
Pre visit review using our clinic review tool, if applicable. No additional management support is needed unless otherwise documented below in the visit note. 

## 2014-11-14 NOTE — Patient Instructions (Signed)
Please continue all other medications as before, and refills have been done if requested.  Please have the pharmacy call with any other refills you may need.  Please continue your efforts at being more active, low cholesterol diet, and weight control.  You are otherwise up to date with prevention measures today.  Please keep your appointments with your specialists as you may have planned You will be contacted regarding the referral for: urology  Please return in 1 year for your yearly visit, or sooner if needed, with Lab testing done 3-5 days before  

## 2014-11-14 NOTE — Assessment & Plan Note (Signed)

## 2014-11-14 NOTE — Progress Notes (Signed)
Subjective:    Patient ID: Beverly Shaw, female    DOB: [birth year], 64 y.o.   MRN: [id number]  HPI  Here for wellness and f/u;  Overall doing ok;  Pt denies Chest pain, worsening SOB, DOE, wheezing, orthopnea, PND, worsening LE edema, palpitations, dizziness or syncope.  Pt denies neurological change such as new headache, facial or extremity weakness.  Pt denies polydipsia, polyuria, or low sugar symptoms. Pt states overall good compliance with treatment and medications, good tolerability, and has been trying to follow appropriate diet.  Pt denies worsening depressive symptoms, suicidal ideation or panic. No fever, night sweats, wt loss, loss of appetite, or other constitutional symptoms.  Pt states good ability with ADL's, has low fall risk, home safety reviewed and adequate, no other significant changes in hearing or vision, and only occasionally active with exercise. Denies urinary symptoms such as dysuria, frequency, urgency, flank pain, hematuria or n/v, fever, chills. S/p bunionectomy right foot 2 mo ago, improving, may get left done later this yr.   Past Medical History  Diagnosis Date  . Migraines   . Positive TB test     Positive TB skin test  . GERD (gastroesophageal reflux disease)   . Heart murmur   . Episodic low back pain   . Osteopenia 10/15/2010  . Osteoporosis, unspecified 10/19/2012   Past Surgical History  Procedure Laterality Date  . Abdominal hysterectomy  1996  . Tubal ligation      reports that she has quit smoking. She has never used smokeless tobacco. She reports that she drinks alcohol. She reports that she does not use illicit drugs. family history includes Diabetes in her other; Heart disease in her father and mother. There is no history of Colon cancer, Pancreatic cancer, Rectal cancer, Stomach cancer, or Osteoporosis. No Known Allergies Current Outpatient Prescriptions on File Prior to Visit  Medication Sig Dispense Refill  . alendronate (FOSAMAX) 70 MG tablet  TAKE 1 TABLET BY MOUTH EVERY 7 DAYS. TAKE WITH A FULL GLASS OF WATER ON AN EMPTY STOMACH. 12 tablet 0  . aspirin 81 MG tablet Take 81 mg by mouth daily.    . Biotin 10 MG CAPS Take 10 mg by mouth.    . calcium carbonate (OS-CAL) 600 MG TABS Take 600 mg by mouth daily.      Marland Kitchen estradiol (VIVELLE-DOT) 0.075 MG/24HR Place 1 patch onto the skin 2 (two) times a week.    . Multiple Vitamin (MULTIVITAMIN) capsule Take 1 capsule by mouth daily.       No current facility-administered medications on file prior to visit.    Review of Systems  Constitutional: Negative for increased diaphoresis, other activity, appetite or siginficant weight change other than noted HENT: Negative for worsening hearing loss, ear pain, facial swelling, mouth sores and neck stiffness.   Eyes: Negative for other worsening pain, redness or visual disturbance.  Respiratory: Negative for shortness of breath and wheezing  Cardiovascular: Negative for chest pain and palpitations.  Gastrointestinal: Negative for diarrhea, blood in stool, abdominal distention or other pain Genitourinary: Negative for hematuria, flank pain or change in urine volume.  Musculoskeletal: Negative for myalgias or other joint complaints.  Skin: Negative for color change and wound or drainage.  Neurological: Negative for syncope and numbness. other than noted Hematological: Negative for adenopathy. or other swelling Psychiatric/Behavioral: Negative for hallucinations, SI, self-injury, decreased concentration or other worsening agitation.       Objective:   Physical Exam BP 104/70 mmHg  Pulse  65  Temp(Src) 98 F (36.7 C) (Oral)  Ht 5' 2.5" (1.588 m)  Wt 120 lb (54.432 kg)  BMI 21.59 kg/m2 VS noted,  Constitutional: Pt is oriented to person, place, and time. Appears well-developed and well-nourished, in no significant distress Head: Normocephalic and atraumatic.  Right Ear: External ear normal.  Left Ear: External ear normal.  Nose: Nose  normal.  Mouth/Throat: Oropharynx is clear and moist.  Eyes: Conjunctivae and EOM are normal. Pupils are equal, round, and reactive to light.  Neck: Normal range of motion. Neck supple. No JVD present. No tracheal deviation present or significant neck LA or mass Cardiovascular: Normal rate, regular rhythm, normal heart sounds and intact distal pulses.   Pulmonary/Chest: Effort normal and breath sounds without rales or wheezing  Abdominal: Soft. Bowel sounds are normal. NT. No HSM  Musculoskeletal: Normal range of motion. Exhibits no edema.  Lymphadenopathy:  Has no cervical adenopathy.  Neurological: Pt is alert and oriented to person, place, and time. Pt has normal reflexes. No cranial nerve deficit. Motor grossly intact Skin: Skin is warm and dry. No rash noted.  Psychiatric:  Has normal mood and affect. Behavior is normal.     Assessment & Plan:

## 2014-11-15 NOTE — Assessment & Plan Note (Signed)
Unclear significance, minor but persistent, for urology referral

## 2014-11-15 NOTE — Progress Notes (Signed)
Patient ID: Beverly Shaw, female   DOB: December 16, 1950, 64 y.o.   MRN: [id number]  DOS: 09/08/14 Right Liane Comber bunionectomy  Subjective: Ms. Beverly Shaw presents the office today  status post right foot [place] bunionectomy. She states that since last appointment she has returned to regular shoe when she is able to wear regular shoe without any difficulty. She states that she has no pain to the area and she is doing well. She is able to work without any pain. Denies any swelling and redness overlying the area. She denies any systemic complaints as fevers, chills, nausea, vomiting. Denies any calf pain, chest pain, shortness of breath. No other complaints at this time in no acute changes since last appointment.   Objective: AAO 3, NAD; presents in CAM walker  DP/PT pulses palpable, CRT less than 3 seconds Protective sensation intact with Simms Weinstein monofilament. Incision on the dorsal medial aspect of the right foot as well coapted without any evidence of dehiscence and a scar is formed. There is no edema, erythema, increase in warmth. There are no clinical signs of infection and this time. There is no tenderness along the surgical site. There is mild restriction of motion of the MTPJ however no pain with ROM. No other open lesions or pre-ulcerative lesions are identified bilaterally. No pain with calf compression, swelling, warmth, erythema.  Assessment: 64 year old female status post right foot Austin bunionectomy, doing well  Plan: -X-rays were obtained and reviewed with the patient.  -Treatment options discussed include alternatives, risks, complicaticontinue with regular shoe gear. She can start to increase her activity as tolerated.  -Continue with ROM exercises of the 1st MTPJ -Continue compression anklet as needed.  -Monitor for any signs or symptoms of infection or other, occasions. Directed to call the office immediately should any occur or go to the ER. -Follow-up as needed. Call if  questions or concerns in the meantime. Also instructed to call if there is any increase in pain, or any changes. She states she would like to have the left side done possibly in December.   Celesta Gentile, DPM

## 2014-11-20 ENCOUNTER — Encounter: Payer: Self-pay | Admitting: Internal Medicine

## 2014-12-18 ENCOUNTER — Other Ambulatory Visit: Payer: Self-pay

## 2014-12-18 MED ORDER — ALENDRONATE SODIUM 70 MG PO TABS: 70.0000 mg | ORAL_TABLET | ORAL | Status: DC

## 2015-02-16 ENCOUNTER — Other Ambulatory Visit (HOSPITAL_COMMUNITY): Payer: Self-pay | Admitting: Obstetrics and Gynecology

## 2015-02-16 DIAGNOSIS — Z1231 Encounter for screening mammogram for malignant neoplasm of breast: Secondary | ICD-10-CM

## 2015-02-19 ENCOUNTER — Ambulatory Visit (HOSPITAL_COMMUNITY)
Admission: RE | Admit: 2015-02-19 | Discharge: 2015-02-19 | Disposition: A | Discharge status: Home / Self Care | Payer: 59 | Source: Ambulatory Visit | Attending: Obstetrics and Gynecology | Admitting: Obstetrics and Gynecology

## 2015-02-19 DIAGNOSIS — Z1231 Encounter for screening mammogram for malignant neoplasm of breast: Secondary | ICD-10-CM | POA: Insufficient documentation

## 2015-03-25 ENCOUNTER — Ambulatory Visit (INDEPENDENT_AMBULATORY_CARE_PROVIDER_SITE_OTHER): Payer: 59 | Admitting: Internal Medicine

## 2015-03-25 ENCOUNTER — Other Ambulatory Visit: Payer: 59

## 2015-03-25 ENCOUNTER — Encounter: Payer: Self-pay | Admitting: Internal Medicine

## 2015-03-25 VITALS — BP 122/76 | HR 62 | Temp 97.7°F | Ht 62.0 in | Wt 121.0 lb

## 2015-03-25 DIAGNOSIS — R300 Dysuria: Secondary | ICD-10-CM

## 2015-03-25 LAB — POCT URINALYSIS DIPSTICK
Bilirubin, UA: NEGATIVE
Glucose, UA: NEGATIVE
KETONES UA: NEGATIVE
Nitrite, UA: NEGATIVE
PH UA: 6.5
PROTEIN UA: 15
RBC UA: 80
SPEC GRAV UA: 1.015
Urobilinogen, UA: 0.2

## 2015-03-25 MED ORDER — CEPHALEXIN 500 MG PO CAPS: 500.0000 mg | ORAL_CAPSULE | Freq: Four times a day (QID) | ORAL | Status: DC

## 2015-03-25 MED ORDER — CEFTRIAXONE SODIUM 500 MG IJ SOLR
500.0000 mg | Freq: Once | INTRAMUSCULAR | Status: AC
  Administered 2015-03-25: 500 mg via INTRAMUSCULAR

## 2015-03-25 NOTE — Assessment & Plan Note (Addendum)
.  Mild to mod, for antibx course for probable uti starting with rocephin IM in offic, then keflex course,  to f/u any worsening symptoms or concerns, also for urine cx

## 2015-03-25 NOTE — Addendum Note (Signed)
Addended by: Biagio Borg on: 03/25/2015 01:20 PM   Modules accepted: Miquel Dunn

## 2015-03-25 NOTE — Addendum Note (Signed)
Addended by: Lyman Bishop on: 03/25/2015 10:02 AM   Modules accepted: Orders

## 2015-03-25 NOTE — Progress Notes (Signed)
Pre visit review using our clinic review tool, if applicable. No additional management support is needed unless otherwise documented below in the visit note. 

## 2015-03-25 NOTE — Patient Instructions (Signed)
You had the antibiotic shot today in the office  Please take all new medication as prescribed - the pill antibiotics  The specimen will be sent for culture  You will be contacted by phone if any changes need to be made.   Please continue all other medications as before, and refills have been done if requested.  Please have the pharmacy call with any other refills you may need.  Please keep your appointments with your specialists as you may have planned

## 2015-03-25 NOTE — Progress Notes (Addendum)
Subjective:    Patient ID: Beverly Shaw, female    DOB: [birth year], 64 y.o.   MRN: [id number]  HPI  Here to f/u, 1 day with GU symptoms with onset pain and dysuria, freq, urgnecy and one episode hematuria, several nocturia., also fever this am at 100.8 last PM,- Denies urinary symptoms such as flank pain, or n/v.  Pt denies chest pain, increased sob or doe, wheezing, orthopnea, PND, increased LE swelling, palpitations, dizziness or syncope.   Pt denies polydipsia, polyuria.  Is here on break from work, plans to go back. Past Medical History  Diagnosis Date  . Migraines   . Positive TB test     Positive TB skin test  . GERD (gastroesophageal reflux disease)   . Heart murmur   . Episodic low back pain   . Osteopenia 10/15/2010  . Osteoporosis, unspecified 10/19/2012   Past Surgical History  Procedure Laterality Date  . Abdominal hysterectomy  1996  . Tubal ligation      reports that she has quit smoking. She has never used smokeless tobacco. She reports that she drinks alcohol. She reports that she does not use illicit drugs. family history includes Diabetes in her other; Heart disease in her father and mother. There is no history of Colon cancer, Pancreatic cancer, Rectal cancer, Stomach cancer, or Osteoporosis. No Known Allergies Current Outpatient Prescriptions on File Prior to Visit  Medication Sig Dispense Refill  . alendronate (FOSAMAX) 70 MG tablet Take 1 tablet (70 mg total) by mouth once a week. Take with a full glass of water on an empty stomach. 12 tablet 1  . aspirin 81 MG tablet Take 81 mg by mouth daily.    . Biotin 10 MG CAPS Take 10 mg by mouth.    . calcium carbonate (OS-CAL) 600 MG TABS Take 600 mg by mouth daily.      Marland Kitchen estradiol (VIVELLE-DOT) 0.075 MG/24HR Place 1 patch onto the skin 2 (two) times a week.    . Multiple Vitamin (MULTIVITAMIN) capsule Take 1 capsule by mouth daily.       No current facility-administered medications on file prior to visit.   Review  of Systems  All otherwise neg per pt    Objective:   Physical Exam BP 122/76 mmHg  Pulse 62  Temp(Src) 97.7 F (36.5 C) (Oral)  Ht 5\' 2"  (1.575 m)  Wt 121 lb (54.885 kg)  BMI 22.13 kg/m2  SpO2 99% VS noted, mild ill Constitutional: Pt appears in no significant distress HENT: Head: NCAT.  Right Ear: External ear normal.  Left Ear: External ear normal.  Eyes: . Pupils are equal, round, and reactive to light. Conjunctivae and EOM are normal Neck: Normal range of motion. Neck supple.  Cardiovascular: Normal rate and regular rhythm.   Pulmonary/Chest: Effort normal and breath sounds without rales or wheezing.  Abd:  Soft,, ND, + BS with low mid abd tender, no guarding or rebound, no flank tender Neurological: Pt is alert. Not confused , motor grossly intact Skin: Skin is warm. No rash, no LE edema Psychiatric: Pt behavior is normal. No agitation.   POCT urinalysis dipstick  Status: Finalresult Visible to patient:  Not Released Dx:  Dysuria              Ref Range 9:07 AM  39mo ago  25yr ago  84yr ago     Color, UA  amber       Clarity, UA  cloudy  Glucose, UA  neg       Bilirubin, UA  neg       Ketones, UA  neg       Spec Grav, UA  1.015       Blood, UA  80       pH, UA  6.5       Protein, UA  15       Urobilinogen, UA  0.2 0.2 0.2 0.2    Nitrite, UA  neg       Leukocytes, UA Negative  moderate (2+) (A) NEGATIVE              Assessment & Plan:

## 2015-03-28 ENCOUNTER — Telehealth: Payer: Self-pay | Admitting: Internal Medicine

## 2015-03-28 LAB — URINE CULTURE

## 2015-03-28 MED ORDER — CIPROFLOXACIN HCL 500 MG PO TABS: 500.0000 mg | ORAL_TABLET | Freq: Two times a day (BID) | ORAL | Status: DC

## 2015-03-28 NOTE — Telephone Encounter (Signed)
Spoke with pt by phone; urine cx sens's indicate resistance to cephalexin  Pt states however all symptoms rapidly improved, no longer has dysuria or other, no fever or pain  OK to cont cephalexin for now, though I will send rx to [facility] for cipro course if she develops worsening symptoms   [facility] pharmacy not open until Mon Nov 7; so I encouraged pt if she worsens before Monday to call the answering service, where a nurse oncall service would be able to call the cipro to a pharmacy of her choice

## 2015-06-08 ENCOUNTER — Other Ambulatory Visit: Payer: Self-pay | Admitting: Internal Medicine

## 2015-06-08 MED FILL — ALENDRONATE NA 70 MG TAB: 70 | 84 days supply | Qty: 12 | Fill #0

## 2015-08-31 MED FILL — ALENDRONATE NA 70 MG TAB: 70 | 84 days supply | Qty: 12 | Fill #1

## 2015-09-09 ENCOUNTER — Ambulatory Visit (INDEPENDENT_AMBULATORY_CARE_PROVIDER_SITE_OTHER): Payer: 59 | Admitting: Internal Medicine

## 2015-09-09 ENCOUNTER — Encounter: Payer: Self-pay | Admitting: Internal Medicine

## 2015-09-09 VITALS — BP 134/78 | HR 56 | Temp 97.9°F | Resp 20 | Wt 120.0 lb

## 2015-09-09 DIAGNOSIS — L0293 Carbuncle, unspecified: Secondary | ICD-10-CM | POA: Diagnosis not present

## 2015-09-09 MED ORDER — DOXYCYCLINE HYCLATE 100 MG PO TABS: 100.0000 mg | ORAL_TABLET | Freq: Two times a day (BID) | ORAL | Status: DC

## 2015-09-09 MED ORDER — MUPIROCIN CALCIUM 2 % NA OINT
1.0000 | TOPICAL_OINTMENT | Freq: Two times a day (BID) | NASAL | Status: DC
Start: 2015-09-09 — End: 2015-11-18

## 2015-09-09 MED FILL — MUPIROCIN 2% OINTMENT: 2 | 5 days supply | Qty: 22 | Fill #0

## 2015-09-09 MED FILL — DOXYCYCLINE HYCLATE 100 MG: 100 | 10 days supply | Qty: 20 | Fill #0

## 2015-09-09 NOTE — Progress Notes (Signed)
Subjective:    Patient ID: Beverly Shaw, female    DOB: 1950-09-11, 65 y.o.   MRN: [id number]  HPI  Here to f/u, c/o 1 wk onset 2boils to right axilla mild, sharp pain but no fever and pain improved after first one with spontaneous drainage, seemed to be getting better overall, but now with similar erythem boil like lesion coming up for second time in the past 2 days.  No hx of MRSA or nasal sores.  Pt denies chest pain, increased sob or doe, wheezing, orthopnea, PND, increased LE swelling, palpitations, dizziness or syncope.  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 Past Medical History  Diagnosis Date  . Migraines   . Positive TB test     Positive TB skin test  . GERD (gastroesophageal reflux disease)   . Heart murmur   . Episodic low back pain   . Osteopenia 10/15/2010  . Osteoporosis, unspecified 10/19/2012   Past Surgical History  Procedure Laterality Date  . Abdominal hysterectomy  1996  . Tubal ligation      reports that she has quit smoking. She has never used smokeless tobacco. She reports that she drinks alcohol. She reports that she does not use illicit drugs. family history includes Diabetes in her other; Heart disease in her father and mother. There is no history of Colon cancer, Pancreatic cancer, Rectal cancer, Stomach cancer, or Osteoporosis. No Known Allergies Current Outpatient Prescriptions on File Prior to Visit  Medication Sig Dispense Refill  . alendronate (FOSAMAX) 70 MG tablet TAKE 1 TABLET BY MOUTH ONCE A WEEK WITH A FULL GLASS OF WATER ON AN EMPTY STOMACH 12 tablet 1  . aspirin 81 MG tablet Take 81 mg by mouth daily.    . Biotin 10 MG CAPS Take 10 mg by mouth.    . calcium carbonate (OS-CAL) 600 MG TABS Take 600 mg by mouth daily.      . cephALEXin (KEFLEX) 500 MG capsule  Take 1 capsule (500 mg total) by mouth 4 (four) times daily. 40 capsule 0  . ciprofloxacin (CIPRO) 500 MG tablet Take 1 tablet (500 mg total) by mouth 2 (two) times daily. 20 tablet 0  . estradiol (VIVELLE-DOT) 0.075 MG/24HR Place 1 patch onto the skin 2 (two) times a week.    . Multiple Vitamin (MULTIVITAMIN) capsule Take 1 capsule by mouth daily.       No current facility-administered medications on file prior to visit.   Review of Systems  All otherwise neg per pt      Objective:   Physical Exam BP 134/78 mmHg  Pulse 56  Temp(Src) 97.9 F (36.6 C) (Oral)  Resp 20  Wt 120 lb (54.432 kg)  SpO2 98% VS noted,  Constitutional: Pt appears in no apparent distress HENT: Head: NCAT.  Right Ear: External ear normal.  Left Ear: External ear normal.  Eyes: . Pupils are equal, round, and reactive to light. Conjunctivae and EOM are normal Neck: Normal range of motion. Neck supple.  Cardiovascular: Normal rate and regular rhythm.   Pulmonary/Chest: Effort normal and breath sounds without rales or wheezing.  Abd:  Soft, NT, ND, + BS Neurological: Pt is alert. Not confused , motor grossly intact Skin: Skin is warm. no LE edema, but right axilla with 1 cm area  red/tender swelling, about 1 cm away from the initial boil site now with slight erythema only.  Psychiatric: Pt behavior is normal. No agitation.     Assessment & Plan:

## 2015-09-09 NOTE — Patient Instructions (Signed)
Please take all new medication as prescribed - the pills and the ointment  Please wash all the clothes in hot water at home at the same time  Please use alcohol at work if you are exposed to clinical areas  Please continue all other medications as before, and refills have been done if requested.  Please have the pharmacy call with any other refills you may need.  Please keep your appointments with your specialists as you may have planned

## 2015-09-09 NOTE — Progress Notes (Signed)
Pre visit review using our clinic review tool, if applicable. No additional management support is needed unless otherwise documented below in the visit note. 

## 2015-09-11 NOTE — Assessment & Plan Note (Signed)
Mild to mod, cant r/o MRSA, for antibx course including doxycycline and mupirocin,  to f/u any worsening symptoms or concerns

## 2015-11-04 DIAGNOSIS — D229 Melanocytic nevi, unspecified: Secondary | ICD-10-CM | POA: Diagnosis not present

## 2015-11-04 DIAGNOSIS — D224 Melanocytic nevi of scalp and neck: Secondary | ICD-10-CM | POA: Diagnosis not present

## 2015-11-04 DIAGNOSIS — D2271 Melanocytic nevi of right lower limb, including hip: Secondary | ICD-10-CM | POA: Diagnosis not present

## 2015-11-04 DIAGNOSIS — L821 Other seborrheic keratosis: Secondary | ICD-10-CM | POA: Diagnosis not present

## 2015-11-04 DIAGNOSIS — D1800 Hemangioma unspecified site: Secondary | ICD-10-CM | POA: Diagnosis not present

## 2015-11-04 DIAGNOSIS — L814 Other melanin hyperpigmentation: Secondary | ICD-10-CM | POA: Diagnosis not present

## 2015-11-04 DIAGNOSIS — D225 Melanocytic nevi of trunk: Secondary | ICD-10-CM | POA: Diagnosis not present

## 2015-11-18 ENCOUNTER — Ambulatory Visit (INDEPENDENT_AMBULATORY_CARE_PROVIDER_SITE_OTHER): Payer: 59 | Admitting: Internal Medicine

## 2015-11-18 ENCOUNTER — Other Ambulatory Visit (INDEPENDENT_AMBULATORY_CARE_PROVIDER_SITE_OTHER): Payer: 59

## 2015-11-18 ENCOUNTER — Encounter: Payer: Self-pay | Admitting: Internal Medicine

## 2015-11-18 VITALS — BP 122/72 | HR 60 | Temp 98.0°F | Resp 20 | Wt 127.0 lb

## 2015-11-18 DIAGNOSIS — N261 Atrophy of kidney (terminal): Secondary | ICD-10-CM | POA: Insufficient documentation

## 2015-11-18 DIAGNOSIS — M79642 Pain in left hand: Secondary | ICD-10-CM

## 2015-11-18 DIAGNOSIS — Z1159 Encounter for screening for other viral diseases: Secondary | ICD-10-CM | POA: Diagnosis not present

## 2015-11-18 DIAGNOSIS — M79641 Pain in right hand: Secondary | ICD-10-CM | POA: Insufficient documentation

## 2015-11-18 DIAGNOSIS — G8929 Other chronic pain: Secondary | ICD-10-CM | POA: Insufficient documentation

## 2015-11-18 DIAGNOSIS — Z0001 Encounter for general adult medical examination with abnormal findings: Secondary | ICD-10-CM | POA: Diagnosis not present

## 2015-11-18 DIAGNOSIS — M545 Low back pain: Secondary | ICD-10-CM | POA: Diagnosis not present

## 2015-11-18 DIAGNOSIS — H9193 Unspecified hearing loss, bilateral: Secondary | ICD-10-CM | POA: Diagnosis not present

## 2015-11-18 DIAGNOSIS — E785 Hyperlipidemia, unspecified: Secondary | ICD-10-CM | POA: Diagnosis not present

## 2015-11-18 DIAGNOSIS — Z0000 Encounter for general adult medical examination without abnormal findings: Secondary | ICD-10-CM

## 2015-11-18 DIAGNOSIS — R7989 Other specified abnormal findings of blood chemistry: Secondary | ICD-10-CM

## 2015-11-18 HISTORY — DX: Atrophy of kidney (terminal): N26.1

## 2015-11-18 LAB — URINALYSIS, ROUTINE W REFLEX MICROSCOPIC
Bilirubin Urine: NEGATIVE
KETONES UR: NEGATIVE
LEUKOCYTES UA: NEGATIVE
NITRITE: NEGATIVE
PH: 6.5 (ref 5.0–8.0)
SPECIFIC GRAVITY, URINE: 1.015 (ref 1.000–1.030)
Total Protein, Urine: NEGATIVE
Urine Glucose: NEGATIVE
Urobilinogen, UA: 0.2 (ref 0.0–1.0)

## 2015-11-18 LAB — BASIC METABOLIC PANEL
BUN: 17 mg/dL (ref 6–23)
CHLORIDE: 107 meq/L (ref 96–112)
CO2: 28 meq/L (ref 19–32)
Calcium: 9.2 mg/dL (ref 8.4–10.5)
Creatinine, Ser: 0.76 mg/dL (ref 0.40–1.20)
GFR: 81.58 mL/min (ref >60.00)
GLUCOSE: 93 mg/dL (ref 70–99)
POTASSIUM: 4.3 meq/L (ref 3.5–5.1)
Sodium: 141 mEq/L (ref 135–145)

## 2015-11-18 LAB — CBC WITH DIFFERENTIAL/PLATELET
Basophils Absolute: 0 10*3/uL (ref 0.0–0.1)
Basophils Relative: 0.3 % (ref 0.0–3.0)
EOS ABS: 0.1 10*3/uL (ref 0.0–0.7)
Eosinophils Relative: 1.4 % (ref 0.0–5.0)
HCT: 39.3 % (ref 36.0–46.0)
HEMOGLOBIN: 13.3 g/dL (ref 12.0–15.0)
LYMPHS ABS: 2 10*3/uL (ref 0.7–4.0)
Lymphocytes Relative: 33.8 % (ref 12.0–46.0)
MCHC: 33.9 g/dL (ref 30.0–36.0)
MCV: 86.5 fl (ref 78.0–100.0)
MONO ABS: 0.6 10*3/uL (ref 0.1–1.0)
Monocytes Relative: 10.2 % (ref 3.0–12.0)
NEUTROS PCT: 54.3 % (ref 43.0–77.0)
Neutro Abs: 3.2 10*3/uL (ref 1.4–7.7)
Platelets: 192 10*3/uL (ref 150.0–400.0)
RBC: 4.54 Mil/uL (ref 3.87–5.11)
RDW: 13.3 % (ref 11.5–15.5)
WBC: 5.8 10*3/uL (ref 4.0–10.5)

## 2015-11-18 LAB — HEPATIC FUNCTION PANEL
ALBUMIN: 4.2 g/dL (ref 3.5–5.2)
ALK PHOS: 47 U/L (ref 39–117)
ALT: 23 U/L (ref 0–35)
AST: 26 U/L (ref 0–37)
Bilirubin, Direct: 0.1 mg/dL (ref 0.0–0.3)
TOTAL PROTEIN: 6.8 g/dL (ref 6.0–8.3)
Total Bilirubin: 0.5 mg/dL (ref 0.2–1.2)

## 2015-11-18 LAB — LIPID PANEL
CHOLESTEROL: 217 mg/dL — AB (ref 0–200)
HDL: 48.7 mg/dL (ref >39.00)
NonHDL: 167.8
Total CHOL/HDL Ratio: 4
Triglycerides: 266 mg/dL — ABNORMAL HIGH (ref 0.0–149.0)
VLDL: 53.2 mg/dL — AB (ref 0.0–40.0)

## 2015-11-18 LAB — LDL CHOLESTEROL, DIRECT: LDL DIRECT: 113 mg/dL

## 2015-11-18 LAB — TSH: TSH: 1.46 u[IU]/mL (ref 0.35–4.50)

## 2015-11-18 NOTE — Patient Instructions (Signed)
Your ears were irrigated of wax today  Please continue all other medications as before, and refills have been done if requested.  Please have the pharmacy call with any other refills you may need.  Please continue your efforts at being more active, low cholesterol diet, and weight control.  You are otherwise up to date with prevention measures today.  Please keep your appointments with your specialists as you may have planned  Please consider seeing Dr Tamala Julian in this office (sports medicine) if your shoulder or back pain are worse  Please return in 1 year for your yearly visit, or sooner if needed, with Lab testing done 3-5 days before

## 2015-11-18 NOTE — Progress Notes (Signed)
Pre visit review using our clinic review tool, if applicable. No additional management support is needed unless otherwise documented below in the visit note. 

## 2015-11-18 NOTE — Progress Notes (Signed)
Subjective:    Patient ID: Beverly Shaw, female    DOB: 1950-11-03, 65 y.o.   MRN: [id number]  HPI  Here for wellness and f/u;  Overall doing ok;  Pt denies Chest pain, worsening SOB, DOE, wheezing, orthopnea, PND, worsening LE edema, palpitations, dizziness or syncope.  Pt denies neurological change such as new headache, facial or extremity weakness.  Pt denies polydipsia, polyuria, or low sugar symptoms. Pt states overall good compliance with treatment and medications, good tolerability, and has been trying to follow appropriate diet.  Pt denies worsening depressive symptoms, suicidal ideation or panic. No fever, night sweats, wt loss, loss of appetite, or other constitutional symptoms.  Pt states good ability with ADL's, has low fall risk, home safety reviewed and adequate, no other significant changes in hearing or vision, and only occasionally active with exercise.    Chronic left shoulder pain, crepitus with ROM, without neck pain or radicular pain, no extermity weakness.  Pt continues to have recurring LBP without change in severity, bowel or bladder change, fever, wt loss,  worsening LE pain/numbness/weakness, gait change or falls.  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  Also has + bilat hearing loss for 1 wk - ?  Wax, has had this issue in the past.  No HA, ST, cough, fever,, ear d/c, or sinus congestion.    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 Migraines   . Positive TB test     Positive TB skin test  . GERD (gastroesophageal reflux disease)   . Heart murmur   . Episodic low back pain   . Osteopenia 10/15/2010  . Osteoporosis, unspecified 10/19/2012  . Atrophic kidney 11/18/2015   Past Surgical History  Procedure Laterality Date  . Abdominal hysterectomy  1996  . Tubal ligation      reports that she has quit smoking.  She has never used smokeless tobacco. She reports that she drinks alcohol. She reports that she does not use illicit drugs. family history includes Diabetes in her other; Heart disease in her father and mother. There is no history of Colon cancer, Pancreatic cancer, Rectal cancer, Stomach cancer, or Osteoporosis. No Known Allergies Current Outpatient Prescriptions on File Prior to Visit  Medication Sig Dispense Refill  . alendronate (FOSAMAX) 70 MG tablet TAKE 1 TABLET BY MOUTH ONCE A WEEK WITH A FULL GLASS OF WATER ON AN EMPTY STOMACH 12 tablet 1  . aspirin 81 MG tablet Take 81 mg by mouth daily.    . Biotin 10 MG CAPS Take 10 mg by mouth.    . calcium carbonate (OS-CAL) 600 MG TABS Take 600 mg by mouth daily.      Marland Kitchen estradiol (VIVELLE-DOT) 0.075 MG/24HR Place 1 patch onto the skin 2 (two) times a week.    . Multiple Vitamin (MULTIVITAMIN) capsule Take 1 capsule by mouth daily.       No current facility-administered medications on file prior to visit.   Review of Systems Constitutional: Negative for increased diaphoresis, or other activity, appetite or siginficant weight change other than noted HENT: Negative for worsening hearing loss, ear pain, facial swelling, mouth sores and neck stiffness.   Eyes: Negative for other worsening pain, redness or visual disturbance.  Respiratory: Negative for choking or stridor Cardiovascular: Negative for other chest pain and palpitations.  Gastrointestinal: Negative for worsening diarrhea,  blood in stool, or abdominal distention Genitourinary: Negative for hematuria, flank pain or change in urine volume.  Musculoskeletal: Negative for myalgias or other joint complaints.  Skin: Negative for other color change and wound or drainage.  Neurological: Negative for syncope and numbness. other than noted Hematological: Negative for adenopathy. or other swelling Psychiatric/Behavioral: Negative for hallucinations, SI, self-injury, decreased concentration or  other worsening agitation.      Objective:   Physical Exam  BP 122/72 mmHg  Pulse 60  Temp(Src) 98 F (36.7 C) (Oral)  Resp 20  Wt 127 lb (57.607 kg)  SpO2 99% VS noted,  Constitutional: Pt is oriented to person, place, and time. Appears well-developed and well-nourished, in no significant distress Head: Normocephalic and atraumatic  Eyes: Conjunctivae and EOM are normal. Pupils are equal, round, and reactive to light Right Ear: External ear normal. Bilat wax impactions resolved with irrigation, hearing improved Left Ear: External ear normal Nose: Nose normal.  Mouth/Throat: Oropharynx is clear and moist  Neck: Normal range of motion. Neck supple. No JVD present. No tracheal deviation present or significant neck LA or mass Cardiovascular: Normal rate, regular rhythm, normal heart sounds and intact distal pulses.   Pulmonary/Chest: Effort normal and breath sounds without rales or wheezing  Abdominal: Soft. Bowel sounds are normal. NT. No HSM  Musculoskeletal: Normal range of motion. Exhibits no edema Lymphadenopathy: Has no cervical adenopathy.  Spine mild tender lowest lumbar midline without swelling or erythema Left shoulder wit mild crepitus, decreased ROM, no pain on ROM today Neurological: Pt is alert and oriented to person, place, and time. Pt has normal reflexes. No cranial nerve deficit. Motor grossly intact Skin: Skin is warm and dry. No rash noted or new ulcers Psychiatric:  Has normal mood and affect. Behavior is normal.  Lab Results  Component Value Date   WBC 5.8 11/18/2015   HGB 13.3 11/18/2015   HCT 39.3 11/18/2015   PLT 192.0 11/18/2015   GLUCOSE 93 11/18/2015   CHOL 217* 11/18/2015   TRIG 266.0* 11/18/2015   HDL 48.70 11/18/2015   LDLDIRECT 113.0 11/18/2015   LDLCALC 111* 11/07/2014   ALT 23 11/18/2015   AST 26 11/18/2015   NA 141 11/18/2015   K 4.3 11/18/2015   CL 107 11/18/2015   CREATININE 0.76 11/18/2015   BUN 17 11/18/2015   CO2 28 11/18/2015    TSH 1.46 11/18/2015   HGBA1C 5.4 10/16/2013   Most recent mammogram  CLINICAL DATA: Screening.  EXAM: DIGITAL SCREENING BILATERAL MAMMOGRAM WITH CAD  COMPARISON: Previous exam(s).  ACR Breast Density Category c: The breast tissue is heterogeneously dense, which may obscure small masses.  FINDINGS: There are no findings suspicious for malignancy. Images were processed with CAD.  IMPRESSION: No mammographic evidence of malignancy. A result letter of this screening mammogram will be mailed directly to the patient.  RECOMMENDATION: Screening mammogram in one year. (Code:[id number]-B-01Y)  BI-RADS CATEGORY 1: Negative.   Electronically Signed  By: Lovey Newcomer M.D.  On: 02/20/2015 12:24    Assessment & Plan:

## 2015-11-19 DIAGNOSIS — D224 Melanocytic nevi of scalp and neck: Secondary | ICD-10-CM | POA: Diagnosis not present

## 2015-11-22 NOTE — Assessment & Plan Note (Addendum)
With bilat wax impactions resolved with irrigation,  to f/u any worsening symptoms or concerns  In addition to the time spent performing CPE, I spent an additional 15 minutes face to face,in which greater than 50% of this time was spent in counseling and coordination of care for patient's illness as documented.

## 2015-11-22 NOTE — Assessment & Plan Note (Signed)
C/w DJD, offered volt gel , declines for now,  to f/u any worsening symptoms or concerns

## 2015-11-22 NOTE — Assessment & Plan Note (Signed)
Stable, also With ongoing right shoulder pain, currently not work limiting, declines sport med referral for now,  to f/u any worsening symptoms or concerns

## 2015-11-22 NOTE — Assessment & Plan Note (Signed)

## 2015-11-22 NOTE — Assessment & Plan Note (Signed)
stable overall by history and exam, recent data reviewed with pt, and pt to continue medical treatment as before,  to f/u any worsening symptoms or concerns Lab Results  Component Value Date   LDLCALC 111* 11/07/2014

## 2015-12-15 ENCOUNTER — Other Ambulatory Visit: Payer: Self-pay | Admitting: Internal Medicine

## 2015-12-15 MED FILL — ALENDRONATE NA 70 MG TAB: 70 | 84 days supply | Qty: 12 | Fill #0

## 2016-02-11 ENCOUNTER — Other Ambulatory Visit: Payer: Self-pay | Admitting: Obstetrics and Gynecology

## 2016-02-11 DIAGNOSIS — Z1231 Encounter for screening mammogram for malignant neoplasm of breast: Secondary | ICD-10-CM

## 2016-02-26 ENCOUNTER — Ambulatory Visit: Payer: 59

## 2016-03-01 ENCOUNTER — Ambulatory Visit
Admission: RE | Admit: 2016-03-01 | Discharge: 2016-03-01 | Disposition: A | Discharge status: Home / Self Care | Payer: 59 | Source: Ambulatory Visit | Attending: Obstetrics and Gynecology | Admitting: Obstetrics and Gynecology

## 2016-03-01 DIAGNOSIS — Z1231 Encounter for screening mammogram for malignant neoplasm of breast: Secondary | ICD-10-CM

## 2016-03-29 MED FILL — ALENDRONATE NA 70 MG TAB: 70 | 84 days supply | Qty: 12 | Fill #1

## 2016-05-24 DIAGNOSIS — Z01419 Encounter for gynecological examination (general) (routine) without abnormal findings: Secondary | ICD-10-CM | POA: Diagnosis not present

## 2016-05-24 DIAGNOSIS — Z1151 Encounter for screening for human papillomavirus (HPV): Secondary | ICD-10-CM | POA: Diagnosis not present

## 2016-05-24 DIAGNOSIS — R87610 Atypical squamous cells of undetermined significance on cytologic smear of cervix (ASC-US): Secondary | ICD-10-CM | POA: Diagnosis not present

## 2016-06-20 DIAGNOSIS — R3129 Other microscopic hematuria: Secondary | ICD-10-CM | POA: Diagnosis not present

## 2016-06-20 DIAGNOSIS — N261 Atrophy of kidney (terminal): Secondary | ICD-10-CM | POA: Diagnosis not present

## 2016-07-04 ENCOUNTER — Other Ambulatory Visit: Payer: Self-pay | Admitting: Internal Medicine

## 2016-07-04 MED FILL — ALENDRONATE NA 70 MG TAB: 70 | 84 days supply | Qty: 12 | Fill #0

## 2016-07-11 ENCOUNTER — Telehealth: Payer: 59 | Admitting: Nurse Practitioner

## 2016-07-11 DIAGNOSIS — J019 Acute sinusitis, unspecified: Secondary | ICD-10-CM

## 2016-07-11 MED ORDER — FLUTICASONE PROPIONATE 50 MCG/ACT NA SUSP: 2.0000 | Freq: Every day | NASAL | 0 refills | Status: DC

## 2016-07-11 MED ORDER — AMOXICILLIN-POT CLAVULANATE 875-125 MG PO TABS: 1.0000 | ORAL_TABLET | Freq: Two times a day (BID) | ORAL | 0 refills | Status: AC

## 2016-07-11 MED FILL — FLUTICASONE PROP 50 MCG SPR: 50 | 30 days supply | Qty: 16 | Fill #0

## 2016-07-11 MED FILL — AMOX TR-K CLV 875-125 MG TA: 875-125 | 10 days supply | Qty: 20 | Fill #0

## 2016-07-11 NOTE — Progress Notes (Signed)
We are sorry that you are not feeling well. Here is how we plan to help!   Based on what you have shared with me it looks like you have sinusitis. Sinusitis is inflammation and infection in the sinus cavities of the head. Based on your presentation I believe you may have Acute Bacterial Sinusitis.This is an infection most likely caused by a bacteria. You may use an oral decongestant such as Mucinex D or if you have glaucoma or high blood pressure use plain Mucinex. Saline nasal spray help and can safely be used as often as needed for congestion, I have prescribed: Fluticasone nasal spray two sprays in each nostril twice a day. Because your symptoms are worsening, I am also prescribing Amoxicillin/Clavulanic Acid 875/125mg  twice daily for 10 days.   Some authorities believe that zinc sprays or the use of Echinacea may shorten the course of your symptoms.   Sinus infections are not as easily transmitted as other respiratory infection, however we still recommend that you avoid close contact with loved ones, especially the very young and elderly. Remember to wash your hands thoroughly throughout the day as this is the number one way to prevent the spread of infection!   Home Care:  Only take medications as instructed by your medical team.  Complete the entire course of an antibiotic.  Do not take these medications with alcohol.  A steam or ultrasonic humidifier can help congestion. You can place a towel over your head and breathe in the steam from hot water coming from a faucet.  Avoid close contacts especially the very young and the elderly.  Cover your mouth when you cough or sneeze.  Always remember to wash your hands.   Get Help Right Away If:  You develop worsening fever or sinus pain.  You develop a severe head ache or visual changes.  Your symptoms persist after you have completed your treatment plan.   Make sure you  Understand these instructions.  Will watch your condition.  Will  get help right away if you are not doing well or get worse.   Your e-visit answers were reviewed by a board certified advanced clinical practitioner to complete your personal care plan. Depending on the condition, your plan could have included both over the counter or prescription medications.   If there is a problem please reply once you have received a response from your provider.   Your safety is important to [place]. If you have drug allergies check your prescription carefully.    You can use MyChart to ask questions about today's visit, request a non-urgent call back, or ask for a work or school excuse for 24 hours related to this e-Visit. If it has been greater than 24 hours you will need to follow up with your provider, or enter a new e-Visit to address those concerns.   You will get an e-mail in the next two days asking about your experience. I hope that your e-visit has been valuable and will speed your recovery. Thank you for using e-visits.

## 2016-07-12 ENCOUNTER — Ambulatory Visit: Payer: 59 | Admitting: Internal Medicine

## 2016-09-28 DIAGNOSIS — D2262 Melanocytic nevi of left upper limb, including shoulder: Secondary | ICD-10-CM | POA: Diagnosis not present

## 2016-09-28 DIAGNOSIS — D1800 Hemangioma unspecified site: Secondary | ICD-10-CM | POA: Diagnosis not present

## 2016-09-28 DIAGNOSIS — D2271 Melanocytic nevi of right lower limb, including hip: Secondary | ICD-10-CM | POA: Diagnosis not present

## 2016-09-28 DIAGNOSIS — L814 Other melanin hyperpigmentation: Secondary | ICD-10-CM | POA: Diagnosis not present

## 2016-09-28 DIAGNOSIS — L821 Other seborrheic keratosis: Secondary | ICD-10-CM | POA: Diagnosis not present

## 2016-09-28 DIAGNOSIS — D229 Melanocytic nevi, unspecified: Secondary | ICD-10-CM | POA: Diagnosis not present

## 2016-09-28 DIAGNOSIS — D225 Melanocytic nevi of trunk: Secondary | ICD-10-CM | POA: Diagnosis not present

## 2016-09-28 DIAGNOSIS — L237 Allergic contact dermatitis due to plants, except food: Secondary | ICD-10-CM | POA: Diagnosis not present

## 2016-09-28 MED FILL — CLOBETASOL 0.05% CREAM: 0.05 | 14 days supply | Qty: 60 | Fill #0

## 2016-10-20 MED FILL — ALENDRONATE NA 70 MG TAB: 70 | 84 days supply | Qty: 12 | Fill #1

## 2016-11-14 ENCOUNTER — Other Ambulatory Visit (INDEPENDENT_AMBULATORY_CARE_PROVIDER_SITE_OTHER): Payer: 59

## 2016-11-14 DIAGNOSIS — R7989 Other specified abnormal findings of blood chemistry: Secondary | ICD-10-CM

## 2016-11-14 DIAGNOSIS — Z0001 Encounter for general adult medical examination with abnormal findings: Secondary | ICD-10-CM | POA: Diagnosis not present

## 2016-11-14 DIAGNOSIS — Z1159 Encounter for screening for other viral diseases: Secondary | ICD-10-CM | POA: Diagnosis not present

## 2016-11-14 LAB — URINALYSIS, ROUTINE W REFLEX MICROSCOPIC
Bilirubin Urine: NEGATIVE
HGB URINE DIPSTICK: NEGATIVE
KETONES UR: NEGATIVE
Leukocytes, UA: NEGATIVE
NITRITE: NEGATIVE
Specific Gravity, Urine: 1.01 (ref 1.000–1.030)
Total Protein, Urine: NEGATIVE
URINE GLUCOSE: NEGATIVE
Urobilinogen, UA: 0.2 (ref 0.0–1.0)
pH: 7 (ref 5.0–8.0)

## 2016-11-14 LAB — LIPID PANEL
CHOL/HDL RATIO: 5
CHOLESTEROL: 207 mg/dL — AB (ref 0–200)
HDL: 45.8 mg/dL (ref >39.00)
NONHDL: 161.05
Triglycerides: 209 mg/dL — ABNORMAL HIGH (ref 0.0–149.0)
VLDL: 41.8 mg/dL — AB (ref 0.0–40.0)

## 2016-11-14 LAB — CBC WITH DIFFERENTIAL/PLATELET
BASOS ABS: 0 10*3/uL (ref 0.0–0.1)
Basophils Relative: 0.5 % (ref 0.0–3.0)
EOS ABS: 0.1 10*3/uL (ref 0.0–0.7)
Eosinophils Relative: 1.6 % (ref 0.0–5.0)
HEMATOCRIT: 39.4 % (ref 36.0–46.0)
HEMOGLOBIN: 13.2 g/dL (ref 12.0–15.0)
LYMPHS PCT: 39.1 % (ref 12.0–46.0)
Lymphs Abs: 2.2 10*3/uL (ref 0.7–4.0)
MCHC: 33.5 g/dL (ref 30.0–36.0)
MCV: 86.8 fl (ref 78.0–100.0)
MONOS PCT: 11.7 % (ref 3.0–12.0)
Monocytes Absolute: 0.7 10*3/uL (ref 0.1–1.0)
NEUTROS ABS: 2.6 10*3/uL (ref 1.4–7.7)
Neutrophils Relative %: 47.1 % (ref 43.0–77.0)
PLATELETS: 176 10*3/uL (ref 150.0–400.0)
RBC: 4.54 Mil/uL (ref 3.87–5.11)
RDW: 13.1 % (ref 11.5–15.5)
WBC: 5.6 10*3/uL (ref 4.0–10.5)

## 2016-11-14 LAB — BASIC METABOLIC PANEL
BUN: 19 mg/dL (ref 6–23)
CALCIUM: 9.4 mg/dL (ref 8.4–10.5)
CO2: 29 mEq/L (ref 19–32)
CREATININE: 0.74 mg/dL (ref 0.40–1.20)
Chloride: 106 mEq/L (ref 96–112)
GFR: 83.87 mL/min (ref >60.00)
Glucose, Bld: 95 mg/dL (ref 70–99)
Potassium: 4.1 mEq/L (ref 3.5–5.1)
Sodium: 142 mEq/L (ref 135–145)

## 2016-11-14 LAB — HEPATIC FUNCTION PANEL
ALT: 20 U/L (ref 0–35)
AST: 23 U/L (ref 0–37)
Albumin: 4.1 g/dL (ref 3.5–5.2)
Alkaline Phosphatase: 54 U/L (ref 39–117)
BILIRUBIN DIRECT: 0.1 mg/dL (ref 0.0–0.3)
BILIRUBIN TOTAL: 0.5 mg/dL (ref 0.2–1.2)
Total Protein: 6.4 g/dL (ref 6.0–8.3)

## 2016-11-14 LAB — TSH: TSH: 1.51 u[IU]/mL (ref 0.35–4.50)

## 2016-11-14 LAB — LDL CHOLESTEROL, DIRECT: LDL DIRECT: 123 mg/dL

## 2016-11-15 LAB — HEPATITIS C ANTIBODY: HCV Ab: NEGATIVE

## 2016-11-18 ENCOUNTER — Encounter: Payer: Self-pay | Admitting: Internal Medicine

## 2016-11-18 ENCOUNTER — Ambulatory Visit (INDEPENDENT_AMBULATORY_CARE_PROVIDER_SITE_OTHER): Payer: 59 | Admitting: Internal Medicine

## 2016-11-18 VITALS — BP 122/78 | HR 70 | Ht 62.0 in | Wt 124.0 lb

## 2016-11-18 DIAGNOSIS — Z0000 Encounter for general adult medical examination without abnormal findings: Secondary | ICD-10-CM | POA: Diagnosis not present

## 2016-11-18 DIAGNOSIS — E785 Hyperlipidemia, unspecified: Secondary | ICD-10-CM | POA: Diagnosis not present

## 2016-11-18 DIAGNOSIS — Z114 Encounter for screening for human immunodeficiency virus [HIV]: Secondary | ICD-10-CM

## 2016-11-18 DIAGNOSIS — M79641 Pain in right hand: Secondary | ICD-10-CM | POA: Diagnosis not present

## 2016-11-18 DIAGNOSIS — M79642 Pain in left hand: Secondary | ICD-10-CM | POA: Diagnosis not present

## 2016-11-18 MED ORDER — DICLOFENAC SODIUM 1 % TD GEL: 4.0000 g | Freq: Four times a day (QID) | TRANSDERMAL | 3 refills | Status: DC | PRN

## 2016-11-18 MED FILL — DICLOFENAC SODIUM 1% GEL: 1 | 25 days supply | Qty: 400 | Fill #0

## 2016-11-18 NOTE — Assessment & Plan Note (Signed)
Mild uncontrolled, declines statin, for lower chol diet

## 2016-11-18 NOTE — Patient Instructions (Addendum)
Please take all new medication as prescribed - the Voltaren gel for the hand pain  Please continue all other medications as before, and refills have been done if requested.  Please have the pharmacy call with any other refills you may need.  Please continue your efforts at being more active, low cholesterol diet, and weight control.  You are otherwise up to date with prevention measures today.  Please keep your appointments with your specialists as you may have planned  Please return in 1 year for your yearly visit, or sooner if needed, with Lab testing done 3-5 days before

## 2016-11-18 NOTE — Assessment & Plan Note (Signed)

## 2016-11-18 NOTE — Progress Notes (Signed)
Subjective:    Patient ID: Beverly Shaw, female    DOB: 07-23-1950, 66 y.o.   MRN: [id number]  HPI  Here for wellness and f/u;  Overall doing ok;  Pt denies Chest pain, worsening SOB, DOE, wheezing, orthopnea, PND, worsening LE edema, palpitations, dizziness or syncope.  Pt denies neurological change such as new headache, facial or extremity weakness.  Pt denies polydipsia, polyuria, or low sugar symptoms. Pt states overall good compliance with treatment and medications, good tolerability, and has been trying to follow appropriate diet.  Pt denies worsening depressive symptoms, suicidal ideation or panic. No fever, night sweats, wt loss, loss of appetite, or other constitutional symptoms.  Pt states good ability with ADL's, has low fall risk, home safety reviewed and adequate, no other significant changes in hearing or vision, and active with exercise, still walks every day, manually working re-doing the back yard, after the front yard earlier this yr.  Does have some recurring LBP without change in severity but worse with the yard work, no bowel or bladder change, fever, wt loss,  worsening LE/numbness/weakness but has pain radiating occasionally to both legs, gait change or falls. No plans to retire soon.  S/p urology 2017 evaluation for microhematuria with neg evaluation.   Saw dermatology 2 mo ago, neg exam. Does have hand DJD pain worst to right thumb and index finger, asks for topical tx Past Medical History:  Diagnosis Date  . Atrophic kidney 11/18/2015  . Episodic low back pain   . GERD (gastroesophageal reflux disease)   . Heart murmur   . Migraines   . Osteopenia 10/15/2010  . Osteoporosis, unspecified 10/19/2012  . Positive TB test    Positive TB skin test   Past Surgical History:  Procedure Laterality Date  . ABDOMINAL HYSTERECTOMY  1996  . TUBAL LIGATION      reports that she has quit smoking. She has never used smokeless tobacco. She reports that she drinks alcohol. She reports  that she does not use drugs. family history includes Diabetes in her other; Heart disease in her father and mother. No Known Allergies Current Outpatient Prescriptions on File Prior to Visit  Medication Sig Dispense Refill  . alendronate (FOSAMAX) 70 MG tablet TAKE 1 TABLET BY MOUTH ONCE A WEEK WITH A FULL GLASS OF WATER ON AN EMPTY STOMACH 12 tablet 1  . aspirin 81 MG tablet Take 81 mg by mouth daily.    . Biotin 10 MG CAPS Take 10 mg by mouth.    . calcium carbonate (OS-CAL) 600 MG TABS Take 600 mg by mouth daily.      . Multiple Vitamin (MULTIVITAMIN) capsule Take 1 capsule by mouth daily.       No current facility-administered medications on file prior to visit.    Review of Systems Constitutional: Negative for other unusual diaphoresis, sweats, appetite or weight changes HENT: Negative for other worsening hearing loss, ear pain, facial swelling, mouth sores or neck stiffness.   Eyes: Negative for other worsening pain, redness or other visual disturbance.  Respiratory: Negative for other stridor or swelling Cardiovascular: Negative for other palpitations or other chest pain  Gastrointestinal: Negative for worsening diarrhea or loose stools, blood in stool, distention or other pain Genitourinary: Negative for hematuria, flank pain or other change in urine volume.  Musculoskeletal: Negative for myalgias or other joint swelling.  Skin: Negative for other color change, or other wound or worsening drainage.  Neurological: Negative for other syncope or numbness. Hematological: Negative for  other adenopathy or swelling Psychiatric/Behavioral: Negative for hallucinations, other worsening agitation, SI, self-injury, or new decreased concentration All other system neg per pt    Objective:   Physical Exam BP 122/78   Pulse 70   Ht 5\' 2"  (1.575 m)   Wt 124 lb (56.2 kg)   SpO2 99%   BMI 22.68 kg/m  VS noted,  Constitutional: Pt is oriented to person, place, and time. Appears  well-developed and well-nourished, in no significant distress and comfortable Head: Normocephalic and atraumatic  Eyes: Conjunctivae and EOM are normal. Pupils are equal, round, and reactive to light Right Ear: External ear normal without discharge Left Ear: External ear normal without discharge Nose: Nose without discharge or deformity Mouth/Throat: Oropharynx is without other ulcerations and moist  Neck: Normal range of motion. Neck supple. No JVD present. No tracheal deviation present or significant neck LA or mass Cardiovascular: Normal rate, regular rhythm, normal heart sounds and intact distal pulses.   Pulmonary/Chest: WOB normal and breath sounds without rales or wheezing  Abdominal: Soft. Bowel sounds are normal. NT. No HSM  Musculoskeletal: Normal range of motion. Exhibits no edema; has typical OA changes to both hands, worst to right thumb CMC and index finger PIP and DIP Lymphadenopathy: Has no other cervical adenopathy.  Neurological: Pt is alert and oriented to person, place, and time. Pt has normal reflexes. No cranial nerve deficit. Motor grossly intact, Gait intact Skin: Skin is warm and dry. No rash noted or new ulcerations Psychiatric:  Has normal mood and affect. Behavior is normal without agitation No other exam findings  Lab Results  Component Value Date   WBC 5.6 11/14/2016   HGB 13.2 11/14/2016   HCT 39.4 11/14/2016   PLT 176.0 11/14/2016   GLUCOSE 95 11/14/2016   CHOL 207 (H) 11/14/2016   TRIG 209.0 (H) 11/14/2016   HDL 45.80 11/14/2016   LDLDIRECT 123.0 11/14/2016   LDLCALC 111 (H) 11/07/2014   ALT 20 11/14/2016   AST 23 11/14/2016   NA 142 11/14/2016   K 4.1 11/14/2016   CL 106 11/14/2016   CREATININE 0.74 11/14/2016   BUN 19 11/14/2016   CO2 29 11/14/2016   TSH 1.51 11/14/2016   HGBA1C 5.4 10/16/2013       Assessment & Plan:

## 2016-11-18 NOTE — Assessment & Plan Note (Signed)
C/w OA pain, for volt gel prn,  to f/u any worsening symptoms or concerns

## 2017-06-02 ENCOUNTER — Encounter: Payer: Self-pay | Admitting: Internal Medicine

## 2017-06-02 ENCOUNTER — Ambulatory Visit (INDEPENDENT_AMBULATORY_CARE_PROVIDER_SITE_OTHER): Payer: Medicare HMO | Admitting: Internal Medicine

## 2017-06-02 VITALS — BP 130/82 | HR 72 | Temp 97.8°F | Ht 62.0 in | Wt 129.0 lb

## 2017-06-02 DIAGNOSIS — M5416 Radiculopathy, lumbar region: Secondary | ICD-10-CM | POA: Insufficient documentation

## 2017-06-02 DIAGNOSIS — M940 Chondrocostal junction syndrome [Tietze]: Secondary | ICD-10-CM | POA: Insufficient documentation

## 2017-06-02 DIAGNOSIS — M810 Age-related osteoporosis without current pathological fracture: Secondary | ICD-10-CM

## 2017-06-02 MED ORDER — GABAPENTIN 100 MG PO CAPS: 100.0000 mg | ORAL_CAPSULE | Freq: Three times a day (TID) | ORAL | 3 refills | Status: DC

## 2017-06-02 NOTE — Progress Notes (Signed)
Subjective:    Patient ID: Beverly Shaw, female    DOB: [birth year], 67 y.o.   MRN: [id number]  HPI    Here with c/o 2 areas of pain; the first is 1 month left lower chest/chondral pain where she points, mild, pleuritic, sharp with bending forward or palpation, feels "full" like but no swelling , rash, fever, cough and no recent trauma.  Spends quite a bit of time leaning forward as she really enjoys gardening.  Much worse is acute onset 2 mo low back pain with radiation to the left buttock and distal leg below the knee, assoc with numbness and tingling, also some mild weakness.  She is right handed.  Pt denies bowel or bladder change, fever, wt loss,  Significant gait change or falls.  Worse to bend and stand up, better to lie down at night.  Seemed to start initially after lifted a heavy object that she realizes she should not have done. No prior hx of significant spine dz.   Pt denies other chest pain, increased sob or doe, wheezing, orthopnea, PND, increased LE swelling, palpitations, dizziness or syncope.   Pt denies polydipsia, polyuria Past Medical History:  Diagnosis Date  . Atrophic kidney 11/18/2015  . Episodic low back pain   . GERD (gastroesophageal reflux disease)   . Heart murmur   . Migraines   . Osteopenia 10/15/2010  . Osteoporosis, unspecified 10/19/2012  . Positive TB test    Positive TB skin test   Past Surgical History:  Procedure Laterality Date  . ABDOMINAL HYSTERECTOMY  1996  . TUBAL LIGATION      reports that she has quit smoking. she has never used smokeless tobacco. She reports that she drinks alcohol. She reports that she does not use drugs. family history includes Diabetes in her other; Heart disease in her father and mother. No Known Allergies Current Outpatient Medications on File Prior to Visit  Medication Sig Dispense Refill  . aspirin 81 MG tablet Take 81 mg by mouth daily.    . calcium carbonate (OS-CAL) 600 MG TABS Take 600 mg by mouth daily.      .  Multiple Vitamin (MULTIVITAMIN) capsule Take 1 capsule by mouth daily.       No current facility-administered medications on file prior to visit.    Review of Systems  Constitutional: Negative for other unusual diaphoresis or sweats HENT: Negative for ear discharge or swelling Eyes: Negative for other worsening visual disturbances Respiratory: Negative for stridor or other swelling  Gastrointestinal: Negative for worsening distension or other blood Genitourinary: Negative for retention or other urinary change Musculoskeletal: Negative for other MSK pain or swelling Skin: Negative for color change or other new lesions Neurological: Negative for worsening tremors and other numbness  Psychiatric/Behavioral: Negative for worsening agitation or other fatigue All other system neg per pt    Objective:   Physical Exam BP 130/82   Pulse 72   Temp 97.8 F (36.6 C) (Oral)   Ht 5\' 2"  (1.575 m)   Wt 129 lb (58.5 kg)   SpO2 99%   BMI 23.59 kg/m  VS noted,  Constitutional: Pt appears in NAD HENT: Head: NCAT.  Right Ear: External ear normal.  Left Ear: External ear normal.  Eyes: . Pupils are equal, round, and reactive to light. Conjunctivae and EOM are normal Nose: without d/c or deformity Neck: Neck supple. Gross normal ROM Cardiovascular: Normal rate and regular rhythm.   Pulmonary/Chest: Effort normal and breath sounds without rales  or wheezing.  Abd:  Soft, NT, ND, + BS, no organomegaly Neurological: Pt is alert. At baseline orientation Skin: Skin is warm. No rashes, other new lesions, no LE edema Psychiatric: Pt behavior is normal without agitation  Left costal margin tender to palpate without rash or swelling Lumbar tender in midline, also LLE weakness 4+/5 with some reduced sens to LT Lab Results  Component Value Date   WBC 5.6 11/14/2016   HGB 13.2 11/14/2016   HCT 39.4 11/14/2016   PLT 176.0 11/14/2016   GLUCOSE 95 11/14/2016   CHOL 207 (H) 11/14/2016   TRIG 209.0 (H)  11/14/2016   HDL 45.80 11/14/2016   LDLDIRECT 123.0 11/14/2016   LDLCALC 111 (H) 11/07/2014   ALT 20 11/14/2016   AST 23 11/14/2016   NA 142 11/14/2016   K 4.1 11/14/2016   CL 106 11/14/2016   CREATININE 0.74 11/14/2016   BUN 19 11/14/2016   CO2 29 11/14/2016   TSH 1.51 11/14/2016   HGBA1C 5.4 10/16/2013      Assessment & Plan:

## 2017-06-02 NOTE — Patient Instructions (Signed)
Ok to continue the ibuprofen and tylenol as you have been taking  Please take all new medication as prescribed - the gabapentin 100 mg three times per day; you can stop this if the pain is resolved, or you can call in 1 week for higher dose if needed  Please continue all other medications as before, and refills have been done if requested.  Please have the pharmacy call with any other refills you may need.  Please keep your appointments with your specialists as you may have planned  You will be contacted regarding the referral for: MRI for lower back, and Neurosurgury referral

## 2017-06-04 ENCOUNTER — Encounter: Payer: Self-pay | Admitting: Internal Medicine

## 2017-06-04 NOTE — Assessment & Plan Note (Signed)
Mild to mod, with neuro change, for MRI and NS referral,  to f/u any worsening symptoms or concerns

## 2017-06-04 NOTE — Assessment & Plan Note (Signed)
Also cannot r/o spine fx, for MRI as above

## 2017-06-04 NOTE — Assessment & Plan Note (Signed)
D/w pt, declines rib and cxr for now, for pain control, follow with expectant management

## 2017-06-14 ENCOUNTER — Other Ambulatory Visit: Payer: Medicare HMO

## 2017-06-26 DIAGNOSIS — R030 Elevated blood-pressure reading, without diagnosis of hypertension: Secondary | ICD-10-CM | POA: Diagnosis not present

## 2017-06-26 DIAGNOSIS — M5442 Lumbago with sciatica, left side: Secondary | ICD-10-CM | POA: Diagnosis not present

## 2017-07-11 ENCOUNTER — Ambulatory Visit: Payer: Medicare HMO | Admitting: Internal Medicine

## 2017-07-11 DIAGNOSIS — M5442 Lumbago with sciatica, left side: Secondary | ICD-10-CM | POA: Diagnosis not present

## 2017-07-11 DIAGNOSIS — M545 Low back pain: Secondary | ICD-10-CM | POA: Diagnosis not present

## 2017-07-17 DIAGNOSIS — M4150 Other secondary scoliosis, site unspecified: Secondary | ICD-10-CM | POA: Diagnosis not present

## 2017-07-17 DIAGNOSIS — M4726 Other spondylosis with radiculopathy, lumbar region: Secondary | ICD-10-CM | POA: Diagnosis not present

## 2017-07-24 ENCOUNTER — Encounter: Payer: Self-pay | Admitting: Internal Medicine

## 2017-07-24 ENCOUNTER — Ambulatory Visit (INDEPENDENT_AMBULATORY_CARE_PROVIDER_SITE_OTHER): Payer: Medicare HMO | Admitting: Internal Medicine

## 2017-07-24 VITALS — BP 132/86 | HR 68 | Temp 98.0°F | Ht 62.0 in | Wt 130.0 lb

## 2017-07-24 DIAGNOSIS — R4589 Other symptoms and signs involving emotional state: Secondary | ICD-10-CM | POA: Diagnosis not present

## 2017-07-24 DIAGNOSIS — M5136 Other intervertebral disc degeneration, lumbar region: Secondary | ICD-10-CM | POA: Insufficient documentation

## 2017-07-24 DIAGNOSIS — M810 Age-related osteoporosis without current pathological fracture: Secondary | ICD-10-CM

## 2017-07-24 DIAGNOSIS — R69 Illness, unspecified: Secondary | ICD-10-CM | POA: Diagnosis not present

## 2017-07-24 MED ORDER — TRAMADOL HCL 50 MG PO TABS: 50.0000 mg | ORAL_TABLET | Freq: Four times a day (QID) | ORAL | 1 refills | Status: DC | PRN

## 2017-07-24 NOTE — Progress Notes (Signed)
Subjective:    Patient ID: Beverly Shaw, female    DOB: 10-10-1950, 67 y.o.   MRN: [id number]  HPI  Here to f/u. Pt continues to have recurring left LBP without change in severity but remains mod to severe and left low back neuritic pain, but no bowel or bladder change, fever, wt loss, gait change or falls.  MRI cancelled for jan 23, but did see Dr Cyndy Freeze and was able to get the MRI done, last f/u with feb 25 with Dx degenerative disc with scoliosis; has been referred to pain management, with appt with Dr Orpah Melter for next Monday.  There was some discussion regarding surgury as the disc deterioratin is severe on the left (right is ok);  She is only 73 and can no longer do yoga even, has been active with gardening all her life, and sad today she is having so much difficulty without a chance of complete resolution or prevention of getting worse.  On top of this, chart review indicates abnormal DXA 2015 for which she apparently had no tx, c/w osteoporosis.  No prior hx of fracture, and is willing for tx  No other interval change or new complaints Past Medical History:  Diagnosis Date  . Atrophic kidney 11/18/2015  . Episodic low back pain   . GERD (gastroesophageal reflux disease)   . Heart murmur   . Migraines   . Osteopenia 10/15/2010  . Osteoporosis, unspecified 10/19/2012  . Positive TB test    Positive TB skin test   Past Surgical History:  Procedure Laterality Date  . ABDOMINAL HYSTERECTOMY  1996  . TUBAL LIGATION      reports that she has quit smoking. she has never used smokeless tobacco. She reports that she drinks alcohol. She reports that she does not use drugs. family history includes Diabetes in her other; Heart disease in her father and mother. No Known Allergies Current Outpatient Medications on File Prior to Visit  Medication Sig Dispense Refill  . aspirin 81 MG tablet Take 81 mg by mouth daily.    . calcium carbonate (OS-CAL) 600 MG TABS Take 600 mg by mouth daily.      Marland Kitchen  gabapentin (NEURONTIN) 100 MG capsule Take 1 capsule (100 mg total) by mouth 3 (three) times daily. 90 capsule 3  . Multiple Vitamin (MULTIVITAMIN) capsule Take 1 capsule by mouth daily.       No current facility-administered medications on file prior to visit.    Review of Systems  Constitutional: Negative for other unusual diaphoresis or sweats HENT: Negative for ear discharge or swelling Eyes: Negative for other worsening visual disturbances Respiratory: Negative for stridor or other swelling  Gastrointestinal: Negative for worsening distension or other blood Genitourinary: Negative for retention or other urinary change Musculoskeletal: Negative for other MSK pain or swelling Skin: Negative for color change or other new lesions Neurological: Negative for worsening tremors and other numbness  Psychiatric/Behavioral: Negative for worsening agitation or other fatigue All other system neg per pt    Objective:   Physical Exam BP 132/86   Pulse 68   Temp 98 F (36.7 C) (Oral)   Ht 5\' 2"  (1.575 m)   Wt 130 lb (59 kg)   SpO2 98%   BMI 23.78 kg/m  VS noted, sad demeanor Constitutional: Pt appears in NAD HENT: Head: NCAT.  Right Ear: External ear normal.  Left Ear: External ear normal.  Eyes: . Pupils are equal, round, and reactive to light. Conjunctivae and EOM are  normal Nose: without d/c or deformity Neck: Neck supple. Gross normal ROM Cardiovascular: Normal rate and regular rhythm.   Pulmonary/Chest: Effort normal and breath sounds without rales or wheezing.  Spine: diffuse mild low lumbar tender without swelling or rash Neurological: Pt is alert. At baseline orientation, motor grossly intact Skin: Skin is warm. No rashes, other new lesions, no LE edema Psychiatric: Pt behavior is normal without agitation  No other exam findings     Assessment & Plan:

## 2017-07-24 NOTE — Assessment & Plan Note (Signed)
Per report pt has very severe left sided only lowest lumbar degen disc dz, for tramadol prn, f/u pain management as planned

## 2017-07-24 NOTE — Patient Instructions (Signed)
Please take all new medication as prescribed - the tramadol as needed for pain  You should be contacted by Jonelle Sidle from the office about the Prolia  Please schedule the bone density test before leaving today at the scheduling desk (where you check out)  Please continue all other medications as before, and refills have been done if requested.  Please have the pharmacy call with any other refills you may need.  Please keep your appointments with your specialists as you may have planned

## 2017-07-24 NOTE — Assessment & Plan Note (Signed)
D/w pt, for contd calcium/vit d, also start prolia and will need updated DXA

## 2017-07-24 NOTE — Assessment & Plan Note (Signed)
D/w pt, declines need for referral for counseling or med tx

## 2017-07-28 ENCOUNTER — Ambulatory Visit (INDEPENDENT_AMBULATORY_CARE_PROVIDER_SITE_OTHER)
Admission: RE | Admit: 2017-07-28 | Discharge: 2017-07-28 | Disposition: A | Discharge status: Home / Self Care | Payer: Medicare HMO | Source: Ambulatory Visit | Attending: Internal Medicine | Admitting: Internal Medicine

## 2017-07-28 DIAGNOSIS — M810 Age-related osteoporosis without current pathological fracture: Secondary | ICD-10-CM | POA: Diagnosis not present

## 2017-07-31 ENCOUNTER — Other Ambulatory Visit: Payer: Self-pay | Admitting: Internal Medicine

## 2017-07-31 ENCOUNTER — Telehealth: Payer: Self-pay

## 2017-07-31 DIAGNOSIS — M9983 Other biomechanical lesions of lumbar region: Secondary | ICD-10-CM | POA: Diagnosis not present

## 2017-07-31 DIAGNOSIS — M5416 Radiculopathy, lumbar region: Secondary | ICD-10-CM | POA: Diagnosis not present

## 2017-07-31 DIAGNOSIS — M5126 Other intervertebral disc displacement, lumbar region: Secondary | ICD-10-CM | POA: Diagnosis not present

## 2017-07-31 MED ORDER — ALENDRONATE SODIUM 70 MG PO TABS: 70.0000 mg | ORAL_TABLET | ORAL | 3 refills | Status: DC

## 2017-07-31 NOTE — Telephone Encounter (Signed)
-----   Message from Biagio Borg, MD sent at 07/31/2017 12:20 PM EDT ----- Left message on MyChart, pt to cont same tx except  The test results show that your current treatment is OK, except the bone density test is consistent with bone loss call Osteopenia that is quite close to osteoporosis.   We should start fosamax 70 mg weekly to help slow the process from getting worse.  I will send a new prescription and you should hear from the office as well.    Beverly Shaw to please inform pt, I will do rx

## 2017-07-31 NOTE — Telephone Encounter (Signed)
Pt has viewed results via MyChart  

## 2017-08-15 DIAGNOSIS — M5126 Other intervertebral disc displacement, lumbar region: Secondary | ICD-10-CM | POA: Diagnosis not present

## 2017-09-08 DIAGNOSIS — Z90710 Acquired absence of both cervix and uterus: Secondary | ICD-10-CM | POA: Diagnosis not present

## 2017-09-08 DIAGNOSIS — Z01419 Encounter for gynecological examination (general) (routine) without abnormal findings: Secondary | ICD-10-CM | POA: Diagnosis not present

## 2017-09-08 DIAGNOSIS — Z1272 Encounter for screening for malignant neoplasm of vagina: Secondary | ICD-10-CM | POA: Diagnosis not present

## 2017-09-08 DIAGNOSIS — Z1239 Encounter for other screening for malignant neoplasm of breast: Secondary | ICD-10-CM | POA: Diagnosis not present

## 2017-09-19 ENCOUNTER — Other Ambulatory Visit: Payer: Self-pay | Admitting: Obstetrics and Gynecology

## 2017-09-19 DIAGNOSIS — Z1231 Encounter for screening mammogram for malignant neoplasm of breast: Secondary | ICD-10-CM

## 2017-10-03 ENCOUNTER — Ambulatory Visit
Admission: RE | Admit: 2017-10-03 | Discharge: 2017-10-03 | Disposition: A | Discharge status: Home / Self Care | Payer: Medicare HMO | Source: Ambulatory Visit | Attending: Obstetrics and Gynecology | Admitting: Obstetrics and Gynecology

## 2017-10-03 DIAGNOSIS — Z1231 Encounter for screening mammogram for malignant neoplasm of breast: Secondary | ICD-10-CM | POA: Diagnosis not present

## 2017-10-04 DIAGNOSIS — D2271 Melanocytic nevi of right lower limb, including hip: Secondary | ICD-10-CM | POA: Diagnosis not present

## 2017-10-04 DIAGNOSIS — D2262 Melanocytic nevi of left upper limb, including shoulder: Secondary | ICD-10-CM | POA: Diagnosis not present

## 2017-10-04 DIAGNOSIS — L821 Other seborrheic keratosis: Secondary | ICD-10-CM | POA: Diagnosis not present

## 2017-10-04 DIAGNOSIS — L7211 Pilar cyst: Secondary | ICD-10-CM | POA: Diagnosis not present

## 2017-10-04 DIAGNOSIS — D225 Melanocytic nevi of trunk: Secondary | ICD-10-CM | POA: Diagnosis not present

## 2017-10-04 DIAGNOSIS — D1800 Hemangioma unspecified site: Secondary | ICD-10-CM | POA: Diagnosis not present

## 2017-10-04 DIAGNOSIS — D485 Neoplasm of uncertain behavior of skin: Secondary | ICD-10-CM | POA: Diagnosis not present

## 2017-10-04 DIAGNOSIS — L814 Other melanin hyperpigmentation: Secondary | ICD-10-CM | POA: Diagnosis not present

## 2017-10-11 DIAGNOSIS — M5126 Other intervertebral disc displacement, lumbar region: Secondary | ICD-10-CM | POA: Diagnosis not present

## 2017-10-11 DIAGNOSIS — M5416 Radiculopathy, lumbar region: Secondary | ICD-10-CM | POA: Diagnosis not present

## 2017-10-23 DIAGNOSIS — R69 Illness, unspecified: Secondary | ICD-10-CM | POA: Diagnosis not present

## 2017-11-15 ENCOUNTER — Other Ambulatory Visit (INDEPENDENT_AMBULATORY_CARE_PROVIDER_SITE_OTHER): Payer: Medicare HMO

## 2017-11-15 DIAGNOSIS — Z0000 Encounter for general adult medical examination without abnormal findings: Secondary | ICD-10-CM | POA: Diagnosis not present

## 2017-11-15 DIAGNOSIS — R69 Illness, unspecified: Secondary | ICD-10-CM | POA: Diagnosis not present

## 2017-11-15 DIAGNOSIS — Z114 Encounter for screening for human immunodeficiency virus [HIV]: Secondary | ICD-10-CM

## 2017-11-15 LAB — CBC WITH DIFFERENTIAL/PLATELET
BASOS PCT: 0.6 % (ref 0.0–3.0)
Basophils Absolute: 0 10*3/uL (ref 0.0–0.1)
EOS ABS: 0.1 10*3/uL (ref 0.0–0.7)
EOS PCT: 1.6 % (ref 0.0–5.0)
HCT: 38.9 % (ref 36.0–46.0)
HEMOGLOBIN: 13.3 g/dL (ref 12.0–15.0)
LYMPHS ABS: 2.2 10*3/uL (ref 0.7–4.0)
Lymphocytes Relative: 39.5 % (ref 12.0–46.0)
MCHC: 34.3 g/dL (ref 30.0–36.0)
MCV: 87.7 fl (ref 78.0–100.0)
MONO ABS: 0.7 10*3/uL (ref 0.1–1.0)
Monocytes Relative: 12.2 % — ABNORMAL HIGH (ref 3.0–12.0)
NEUTROS ABS: 2.5 10*3/uL (ref 1.4–7.7)
NEUTROS PCT: 46.1 % (ref 43.0–77.0)
PLATELETS: 188 10*3/uL (ref 150.0–400.0)
RBC: 4.43 Mil/uL (ref 3.87–5.11)
RDW: 13.5 % (ref 11.5–15.5)
WBC: 5.5 10*3/uL (ref 4.0–10.5)

## 2017-11-15 LAB — URINALYSIS, ROUTINE W REFLEX MICROSCOPIC
BILIRUBIN URINE: NEGATIVE
KETONES UR: NEGATIVE
Leukocytes, UA: NEGATIVE
NITRITE: NEGATIVE
PH: 6.5 (ref 5.0–8.0)
Specific Gravity, Urine: 1.01 (ref 1.000–1.030)
Total Protein, Urine: NEGATIVE
URINE GLUCOSE: NEGATIVE
UROBILINOGEN UA: 0.2 (ref 0.0–1.0)

## 2017-11-15 LAB — LIPID PANEL
CHOLESTEROL: 212 mg/dL — AB (ref 0–200)
HDL: 59.7 mg/dL (ref >39.00)
LDL Cholesterol: 132 mg/dL — ABNORMAL HIGH (ref 0–99)
NONHDL: 151.9
Total CHOL/HDL Ratio: 4
Triglycerides: 98 mg/dL (ref 0.0–149.0)
VLDL: 19.6 mg/dL (ref 0.0–40.0)

## 2017-11-15 LAB — HEPATIC FUNCTION PANEL
ALK PHOS: 45 U/L (ref 39–117)
ALT: 24 U/L (ref 0–35)
AST: 25 U/L (ref 0–37)
Albumin: 4.1 g/dL (ref 3.5–5.2)
BILIRUBIN DIRECT: 0.1 mg/dL (ref 0.0–0.3)
Total Bilirubin: 0.5 mg/dL (ref 0.2–1.2)
Total Protein: 6.6 g/dL (ref 6.0–8.3)

## 2017-11-15 LAB — BASIC METABOLIC PANEL
BUN: 18 mg/dL (ref 6–23)
CALCIUM: 9.2 mg/dL (ref 8.4–10.5)
CO2: 28 mEq/L (ref 19–32)
CREATININE: 0.75 mg/dL (ref 0.40–1.20)
Chloride: 107 mEq/L (ref 96–112)
GFR: 82.32 mL/min (ref >60.00)
Glucose, Bld: 96 mg/dL (ref 70–99)
POTASSIUM: 4.1 meq/L (ref 3.5–5.1)
Sodium: 142 mEq/L (ref 135–145)

## 2017-11-15 LAB — TSH: TSH: 1.6 u[IU]/mL (ref 0.35–4.50)

## 2017-11-16 LAB — HIV ANTIBODY (ROUTINE TESTING W REFLEX): HIV: NONREACTIVE

## 2017-11-21 ENCOUNTER — Encounter: Payer: Self-pay | Admitting: Internal Medicine

## 2017-11-21 ENCOUNTER — Ambulatory Visit (INDEPENDENT_AMBULATORY_CARE_PROVIDER_SITE_OTHER): Payer: Medicare HMO | Admitting: Internal Medicine

## 2017-11-21 VITALS — BP 118/78 | HR 77 | Temp 97.9°F | Ht 62.0 in | Wt 128.0 lb

## 2017-11-21 DIAGNOSIS — Z0000 Encounter for general adult medical examination without abnormal findings: Secondary | ICD-10-CM

## 2017-11-21 DIAGNOSIS — E785 Hyperlipidemia, unspecified: Secondary | ICD-10-CM

## 2017-11-21 DIAGNOSIS — Z23 Encounter for immunization: Secondary | ICD-10-CM

## 2017-11-21 MED ORDER — ZOSTER VAC RECOMB ADJUVANTED 50 MCG/0.5ML IM SUSR: 0.5000 mL | Freq: Once | INTRAMUSCULAR | 1 refills | Status: AC

## 2017-11-21 NOTE — Assessment & Plan Note (Signed)

## 2017-11-21 NOTE — Progress Notes (Signed)
Subjective:    Patient ID: Beverly Shaw, female    DOB: May 08, 1951, 67 y.o.   MRN: [id number]  HPI  Here for wellness and f/u;  Overall doing ok;  Pt denies Chest pain, worsening SOB, DOE, wheezing, orthopnea, PND, worsening LE edema, palpitations, dizziness or syncope.  Pt denies neurological change such as new headache, facial or extremity weakness.  Pt denies polydipsia, polyuria, or low sugar symptoms. Pt states overall good compliance with treatment and medications, good tolerability, and has been trying to follow appropriate diet.  Pt denies worsening depressive symptoms, suicidal ideation or panic. No fever, night sweats, wt loss, loss of appetite, or other constitutional symptoms.  Pt states good ability with ADL's, has low fall risk, home safety reviewed and adequate, no other significant changes in hearing or vision, and is active with exercise with growing her own foods, trying to get the husband to eat more mediterraneium diet like her. Pt continues to have recurring LBP without change in severity, bowel or bladder change, fever, wt loss,  worsening LE pain/numbness/weakness, gait change or falls. Past Medical History:  Diagnosis Date  . Atrophic kidney 11/18/2015  . Episodic low back pain   . GERD (gastroesophageal reflux disease)   . Heart murmur   . Migraines   . Osteopenia 10/15/2010  . Osteoporosis, unspecified 10/19/2012  . Positive TB test    Positive TB skin test   Past Surgical History:  Procedure Laterality Date  . ABDOMINAL HYSTERECTOMY  1996  . TUBAL LIGATION      reports that she has quit smoking. She has never used smokeless tobacco. She reports that she drinks alcohol. She reports that she does not use drugs. family history includes Diabetes in her other; Heart disease in her father and mother. No Known Allergies Current Outpatient Medications on File Prior to Visit  Medication Sig Dispense Refill  . alendronate (FOSAMAX) 70 MG tablet Take 1 tablet (70 mg total)  by mouth every 7 (seven) days. Take with a full glass of water on an empty stomach. 12 tablet 3  . calcium carbonate (OS-CAL) 600 MG TABS Take 600 mg by mouth daily.      Marland Kitchen gabapentin (NEURONTIN) 100 MG capsule Take 1 capsule (100 mg total) by mouth 3 (three) times daily. 90 capsule 3  . Multiple Vitamin (MULTIVITAMIN) capsule Take 1 capsule by mouth daily.      . traMADol (ULTRAM) 50 MG tablet Take 1 tablet (50 mg total) by mouth every 6 (six) hours as needed. 60 tablet 1   No current facility-administered medications on file prior to visit.    Review of Systems Constitutional: Negative for other unusual diaphoresis, sweats, appetite or weight changes HENT: Negative for other worsening hearing loss, ear pain, facial swelling, mouth sores or neck stiffness.   Eyes: Negative for other worsening pain, redness or other visual disturbance.  Respiratory: Negative for other stridor or swelling Cardiovascular: Negative for other palpitations or other chest pain  Gastrointestinal: Negative for worsening diarrhea or loose stools, blood in stool, distention or other pain Genitourinary: Negative for hematuria, flank pain or other change in urine volume.  Musculoskeletal: Negative for myalgias or other joint swelling.  Skin: Negative for other color change, or other wound or worsening drainage.  Neurological: Negative for other syncope or numbness. Hematological: Negative for other adenopathy or swelling Psychiatric/Behavioral: Negative for hallucinations, other worsening agitation, SI, self-injury, or new decreased concentration All other system neg per pt    Objective:  Physical Exam BP 118/78   Pulse 77   Temp 97.9 F (36.6 C) (Oral)   Ht 5\' 2"  (1.575 m)   Wt 128 lb (58.1 kg)   SpO2 97%   BMI 23.41 kg/m  VS noted,  Constitutional: Pt is oriented to person, place, and time. Appears well-developed and well-nourished, in no significant distress and comfortable Head: Normocephalic and  atraumatic  Eyes: Conjunctivae and EOM are normal. Pupils are equal, round, and reactive to light Right Ear: External ear normal without discharge Left Ear: External ear normal without discharge Nose: Nose without discharge or deformity Mouth/Throat: Oropharynx is without other ulcerations and moist  Neck: Normal range of motion. Neck supple. No JVD present. No tracheal deviation present or significant neck LA or mass Cardiovascular: Normal rate, regular rhythm, normal heart sounds and intact distal pulses. Pulmonary/Chest: WOB normal and breath sounds without rales or wheezing  Abdominal: Soft. Bowel sounds are normal. NT. No HSM  Musculoskeletal: Normal range of motion. Exhibits no edema Lymphadenopathy: Has no other cervical adenopathy.  Neurological: Pt is alert and oriented to person, place, and time. Pt has normal reflexes. No cranial nerve deficit. Motor grossly intact, Gait intact Skin: Skin is warm and dry. No rash noted or new ulcerations Psychiatric:  Has normal mood and affect. Behavior is normal without agitation No other exam findings Lab Results  Component Value Date   WBC 5.5 11/15/2017   HGB 13.3 11/15/2017   HCT 38.9 11/15/2017   PLT 188.0 11/15/2017   GLUCOSE 96 11/15/2017   CHOL 212 (H) 11/15/2017   TRIG 98.0 11/15/2017   HDL 59.70 11/15/2017   LDLDIRECT 123.0 11/14/2016   LDLCALC 132 (H) 11/15/2017   ALT 24 11/15/2017   AST 25 11/15/2017   NA 142 11/15/2017   K 4.1 11/15/2017   CL 107 11/15/2017   CREATININE 0.75 11/15/2017   BUN 18 11/15/2017   CO2 28 11/15/2017   TSH 1.60 11/15/2017   HGBA1C 5.4 10/16/2013       Assessment & Plan:

## 2017-11-21 NOTE — Assessment & Plan Note (Signed)
Declines statin for goal ldl < 100, cont diet

## 2017-11-21 NOTE — Patient Instructions (Addendum)
You had the Prevnar 13 pneumonia shot  Your shingles shot prescription was sent to the [facility]  Please continue all other medications as before, and refills have been done if requested.  Please have the pharmacy call with any other refills you may need.  Please continue your efforts at being more active, low cholesterol diet, and weight control.  You are otherwise up to date with prevention measures today.  Please keep your appointments with your specialists as you may have planned  Please return in 1 year for your yearly visit, or sooner if needed, with Lab testing done 3-5 days before

## 2017-11-22 DIAGNOSIS — R030 Elevated blood-pressure reading, without diagnosis of hypertension: Secondary | ICD-10-CM | POA: Diagnosis not present

## 2017-11-22 DIAGNOSIS — M5416 Radiculopathy, lumbar region: Secondary | ICD-10-CM | POA: Diagnosis not present

## 2017-11-22 DIAGNOSIS — Z6826 Body mass index (BMI) 26.0-26.9, adult: Secondary | ICD-10-CM | POA: Diagnosis not present

## 2017-11-22 DIAGNOSIS — M5126 Other intervertebral disc displacement, lumbar region: Secondary | ICD-10-CM | POA: Diagnosis not present

## 2018-01-03 MED FILL — AMOX-CLAV 875-125 MG TABLET: 875-125 | 7 days supply | Qty: 14 | Fill #0

## 2018-05-01 DIAGNOSIS — R69 Illness, unspecified: Secondary | ICD-10-CM | POA: Diagnosis not present

## 2018-05-25 DIAGNOSIS — M5126 Other intervertebral disc displacement, lumbar region: Secondary | ICD-10-CM | POA: Diagnosis not present

## 2018-05-25 DIAGNOSIS — M5416 Radiculopathy, lumbar region: Secondary | ICD-10-CM | POA: Diagnosis not present

## 2018-06-01 ENCOUNTER — Other Ambulatory Visit: Payer: Self-pay

## 2018-06-01 ENCOUNTER — Ambulatory Visit: Payer: Medicare HMO | Attending: Physical Medicine and Rehabilitation

## 2018-06-01 DIAGNOSIS — M5440 Lumbago with sciatica, unspecified side: Secondary | ICD-10-CM | POA: Insufficient documentation

## 2018-06-01 DIAGNOSIS — R293 Abnormal posture: Secondary | ICD-10-CM | POA: Diagnosis not present

## 2018-06-01 DIAGNOSIS — G8929 Other chronic pain: Secondary | ICD-10-CM | POA: Diagnosis not present

## 2018-06-01 NOTE — Therapy (Signed)
[place], [place], [address] Phone: [phone]   Fax:  [phone]  Physical Therapy Evaluation  Patient Details  Name: Beverly Shaw MRN: [id number] Date of Birth: 1951-01-03 Referring Provider (PT): Marlaine Hind , MD   Encounter Date: 06/01/2018  PT End of Session - 06/01/18 0918    Visit Number  1    Number of Visits  10    Date for PT Re-Evaluation  07/06/18    Authorization Type  [employer]    Authorization Time Period  progress note at visit 10 ,  KX at visit 15    PT Start Time  0918    PT Stop Time  1018    PT Time Calculation (min)  60 min    Activity Tolerance  Patient tolerated treatment well    Behavior During Therapy  [facility] for tasks assessed/performed       Past Medical History:  Diagnosis Date  . Atrophic kidney 11/18/2015  . Episodic low back pain   . GERD (gastroesophageal reflux disease)   . Heart murmur   . Migraines   . Osteopenia 10/15/2010  . Osteoporosis, unspecified 10/19/2012  . Positive TB test    Positive TB skin test    Past Surgical History:  Procedure Laterality Date  . ABDOMINAL HYSTERECTOMY  1996  . TUBAL LIGATION      There were no vitals filed for this visit.   Subjective Assessment - 06/01/18 0925    Subjective  She reports  her lower back hurts to hips  to ankles.  At times toes feel numb. more on LT. Pain over years  5 years or more  and worse in past year.  She requested PT.    Neuro surgeon disscussed  possible surgery. Exercises with walking.  .     Patient is accompained by:  Family member    Pertinent History  2 injectiosn  in past month, osteoporosis.       Limitations  Sitting;Lifting;Standing;House hold activities    How long can you sit comfortably?  30-45 min    How long can you stand comfortably?  60 min    How long can you walk comfortably?  30 min or more.     Diagnostic tests  MRI DDD, Scoliosis    Patient Stated Goals  Eliminate  pain.     Currently in Pain?  Yes    Pain Score  6     Pain Location  Back    Pain Orientation  Lower;Right;Left    Pain Descriptors / Indicators  Aching    Pain Type  Chronic pain    Pain Radiating Towards  both posterior legs and LT more than RT    Pain Onset  More than a month ago    Pain Frequency  Constant    Aggravating Factors   lifting , sitting.     Pain Relieving Factors  meds         [facility] PT Assessment - 06/01/18 0001      Assessment   Medical Diagnosis  HNP, DDD    Referring Provider (PT)  Marlaine Hind , MD    Onset Date/Surgical Date  --   5years or more   Next MD Visit  As needed    Prior Therapy  No      Precautions   Precautions  None      Restrictions   Weight Bearing Restrictions  No  Balance Screen   Has the patient fallen in the past 6 months  No      Prior Function   Level of Independence  Independent    Vocation  Full time employment    Vocation Requirements  No limits at work. sits all day. pain/ stiffness increses      Cognition   Overall Cognitive Status  Within Functional Limits for tasks assessed      Observation/Other Assessments   Focus on Therapeutic Outcomes (FOTO)   45% limited      Posture/Postural Control   Posture Comments  Slight RT ilia higher than LT   , sway back.        ROM / Strength   AROM / PROM / Strength  AROM;Strength      AROM   AROM Assessment Site  Lumbar    Lumbar Flexion  60    Lumbar Extension  20   more pain   Lumbar - Right Side Bend  15    Lumbar - Left Side Bend  10    Lumbar - Right Rotation  70    Lumbar - Left Rotation  60      Strength   Overall Strength Comments  Bilateral LE WNL      Flexibility   Soft Tissue Assessment /Muscle Length  yes    Hamstrings  SLR RT 70 degrees  LT 80      Palpation   SI assessment   Lt ilia elevate and in flare.      Palpation comment  Lt leg short in supine.                   Objective measurements completed on examination: See above  findings.      [facility] Adult PT Treatment/Exercise - 06/01/18 0001      Exercises   Exercises  Lumbar      Lumbar Exercises: Stretches   Single Knee to Chest Stretch  Right;Left;2 reps;30 seconds    Lower Trunk Rotation  2 reps;20 seconds    Other Lumbar Stretch Exercise  childs pose  1 x 20  and 10 sec RT and LT              PT Education - 06/01/18 1033    Education Details  POC  HEP    Person(s) Educated  Patient;Spouse    Methods  Explanation;Demonstration;Tactile cues;Verbal cues;Handout    Comprehension  Verbalized understanding;Returned demonstration       PT Short Term Goals - 06/01/18 1038      PT SHORT TERM GOAL #1   Title  She will be independent with initial HEp     Time  2    Period  Weeks    Status  New      PT SHORT TERM GOAL #2   Title  She will report pain decrease 20% or more with home tasks    Time  2    Period  Weeks    Status  New      PT SHORT TERM GOAL #3   Title  She will report being more aware of body mechanics with decr bend and twisting    Time  3    Period  Weeks    Status  New        PT Long Term Goals - 06/01/18 1039      PT LONG TERM GOAL #1   Title  She will be independent with all HEP issued  Time  5    Period  Weeks    Status  New      PT LONG TERM GOAL #2   Title  She will report pain decreased 30-40% overall     Time  5    Period  Weeks    Status  New      PT LONG TERM GOAL #3   Title  She will report pain decreased with sitting and able to sit for 60 min without incr pain    Time  5    Period  Weeks    Status  New      PT LONG TERM GOAL #4   Title      FOTO score will decr to 34% limited from 45% limited    Time  5    Period  Weeks    Status  New             Plan - 06/01/18 1033    Clinical Impression Statement  [name] presents with chronic LBP and symproms into LT>RT legs.   She has scoliosis  and reports osetoporosis. She would benefit from some core strengthening program , reports  osteoprosis.  Her LE strength is normal,    She would benefit from skilled PT to progress strength/stabilization exercises and stretching program to decr pain.     Clinical Presentation  Stable    Clinical Decision Making  Low    Rehab Potential  Good    PT Frequency  2x / week    PT Duration  6 weeks    PT Treatment/Interventions  Passive range of motion;Manual techniques;Patient/family education;Therapeutic exercise;Therapeutic activities;Moist Heat;Electrical Stimulation;Traction;Dry needling    PT Next Visit Plan  REview stretcing and progress core strength. Modalites and manual as needed    PT Home Exercise Plan  LTR , SKC, childs pose , side bend trunk stretch    Consulted and Agree with Plan of Care  Patient       Patient will benefit from skilled therapeutic intervention in order to improve the following deficits and impairments:  Pain, Postural dysfunction, Decreased activity tolerance, Decreased range of motion, Increased muscle spasms  Visit Diagnosis: Chronic bilateral low back pain with sciatica, sciatica laterality unspecified  Abnormal posture     Problem List Patient Active Problem List   Diagnosis Date Noted  . Lumbar degenerative disc disease 07/24/2017  . Sadness 07/24/2017  . Left lumbar radiculopathy 06/02/2017  . Costochondritis, acute 06/02/2017  . Atrophic kidney 11/18/2015  . Bilateral hand pain 11/18/2015  . Chronic lower back pain 11/18/2015  . Bilateral hearing loss 11/18/2015  . Recurrent boils 09/09/2015  . Dysuria 03/25/2015  . Microhematuria 11/14/2014  . Toe pain, right 07/08/2014  . Bunion of great toe of right foot 07/08/2014  . Other screening mammogram 10/22/2013  . Dog bite of finger 08/01/2013  . Swelling of third finger of right hand 01/07/2013  . Cervical radiculitis 11/29/2012  . Allergic rhinitis, cause unspecified 11/29/2012  . Preventative health care 10/19/2012  . Impaired glucose tolerance 10/19/2012  . Alopecia areata  10/19/2012  . Osteoporosis 10/19/2012  . Hyperlipidemia 10/23/2011  . Bloating symptom 10/19/2011  . Onychomycosis 10/19/2011  . Urinary frequency 10/19/2011  . Positive TB test   . Episodic low back pain   . GERD (gastroesophageal reflux disease)   . Cluster headache, episodic     Darrel Hoover  PT 06/01/2018, 10:45 AM  [place]  [place], [place], [address] Phone: [phone]   Fax:  [phone]  Name: Beverly Shaw MRN: [id number] Date of Birth: 03-06-1951

## 2018-06-01 NOTE — Patient Instructions (Signed)
Single knee to chest,  LTR, childs pose,  Sit trunk side bend RT/ LT    2x/day 2-3 reps 10-30 sec

## 2018-06-08 ENCOUNTER — Encounter

## 2018-06-11 ENCOUNTER — Ambulatory Visit: Payer: Medicare HMO

## 2018-06-12 ENCOUNTER — Ambulatory Visit: Payer: Medicare HMO | Admitting: Physical Therapy

## 2018-06-12 ENCOUNTER — Encounter: Payer: Self-pay | Admitting: Physical Therapy

## 2018-06-12 DIAGNOSIS — G8929 Other chronic pain: Secondary | ICD-10-CM

## 2018-06-12 DIAGNOSIS — M5440 Lumbago with sciatica, unspecified side: Secondary | ICD-10-CM | POA: Diagnosis not present

## 2018-06-12 DIAGNOSIS — R293 Abnormal posture: Secondary | ICD-10-CM | POA: Diagnosis not present

## 2018-06-12 NOTE — Patient Instructions (Signed)
Sleeping on Back  Place pillow under knees. A pillow with cervical support and a roll around waist are also helpful. Copyright  VHI. All rights reserved.  Sleeping on Side Place pillow between knees. Use cervical support under neck and a roll around waist as needed. Copyright  VHI. All rights reserved.   Sleeping on Stomach   If this is the only desirable sleeping position, place pillow under lower legs, and under stomach or chest as needed.  Posture - Sitting   Sit upright, head facing forward. Try using a roll to support lower back. Keep shoulders relaxed, and avoid rounded back. Keep hips level with knees. Avoid crossing legs for long periods. Stand to Sit / Sit to Stand   To sit: Bend knees to lower self onto front edge of chair, then scoot back on seat. To stand: Reverse sequence by placing one foot forward, and scoot to front of seat. Use rocking motion to stand up.   Work Height and Reach  Ideal work height is no more than 2 to 4 inches below elbow level when standing, and at elbow level when sitting. Reaching should be limited to arm's length, with elbows slightly bent.  Bending  Bend at hips and knees, not back. Keep feet shoulder-width apart.    Posture - Standing   Good posture is important. Avoid slouching and forward head thrust. Maintain curve in low back and align ears over shoul- ders, hips over ankles.  Alternating Positions   Alternate tasks and change positions frequently to reduce fatigue and muscle tension. Take rest breaks. Computer Work   Position work to face forward. Use proper work and seat height. Keep shoulders back and down, wrists straight, and elbows at right angles. Use chair that provides full back support. Add footrest and lumbar roll as needed.  Getting Into / Out of Car  Lower self onto seat, scoot back, then bring in one leg at a time. Reverse sequence to get out.  Dressing  Lie on back to pull socks or slacks over feet, or sit  and bend leg while keeping back straight.    Housework - Sink  Place one foot on ledge of cabinet under sink when standing at sink for prolonged periods.   Pushing / Pulling  Pushing is preferable to pulling. Keep back in proper alignment, and use leg muscles to do the work.  Deep Squat   Squat and lift with both arms held against upper trunk. Tighten stomach muscles without holding breath. Use smooth movements to avoid jerking.  Avoid Twisting   Avoid twisting or bending back. Pivot around using foot movements, and bend at knees if needed when reaching for articles.  Carrying Luggage   Distribute weight evenly on both sides. Use a cart whenever possible. Do not twist trunk. Move body as a unit.   Lifting Principles .Maintain proper posture and head alignment. .Slide object as close as possible before lifting. .Move obstacles out of the way. .Test before lifting; ask for help if too heavy. .Tighten stomach muscles without holding breath. .Use smooth movements; do not jerk. .Use legs to do the work, and pivot with feet. .Distribute the work load symmetrically and close to the center of trunk. .Push instead of pull whenever possible.   Ask For Help   Ask for help and delegate to others when possible. Coordinate your movements when lifting together, and maintain the low back curve.  Log Roll   Lying on back, bend left knee and place left   arm across chest. Roll all in one movement to the right. Reverse to roll to the left. Always move as one unit. Housework - Sweeping  Use long-handled equipment to avoid stooping.   Housework - Wiping  Position yourself as close as possible to reach work surface. Avoid straining your back.  Laundry - Unloading Wash   To unload small items at bottom of washer, lift leg opposite to arm being used to reach.  [place] close to area to be raked. Use arm movements to do the work. Keep back straight and avoid  twisting.     Cart  When reaching into cart with one arm, lift opposite leg to keep back straight.   Getting Into / Out of Bed  Lower self to lie down on one side by raising legs and lowering head at the same time. Use arms to assist moving without twisting. Bend both knees to roll onto back if desired. To sit up, start from lying on side, and use same move-ments in reverse. Housework - Vacuuming  Hold the vacuum with arm held at side. Step back and forth to move it, keeping head up. Avoid twisting.   Laundry - IT consultant so that bending and twisting can be avoided.   Laundry - Unloading Dryer  Squat down to reach into clothes dryer or use a reacher.  Gardening - Weeding / Probation officer or Kneel. Knee pads may be helpful.                    Decompression Exercisd handout and demonstrated in clinic  Pelvic Tilt    Flatten back by tightening stomach muscles and buttocks. Repeat ____ times per set. Do ____ sets per session. Do ____ sessions per day.  http://orth.exer.us/135   Copyright  VHI. All rights reserved.      Isometric Hold With Pelvic Floor (Hook-Lying)  Lie with hips and knees bent. Slowly inhale, and then exhale. Pull navel toward spine and tighten pelvic floor. Hold for __10_ seconds. Continue to breathe in and out during hold. Rest for _10__ seconds. Repeat __10_ times. Do __2-3_ times a day.       Beverly Shaw, PT Certified Exercise Expert for the Aging Adult  06/12/18 4:14 PM Phone: [phone] Fax: [phone]

## 2018-06-12 NOTE — Therapy (Signed)
[place], [place], [address] Phone: [phone]   Fax:  [phone]  Physical Therapy Treatment  Patient Details  Name: Beverly Shaw MRN: [id number] Date of Birth: 11/25/1950 Referring Provider (PT): Marlaine Hind , MD   Encounter Date: 06/12/2018  PT End of Session - 06/12/18 1552    Visit Number  2    Number of Visits  10    Date for PT Re-Evaluation  07/06/18    Authorization Type  [employer]    Authorization Time Period  progress note at visit 10 ,  KX at visit 15    PT Start Time  1550    PT Stop Time  1630    PT Time Calculation (min)  40 min    Activity Tolerance  Patient tolerated treatment well       Past Medical History:  Diagnosis Date  . Atrophic kidney 11/18/2015  . Episodic low back pain   . GERD (gastroesophageal reflux disease)   . Heart murmur   . Migraines   . Osteopenia 10/15/2010  . Osteoporosis, unspecified 10/19/2012  . Positive TB test    Positive TB skin test    Past Surgical History:  Procedure Laterality Date  . ABDOMINAL HYSTERECTOMY  1996  . TUBAL LIGATION      There were no vitals filed for this visit.  Subjective Assessment - 06/12/18 1556    Subjective  I have spasms, tingling into my toes on my left, mostly left but sometimes onmy right. Since I have been getting injections, I feel like I get more spasms in my leg. On Jan 3 I got two injections in my low back and I dont think it really helped much.    Pertinent History  2 injectiosn  in past month, osteoporosis.       Limitations  Sitting;Lifting;Standing;House hold activities    Patient Stated Goals  Eliminate pain.  Pt would like to garden     Currently in Pain?  Yes    Pain Score  6     Pain Location  Back    Pain Orientation  Right;Left;Lower    Pain Descriptors / Indicators  Aching    Pain Type  Chronic pain                       [facility] Adult PT Treatment/Exercise - 06/12/18 1607       Self-Care   Self-Care  Other Self-Care Comments    ADL's  initial education on body mechanics    Posture  initial posture on sitting and standing    Other Self-Care Comments   initial education on pain science and sleep hygiene      Exercises   Exercises  Lumbar      Lumbar Exercises: Stretches   Single Knee to Chest Stretch  Right;Left;2 reps;30 seconds    Lower Trunk Rotation  2 reps;20 seconds    Pelvic Tilt  10 reps    Pelvic Tilt Limitations  x 2    Other Lumbar Stretch Exercise  childs pose  1 x 20  and 10 sec RT and LT       Lumbar Exercises: Supine   Ab Set  10 reps    AB Set Limitations  10 sec reps    Other Supine Lumbar Exercises  decompression exericises  shoulder press 6 x, , head press  6 xleg lengthener 2-3 sec 6 x  and  leg press 2-3 sec each Each leg 6 x               PT Education - 06/12/18 1733    Education Details  initial posture and body mechanics, added to HEP with pelvic tilt and ab set and decompression ex    Person(s) Educated  Patient    Methods  Explanation;Demonstration;Tactile cues;Verbal cues;Handout    Comprehension  Verbalized understanding;Returned demonstration       PT Short Term Goals - 06/01/18 1038      PT SHORT TERM GOAL #1   Title  She will be independent with initial HEp     Time  2    Period  Weeks    Status  New      PT SHORT TERM GOAL #2   Title  She will report pain decrease 20% or more with home tasks    Time  2    Period  Weeks    Status  New      PT SHORT TERM GOAL #3   Title  She will report being more aware of body mechanics with decr bend and twisting    Time  3    Period  Weeks    Status  New        PT Long Term Goals - 06/01/18 1039      PT LONG TERM GOAL #1   Title  She will be independent with all HEP issued    Time  5    Period  Weeks    Status  New      PT LONG TERM GOAL #2   Title  She will report pain decreased 30-40% overall     Time  5    Period  Weeks    Status  New      PT  LONG TERM GOAL #3   Title  She will report pain decreased with sitting and able to sit for 60 min without incr pain    Time  5    Period  Weeks    Status  New      PT LONG TERM GOAL #4   Title      FOTO score will decr to 34% limited from 45% limited    Time  5    Period  Weeks    Status  New            Plan - 06/12/18 1734    Clinical Impression Statement  ms Eye complains of 6/10 pain in back and symptoms in to left leg /calf,  Pt took extra time and had many questions regarding pain and symptoms.  Pt 6/10 pain would wax and wain with exercise but not clear pattern being able to be discerned.  Pt seems very fearful of movement or harming her back and  initially was introduced topain science and the benefit of movement/ exercise. Will continue     Rehab Potential  Good    PT Frequency  2x / week    PT Duration  6 weeks    PT Treatment/Interventions  Passive range of motion;Manual techniques;Patient/family education;Therapeutic exercise;Therapeutic activities;Moist Heat;Electrical Stimulation;Traction;Dry needling    PT Next Visit Plan  REview stretcing and progress core strength. Modalites and manual as needed  posture/adl handout given , Need to go over in more detail. May try traction for symptom releif    PT Home Exercise Plan  LTR , SKC, childs pose , side bend trunk stretchAb set, pelvic  tilt , decompression ex ( needs reinforcement    Consulted and Agree with Plan of Care  Patient       Patient will benefit from skilled therapeutic intervention in order to improve the following deficits and impairments:  Pain, Postural dysfunction, Decreased activity tolerance, Decreased range of motion, Increased muscle spasms  Visit Diagnosis: Chronic bilateral low back pain with sciatica, sciatica laterality unspecified  Abnormal posture     Problem List Patient Active Problem List   Diagnosis Date Noted  . Lumbar degenerative disc disease 07/24/2017  . Sadness 07/24/2017  .  Left lumbar radiculopathy 06/02/2017  . Costochondritis, acute 06/02/2017  . Atrophic kidney 11/18/2015  . Bilateral hand pain 11/18/2015  . Chronic lower back pain 11/18/2015  . Bilateral hearing loss 11/18/2015  . Recurrent boils 09/09/2015  . Dysuria 03/25/2015  . Microhematuria 11/14/2014  . Toe pain, right 07/08/2014  . Bunion of great toe of right foot 07/08/2014  . Other screening mammogram 10/22/2013  . Dog bite of finger 08/01/2013  . Swelling of third finger of right hand 01/07/2013  . Cervical radiculitis 11/29/2012  . Allergic rhinitis, cause unspecified 11/29/2012  . Preventative health care 10/19/2012  . Impaired glucose tolerance 10/19/2012  . Alopecia areata 10/19/2012  . Osteoporosis 10/19/2012  . Hyperlipidemia 10/23/2011  . Bloating symptom 10/19/2011  . Onychomycosis 10/19/2011  . Urinary frequency 10/19/2011  . Positive TB test   . Episodic low back pain   . GERD (gastroesophageal reflux disease)   . Cluster headache, episodic     Voncille Lo, PT Certified Exercise Expert for the Aging Adult  06/12/18 5:38 PM Phone: [phone] Fax: [facility] [address] [place], [place], [address] Phone: [phone]   Fax:  [phone]  Name: ARYELLA BESECKER MRN: [id number] Date of Birth: December 23, 1950

## 2018-06-13 ENCOUNTER — Ambulatory Visit: Payer: Medicare HMO | Admitting: Physical Therapy

## 2018-06-13 ENCOUNTER — Encounter: Payer: Self-pay | Admitting: Physical Therapy

## 2018-06-13 DIAGNOSIS — M5440 Lumbago with sciatica, unspecified side: Principal | ICD-10-CM

## 2018-06-13 DIAGNOSIS — R293 Abnormal posture: Secondary | ICD-10-CM

## 2018-06-13 DIAGNOSIS — G8929 Other chronic pain: Secondary | ICD-10-CM

## 2018-06-13 NOTE — Therapy (Signed)
[place], [place], [address] Phone: [phone]   Fax:  [phone]  Physical Therapy Treatment  Patient Details  Name: Beverly Shaw MRN: [id number] Date of Birth: May 21, 1951 Referring Provider (PT): Marlaine Hind , MD   Encounter Date: 06/13/2018  PT End of Session - 06/13/18 1652    Visit Number  3    Number of Visits  10    Date for PT Re-Evaluation  07/06/18    Authorization Type  [employer]    Authorization Time Period  progress note at visit 10 ,  KX at visit 15    PT Start Time  1543    PT Stop Time  1634    PT Time Calculation (min)  51 min    Activity Tolerance  Patient tolerated treatment well    Behavior During Therapy  [facility] for tasks assessed/performed       Past Medical History:  Diagnosis Date  . Atrophic kidney 11/18/2015  . Episodic low back pain   . GERD (gastroesophageal reflux disease)   . Heart murmur   . Migraines   . Osteopenia 10/15/2010  . Osteoporosis, unspecified 10/19/2012  . Positive TB test    Positive TB skin test    Past Surgical History:  Procedure Laterality Date  . ABDOMINAL HYSTERECTOMY  1996  . TUBAL LIGATION      There were no vitals filed for this visit.  Subjective Assessment - 06/13/18 1539    Subjective  Less spasms   1 x so far today.                         [facility] Adult PT Treatment/Exercise - 06/13/18 0001      Self-Care   Other Self-Care Comments   bed mobility roll vs sit up,  how to pick up coat ( painful even with cues)      Exercises   Exercises  Lumbar      Lumbar Exercises: Stretches   Single Knee to Chest Stretch  1 rep;10 seconds    Single Knee to Chest Stretch Limitations  painful low back vs stretch    Double Knee to Chest Stretch  3 reps;10 seconds    Double Knee to Chest Stretch Limitations  legs on ball to     Lower Trunk Rotation  2 reps;20 seconds    Other Lumbar Stretch Exercise  childs pose  1 x 20   and 10 sec RT and LT       Lumbar Exercises: Supine   Large Ball Abdominal Isometric  5 reps   3 sets  both and single hand rolling ball up thigh,  cued    Other Supine Lumbar Exercises  decompression exericises  shoulder press 6 x, , head press  6 xleg lengthener 2-3 sec 6 x  and leg press 2-3 sec each Each leg 6 x     Also decompression 5 minutes head 1 pillow and 1 folded towe     Manual Therapy   Manual Therapy  Soft tissue mobilization    Manual therapy comments  low back both paraspinals instrument assist at times.  trigger point release left  ,  tissue softened.  decreased pain             PT Education - 06/13/18 1651    Education Details  exercise form,  self care    Person(s) Educated  Patient  Methods  Explanation;Demonstration;Tactile cues;Verbal cues    Comprehension  Returned demonstration;Verbalized understanding;Need further instruction       PT Short Term Goals - 06/01/18 1038      PT SHORT TERM GOAL #1   Title  She will be independent with initial HEp     Time  2    Period  Weeks    Status  New      PT SHORT TERM GOAL #2   Title  She will report pain decrease 20% or more with home tasks    Time  2    Period  Weeks    Status  New      PT SHORT TERM GOAL #3   Title  She will report being more aware of body mechanics with decr bend and twisting    Time  3    Period  Weeks    Status  New        PT Long Term Goals - 06/01/18 1039      PT LONG TERM GOAL #1   Title  She will be independent with all HEP issued    Time  5    Period  Weeks    Status  New      PT LONG TERM GOAL #2   Title  She will report pain decreased 30-40% overall     Time  5    Period  Weeks    Status  New      PT LONG TERM GOAL #3   Title  She will report pain decreased with sitting and able to sit for 60 min without incr pain    Time  5    Period  Weeks    Status  New      PT LONG TERM GOAL #4   Title      FOTO score will decr to 34% limited from 45% limited     Time  5    Period  Weeks    Status  New            Plan - 06/13/18 1652    Clinical Impression Statement  Spasm significantly decreased today. (only 1 spasm)  exercise requires mod cues for current HEP.  She needs body mechanics education soon.     PT Next Visit Plan  REview stretcing and progress core strength. Modalites and manual as needed  posture/adl handout given , Need to go over in more detail. May try traction for symptom releif    PT Home Exercise Plan  LTR , SKC, childs pose , side bend trunk stretchAb set, pelvic tilt , decompression ex ( needs reinforcement    Consulted and Agree with Plan of Care  Patient       Patient will benefit from skilled therapeutic intervention in order to improve the following deficits and impairments:     Visit Diagnosis: Chronic bilateral low back pain with sciatica, sciatica laterality unspecified  Abnormal posture     Problem List Patient Active Problem List   Diagnosis Date Noted  . Lumbar degenerative disc disease 07/24/2017  . Sadness 07/24/2017  . Left lumbar radiculopathy 06/02/2017  . Costochondritis, acute 06/02/2017  . Atrophic kidney 11/18/2015  . Bilateral hand pain 11/18/2015  . Chronic lower back pain 11/18/2015  . Bilateral hearing loss 11/18/2015  . Recurrent boils 09/09/2015  . Dysuria 03/25/2015  . Microhematuria 11/14/2014  . Toe pain, right 07/08/2014  . Bunion of great toe of right foot 07/08/2014  .  Other screening mammogram 10/22/2013  . Dog bite of finger 08/01/2013  . Swelling of third finger of right hand 01/07/2013  . Cervical radiculitis 11/29/2012  . Allergic rhinitis, cause unspecified 11/29/2012  . Preventative health care 10/19/2012  . Impaired glucose tolerance 10/19/2012  . Alopecia areata 10/19/2012  . Osteoporosis 10/19/2012  . Hyperlipidemia 10/23/2011  . Bloating symptom 10/19/2011  . Onychomycosis 10/19/2011  . Urinary frequency 10/19/2011  . Positive TB test   . Episodic low  back pain   . GERD (gastroesophageal reflux disease)   . Cluster headache, episodic     HARRIS,KAREN  PTA 06/13/2018, 4:55 PM  [facility] [address] [place], [place], [address] Phone: [phone]   Fax:  [phone]  Name: Beverly Shaw MRN: [id number] Date of Birth: 28-Jul-1950

## 2018-06-19 ENCOUNTER — Ambulatory Visit: Payer: Medicare HMO | Admitting: Physical Therapy

## 2018-06-19 ENCOUNTER — Encounter: Payer: Self-pay | Admitting: Physical Therapy

## 2018-06-19 DIAGNOSIS — G8929 Other chronic pain: Secondary | ICD-10-CM | POA: Diagnosis not present

## 2018-06-19 DIAGNOSIS — R293 Abnormal posture: Secondary | ICD-10-CM | POA: Diagnosis not present

## 2018-06-19 DIAGNOSIS — M5440 Lumbago with sciatica, unspecified side: Principal | ICD-10-CM

## 2018-06-19 NOTE — Patient Instructions (Signed)

## 2018-06-20 NOTE — Therapy (Signed)
[place], [place], [address] Phone: [phone]   Fax:  [phone]  Physical Therapy Treatment  Patient Details  Name: Beverly Shaw MRN: [id number] Date of Birth: 10-19-1950 Referring Provider (PT): Marlaine Hind , MD   Encounter Date: 06/19/2018  PT End of Session - 06/19/18 0716    Visit Number  4    Number of Visits  10    Date for PT Re-Evaluation  07/06/18    Authorization Type  [employer]    Authorization Time Period  progress note at visit 10 ,  KX at visit 15    PT Start Time  1500    PT Stop Time  1544    PT Time Calculation (min)  44 min    Activity Tolerance  Patient tolerated treatment well    Behavior During Therapy  [facility] for tasks assessed/performed       Past Medical History:  Diagnosis Date  . Atrophic kidney 11/18/2015  . Episodic low back pain   . GERD (gastroesophageal reflux disease)   . Heart murmur   . Migraines   . Osteopenia 10/15/2010  . Osteoporosis, unspecified 10/19/2012  . Positive TB test    Positive TB skin test    Past Surgical History:  Procedure Laterality Date  . ABDOMINAL HYSTERECTOMY  1996  . TUBAL LIGATION      There were no vitals filed for this visit.  Subjective Assessment - 06/19/18 1504    Subjective  Spasm X 1 simce last visit.   Lifted a box from floor and pain increased.  better with hot bath and ibuprophem    Up to 6-7/10.  Doing the exercises    Currently in Pain?  No/denies    Pain Location  Back    Pain Orientation  Left;Lower;Right    Pain Descriptors / Indicators  Tightness;Sore    Aggravating Factors   sitting  heavy lifting.    vacume,  gardening     Pain Relieving Factors  meds over the conter hot bath                       [facility] Adult PT Treatment/Exercise - 06/19/18 0001      Therapeutic Activites    Therapeutic Activities  ADL's;Lifting    Lifting  golfer's , Kettle bell different heights,  Pole used for  neutral posture  (Pended)    ADL simulation  increased back pain with lifting.  Needs cue     Lumbar Exercises: Stretches   Passive Hamstring Stretch Limitations  3 X 30  both PROM    Lower Trunk Rotation  5 reps   2 sets  ROM improving   Piriformis Stretch  3 reps;10 seconds;Left;Right    Other Lumbar Stretch Exercise  Quadratus lumborum stretch on side AA  (Pended)       Lumbar Exercises: Supine   Bridge  10 reps    Isometric Hip Flexion  5 reps;5 seconds  (Pended)     Isometric Hip Flexion Limitations  to level pelvis  (Pended)       Manual Therapy   Manual Therapy  Soft tissue mobilization  (Pended)              PT Education - 06/19/18 1550    Education Details  exercise ADL    Person(s) Educated  Patient    Methods  Explanation;Demonstration;Tactile cues;Verbal cues;Handout    Comprehension  Returned demonstration;Verbalized  understanding       PT Short Term Goals - 06/01/18 1038      PT SHORT TERM GOAL #1   Title  She will be independent with initial HEp     Time  2    Period  Weeks    Status  New      PT SHORT TERM GOAL #2   Title  She will report pain decrease 20% or more with home tasks    Time  2    Period  Weeks    Status  New      PT SHORT TERM GOAL #3   Title  She will report being more aware of body mechanics with decr bend and twisting    Time  3    Period  Weeks    Status  New        PT Long Term Goals - 06/01/18 1039      PT LONG TERM GOAL #1   Title  She will be independent with all HEP issued    Time  5    Period  Weeks    Status  New      PT LONG TERM GOAL #2   Title  She will report pain decreased 30-40% overall     Time  5    Period  Weeks    Status  New      PT LONG TERM GOAL #3   Title  She will report pain decreased with sitting and able to sit for 60 min without incr pain    Time  5    Period  Weeks    Status  New      PT LONG TERM GOAL #4   Title      FOTO score will decr to 34% limited from 45% limited    Time   5    Period  Weeks    Status  New            Plan - 06/19/18 4196    Clinical Impression Statement  ADL ed today increased spasm with lifting. Patient needs more core and practice. Pain addressed with manual.  No pain at end of session    PT Next Visit Plan  Core.  ADL    PT Home Exercise Plan  LTR , SKC, childs pose , side bend trunk stretchAb set, pelvic tilt , decompression ex ( needs reinforcement    Consulted and Agree with Plan of Care  Patient       Patient will benefit from skilled therapeutic intervention in order to improve the following deficits and impairments:     Visit Diagnosis: Chronic bilateral low back pain with sciatica, sciatica laterality unspecified  Abnormal posture     Problem List Patient Active Problem List   Diagnosis Date Noted  . Lumbar degenerative disc disease 07/24/2017  . Sadness 07/24/2017  . Left lumbar radiculopathy 06/02/2017  . Costochondritis, acute 06/02/2017  . Atrophic kidney 11/18/2015  . Bilateral hand pain 11/18/2015  . Chronic lower back pain 11/18/2015  . Bilateral hearing loss 11/18/2015  . Recurrent boils 09/09/2015  . Dysuria 03/25/2015  . Microhematuria 11/14/2014  . Toe pain, right 07/08/2014  . Bunion of great toe of right foot 07/08/2014  . Other screening mammogram 10/22/2013  . Dog bite of finger 08/01/2013  . Swelling of third finger of right hand 01/07/2013  . Cervical radiculitis 11/29/2012  . Allergic rhinitis, cause unspecified 11/29/2012  . Preventative health care  10/19/2012  . Impaired glucose tolerance 10/19/2012  . Alopecia areata 10/19/2012  . Osteoporosis 10/19/2012  . Hyperlipidemia 10/23/2011  . Bloating symptom 10/19/2011  . Onychomycosis 10/19/2011  . Urinary frequency 10/19/2011  . Positive TB test   . Episodic low back pain   . GERD (gastroesophageal reflux disease)   . Cluster headache, episodic     Isys Tietje  PTA 06/20/2018, 7:21 AM  [facility] [address] [place], [place], [address] Phone: [phone]   Fax:  [phone]  Name: Beverly Shaw MRN: [id number] Date of Birth: [birth year]

## 2018-06-25 ENCOUNTER — Ambulatory Visit: Payer: Medicare HMO | Attending: Physical Medicine and Rehabilitation | Admitting: Physical Therapy

## 2018-06-25 ENCOUNTER — Encounter: Payer: Self-pay | Admitting: Physical Therapy

## 2018-06-25 DIAGNOSIS — M5440 Lumbago with sciatica, unspecified side: Secondary | ICD-10-CM | POA: Diagnosis not present

## 2018-06-25 DIAGNOSIS — R293 Abnormal posture: Secondary | ICD-10-CM | POA: Insufficient documentation

## 2018-06-25 DIAGNOSIS — G8929 Other chronic pain: Secondary | ICD-10-CM

## 2018-06-25 NOTE — Therapy (Signed)
[place], [place], [address] Phone: [phone]   Fax:  [phone]  Physical Therapy Treatment  Patient Details  Name: Beverly Shaw MRN: [id number] Date of Birth: 11/18/1950 Referring Provider (PT): Marlaine Hind , MD   Encounter Date: 06/25/2018  PT End of Session - 06/25/18 1515    Visit Number  5    Number of Visits  10    Date for PT Re-Evaluation  07/06/18    Authorization Type  [employer]    Authorization Time Period  progress note at visit 10 ,  KX at visit 15    PT Start Time  1508    PT Stop Time  1553    PT Time Calculation (min)  45 min    Activity Tolerance  Patient tolerated treatment well    Behavior During Therapy  [facility] for tasks assessed/performed       Past Medical History:  Diagnosis Date  . Atrophic kidney 11/18/2015  . Episodic low back pain   . GERD (gastroesophageal reflux disease)   . Heart murmur   . Migraines   . Osteopenia 10/15/2010  . Osteoporosis, unspecified 10/19/2012  . Positive TB test    Positive TB skin test    Past Surgical History:  Procedure Laterality Date  . ABDOMINAL HYSTERECTOMY  1996  . TUBAL LIGATION      There were no vitals filed for this visit.  Subjective Assessment - 06/25/18 1510    Subjective  "When I left here on Thursday I had throbbing pain that lasted all weekend. It was an 8/10 pain while I was driving home and the pain got worse. But I'm feeling better today. I didn't do any of my exercises, but I did take some tylenol and used a heating pad."Pt reports practicing vaccuming with the techniques discussed last tx, but her pain inhibitted her this weekend.     Currently in Pain?  Yes    Pain Score  5     Pain Location  Back    Aggravating Factors   siting, heavy lifting, gardening     Pain Relieving Factors  OTC meds                                 PT Short Term Goals - 06/01/18 1038      PT SHORT TERM  GOAL #1   Title  She will be independent with initial HEp     Time  2    Period  Weeks    Status  New      PT SHORT TERM GOAL #2   Title  She will report pain decrease 20% or more with home tasks    Time  2    Period  Weeks    Status  New      PT SHORT TERM GOAL #3   Title  She will report being more aware of body mechanics with decr bend and twisting    Time  3    Period  Weeks    Status  New        PT Long Term Goals - 06/01/18 1039      PT LONG TERM GOAL #1   Title  She will be independent with all HEP issued    Time  5    Period  Weeks    Status  New  PT LONG TERM GOAL #2   Title  She will report pain decreased 30-40% overall     Time  5    Period  Weeks    Status  New      PT LONG TERM GOAL #3   Title  She will report pain decreased with sitting and able to sit for 60 min without incr pain    Time  5    Period  Weeks    Status  New      PT LONG TERM GOAL #4   Title      FOTO score will decr to 34% limited from 45% limited    Time  5    Period  Weeks    Status  New            Plan - 06/25/18 1549    Clinical Impression Statement  Pt was inhibitted by pain today d/t having prolonged back pain all weekend. Therapist applied IFC and MHP to reduce pain.     PT Next Visit Plan  Core.  ADL    PT Home Exercise Plan  LTR , SKC, childs pose , side bend trunk stretchAb set, pelvic tilt , decompression ex ( needs reinforcement    Consulted and Agree with Plan of Care  Patient       Patient will benefit from skilled therapeutic intervention in order to improve the following deficits and impairments:     Visit Diagnosis: Chronic bilateral low back pain with sciatica, sciatica laterality unspecified  Abnormal posture     Problem List Patient Active Problem List   Diagnosis Date Noted  . Lumbar degenerative disc disease 07/24/2017  . Sadness 07/24/2017  . Left lumbar radiculopathy 06/02/2017  . Costochondritis, acute 06/02/2017  . Atrophic  kidney 11/18/2015  . Bilateral hand pain 11/18/2015  . Chronic lower back pain 11/18/2015  . Bilateral hearing loss 11/18/2015  . Recurrent boils 09/09/2015  . Dysuria 03/25/2015  . Microhematuria 11/14/2014  . Toe pain, right 07/08/2014  . Bunion of great toe of right foot 07/08/2014  . Other screening mammogram 10/22/2013  . Dog bite of finger 08/01/2013  . Swelling of third finger of right hand 01/07/2013  . Cervical radiculitis 11/29/2012  . Allergic rhinitis, cause unspecified 11/29/2012  . Preventative health care 10/19/2012  . Impaired glucose tolerance 10/19/2012  . Alopecia areata 10/19/2012  . Osteoporosis 10/19/2012  . Hyperlipidemia 10/23/2011  . Bloating symptom 10/19/2011  . Onychomycosis 10/19/2011  . Urinary frequency 10/19/2011  . Positive TB test   . Episodic low back pain   . GERD (gastroesophageal reflux disease)   . Cluster headache, episodic     Deveron Furlong 06/26/2018, 8:29 AM  [facility] [address] [place], [place], [address] Phone: [phone]   Fax:  [phone]  Name: Beverly Shaw MRN: [id number] Date of Birth: 27-Jan-1951

## 2018-06-26 ENCOUNTER — Other Ambulatory Visit: Payer: Self-pay | Admitting: Internal Medicine

## 2018-06-27 ENCOUNTER — Ambulatory Visit: Payer: Medicare HMO | Admitting: Physical Therapy

## 2018-06-27 ENCOUNTER — Encounter: Payer: Self-pay | Admitting: Physical Therapy

## 2018-06-27 DIAGNOSIS — G8929 Other chronic pain: Secondary | ICD-10-CM

## 2018-06-27 DIAGNOSIS — M5440 Lumbago with sciatica, unspecified side: Secondary | ICD-10-CM | POA: Diagnosis not present

## 2018-06-27 DIAGNOSIS — R293 Abnormal posture: Secondary | ICD-10-CM

## 2018-06-27 NOTE — Therapy (Signed)
[place], [place], [address] Phone: [phone]   Fax:  [phone]  Physical Therapy Treatment  Patient Details  Name: Beverly Shaw MRN: [id number] Date of Birth: Oct 15, 1950 Referring Provider (PT): Marlaine Hind , MD   Encounter Date: 06/27/2018  PT End of Session - 06/27/18 1827    Visit Number  6    Number of Visits  10    Date for PT Re-Evaluation  07/06/18    Authorization Type  [employer]    Authorization Time Period  progress note at visit 10 ,  KX at visit 15    PT Start Time  1550    PT Stop Time  1640    PT Time Calculation (min)  50 min    Activity Tolerance  Patient tolerated treatment well    Behavior During Therapy  [facility] for tasks assessed/performed       Past Medical History:  Diagnosis Date  . Atrophic kidney 11/18/2015  . Episodic low back pain   . GERD (gastroesophageal reflux disease)   . Heart murmur   . Migraines   . Osteopenia 10/15/2010  . Osteoporosis, unspecified 10/19/2012  . Positive TB test    Positive TB skin test    Past Surgical History:  Procedure Laterality Date  . ABDOMINAL HYSTERECTOMY  1996  . TUBAL LIGATION      There were no vitals filed for this visit.  Subjective Assessment - 06/27/18 1600    Subjective  Pain returns when i get home and do things.  I and trying to use good posture and body mechanics.  I trhink I am always going to have pain.  No pain in legs the last 2 days    Currently in Pain?  Yes    Pain Score  4     Pain Location  Back    Pain Orientation  Lower    Pain Descriptors / Indicators  Tightness;Sore;Spasm   gets toe spasms  when she relaxes in evening  and places    Pain Type  Chronic pain    Pain Radiating Towards   only into hips.      Aggravating Factors   sitting too long,      Pain Relieving Factors  lying with hands in low back. in supine.     Effect of Pain on Daily Activities  not lifting,  not yet back into the yard.                          [facility] Adult PT Treatment/Exercise - 06/27/18 0001      Self-Care   Posture  sitting review    Other Self-Care Comments   TENS unit operation,   Trial sitting on folded towel,  trial standing on different heights for leg length changes with scoliosis.  Trial of full ( all 3 levels) heel lift in shoe left.  decreased pain with walking      Lumbar Exercises: Stretches   Prone on Elbows Stretch  1 rep;30 seconds    Prone on Elbows Stretch Limitations  feels good      Lumbar Exercises: Supine   Ab Set  10 reps    Heel Slides Limitations  small steps marching away and back with bracing  5 X  cues    Bent Knee Raise  5 reps    Bent Knee Raise Limitations  cued technique with bracing  Bridge  10 reps   2 sets            PT Education - 06/27/18 1826    Education Details  self care.   use of TENS,  heel lift    Person(s) Educated  Patient    Methods  Explanation;Demonstration;Verbal cues    Comprehension  Returned demonstration;Verbalized understanding;Need further instruction   TENS may need more instruction      PT Short Term Goals - 06/27/18 1830      PT SHORT TERM GOAL #1   Title  She will be independent with initial HEp     Baseline  independent    Time  2    Period  Weeks    Status  Achieved      PT SHORT TERM GOAL #2   Title  She will report pain decrease 20% or more with home tasks    Baseline  not yet changed    Time  2    Period  Weeks    Status  On-going      PT SHORT TERM GOAL #3   Title  She will report being more aware of body mechanics with decr bend and twisting    Baseline  she is more aware.  she has decreased bending and twisting    Time  3    Period  Weeks    Status  Achieved        PT Long Term Goals - 06/01/18 1039      PT LONG TERM GOAL #1   Title  She will be independent with all HEP issued    Time  5    Period  Weeks    Status  New      PT LONG TERM GOAL #2   Title  She will report pain  decreased 30-40% overall     Time  5    Period  Weeks    Status  New      PT LONG TERM GOAL #3   Title  She will report pain decreased with sitting and able to sit for 60 min without incr pain    Time  5    Period  Weeks    Status  New      PT LONG TERM GOAL #4   Title      FOTO score will decr to 34% limited from 45% limited    Time  5    Period  Weeks    Status  New            Plan - 06/27/18 1828    Clinical Impression Statement  exercise and self care focus today.  trial of heel lift promising with pain decrease with walking.  it will not help sitting pain,.  scoliosis a factor in her pain.   STG#1, #3 met.     PT Next Visit Plan  Core.  ADL.  Answer any TENS questions.  ask about heel lift is going.  Problem solve sitting pain.     PT Home Exercise Plan  LTR , SKC, childs pose , side bend trunk stretchAb set, pelvic tilt , decompression ex ( needs reinforcement    Consulted and Agree with Plan of Care  Patient       Patient will benefit from skilled therapeutic intervention in order to improve the following deficits and impairments:     Visit Diagnosis: Chronic bilateral low back pain with sciatica, sciatica laterality unspecified  Abnormal posture  Problem List Patient Active Problem List   Diagnosis Date Noted  . Lumbar degenerative disc disease 07/24/2017  . Sadness 07/24/2017  . Left lumbar radiculopathy 06/02/2017  . Costochondritis, acute 06/02/2017  . Atrophic kidney 11/18/2015  . Bilateral hand pain 11/18/2015  . Chronic lower back pain 11/18/2015  . Bilateral hearing loss 11/18/2015  . Recurrent boils 09/09/2015  . Dysuria 03/25/2015  . Microhematuria 11/14/2014  . Toe pain, right 07/08/2014  . Bunion of great toe of right foot 07/08/2014  . Other screening mammogram 10/22/2013  . Dog bite of finger 08/01/2013  . Swelling of third finger of right hand 01/07/2013  . Cervical radiculitis 11/29/2012  . Allergic rhinitis, cause unspecified  11/29/2012  . Preventative health care 10/19/2012  . Impaired glucose tolerance 10/19/2012  . Alopecia areata 10/19/2012  . Osteoporosis 10/19/2012  . Hyperlipidemia 10/23/2011  . Bloating symptom 10/19/2011  . Onychomycosis 10/19/2011  . Urinary frequency 10/19/2011  . Positive TB test   . Episodic low back pain   . GERD (gastroesophageal reflux disease)   . Cluster headache, episodic     ,  PTA 06/27/2018, 6:32 PM  [facility] [address] [place], [place], [address] Phone: [phone]   Fax:  [phone]  Name: Beverly Shaw MRN: [id number] Date of Birth: Jun 18, 1950

## 2018-06-27 NOTE — Patient Instructions (Signed)
Remove heel lift if irritating

## 2018-07-02 ENCOUNTER — Ambulatory Visit: Payer: Medicare HMO

## 2018-07-02 DIAGNOSIS — G8929 Other chronic pain: Secondary | ICD-10-CM

## 2018-07-02 DIAGNOSIS — M5440 Lumbago with sciatica, unspecified side: Secondary | ICD-10-CM | POA: Diagnosis not present

## 2018-07-02 DIAGNOSIS — R293 Abnormal posture: Secondary | ICD-10-CM | POA: Diagnosis not present

## 2018-07-02 NOTE — Patient Instructions (Signed)
quadra ped arm and leg lifts  5-10 each side  daily, childs,s pose,    Straight and to side  1-2x/day 1-2 reps  Daily  15-30 sec

## 2018-07-02 NOTE — Therapy (Signed)
[place], [place], [address] Phone: [phone]   Fax:  [phone]  Physical Therapy Treatment  Patient Details  Name: Beverly Shaw MRN: [id number] Date of Birth: 09/05/1950 Referring Provider (PT): Marlaine Hind , MD   Encounter Date: 07/02/2018  PT End of Session - 07/02/18 1547    Visit Number  7    Number of Visits  10    Date for PT Re-Evaluation  07/06/18    Authorization Type  [employer]    Authorization Time Period  progress note at visit 10 ,  KX at visit 15    PT Start Time  0300    PT Stop Time  0345    PT Time Calculation (min)  45 min    Activity Tolerance  Patient tolerated treatment well;No increased pain    Behavior During Therapy  WFL for tasks assessed/performed       Past Medical History:  Diagnosis Date  . Atrophic kidney 11/18/2015  . Episodic low back pain   . GERD (gastroesophageal reflux disease)   . Heart murmur   . Migraines   . Osteopenia 10/15/2010  . Osteoporosis, unspecified 10/19/2012  . Positive TB test    Positive TB skin test    Past Surgical History:  Procedure Laterality Date  . ABDOMINAL HYSTERECTOMY  1996  . TUBAL LIGATION      There were no vitals filed for this visit.  Subjective Assessment - 07/02/18 1506    Subjective  She reports this time from last session pain was some better.    She has been medicating for cold so may be better from this and resting.     Currently in Pain?  Yes    Pain Score  2     Pain Location  Back    Pain Orientation  Lower    Pain Descriptors / Indicators  Tightness;Sore    Pain Type  Chronic pain    Pain Onset  More than a month ago    Pain Frequency  Constant    Aggravating Factors   stting    Pain Relieving Factors  lyeing with lumar support                       [facility] Adult PT Treatment/Exercise - 07/02/18 0001      Lumbar Exercises: Supine   Bent Knee Raise  10 reps    Bent Knee Raise  Limitations  ab set with lift     Other Supine Lumbar Exercises  part sit up x 10 cued to exhale with raise.     Other Supine Lumbar Exercises  decompression in supine 5 reps x 5 sec chin tucks , shoulder press  leg press   1 pillow     Lumbar Exercises: Sidelying   Clam  Right;Left;15 reps    Clam Limitations  red band    Hip Abduction  Right;Left;10 reps    Hip Abduction Limitations  red band      Lumbar Exercises: Quadruped   Opposite Arm/Leg Raise  Right arm/Left leg;Left arm/Right leg;10 reps             PT Education - 07/02/18 1546    Education Details  HEP    Person(s) Educated  Patient    Methods  Explanation;Demonstration;Tactile cues;Verbal cues;Handout    Comprehension  Verbalized understanding;Returned demonstration       PT Short Term Goals - 06/27/18 1830  PT SHORT TERM GOAL #1   Title  She will be independent with initial HEp     Baseline  independent    Time  2    Period  Weeks    Status  Achieved      PT SHORT TERM GOAL #2   Title  She will report pain decrease 20% or more with home tasks    Baseline  not yet changed    Time  2    Period  Weeks    Status  On-going      PT SHORT TERM GOAL #3   Title  She will report being more aware of body mechanics with decr bend and twisting    Baseline  she is more aware.  she has decreased bending and twisting    Time  3    Period  Weeks    Status  Achieved        PT Long Term Goals - 06/01/18 1039      PT LONG TERM GOAL #1   Title  She will be independent with all HEP issued    Time  5    Period  Weeks    Status  New      PT LONG TERM GOAL #2   Title  She will report pain decreased 30-40% overall     Time  5    Period  Weeks    Status  New      PT LONG TERM GOAL #3   Title  She will report pain decreased with sitting and able to sit for 60 min without incr pain    Time  5    Period  Weeks    Status  New      PT LONG TERM GOAL #4   Title      FOTO score will decr to 34% limited from  45% limited    Time  5    Period  Weeks    Status  New            Plan - 07/02/18 1509    Clinical Impression Statement  Questionalbe imroveent though did well today.   No increased pain.    Will see how she does later this week. Add core stability for HEP.     PT Treatment/Interventions  Passive range of motion;Manual techniques;Patient/family education;Therapeutic exercise;Therapeutic activities;Moist Heat;Electrical Stimulation;Traction;Dry needling    PT Next Visit Plan   ADL.  Answer any TENS questions.   Problem solve sitting pain. Progress HEP     PT Home Exercise Plan  LTR , [place], childs pose , side bend trunk stretchAb set, pelvic tilt , decompression ex ( needs reinforcement,  quadraped, childs pose straight and side stretch    Consulted and Agree with Plan of Care  Patient       Patient will benefit from skilled therapeutic intervention in order to improve the following deficits and impairments:  Pain, Postural dysfunction, Decreased activity tolerance, Decreased range of motion, Increased muscle spasms  Visit Diagnosis: Chronic bilateral low back pain with sciatica, sciatica laterality unspecified  Abnormal posture     Problem List Patient Active Problem List   Diagnosis Date Noted  . Lumbar degenerative disc disease 07/24/2017  . Sadness 07/24/2017  . Left lumbar radiculopathy 06/02/2017  . Costochondritis, acute 06/02/2017  . Atrophic kidney 11/18/2015  . Bilateral hand pain 11/18/2015  . Chronic lower back pain 11/18/2015  . Bilateral hearing loss 11/18/2015  . Recurrent boils 09/09/2015  .  Dysuria 03/25/2015  . Microhematuria 11/14/2014  . Toe pain, right 07/08/2014  . Bunion of great toe of right foot 07/08/2014  . Other screening mammogram 10/22/2013  . Dog bite of finger 08/01/2013  . Swelling of third finger of right hand 01/07/2013  . Cervical radiculitis 11/29/2012  . Allergic rhinitis, cause unspecified 11/29/2012  . Preventative health care  10/19/2012  . Impaired glucose tolerance 10/19/2012  . Alopecia areata 10/19/2012  . Osteoporosis 10/19/2012  . Hyperlipidemia 10/23/2011  . Bloating symptom 10/19/2011  . Onychomycosis 10/19/2011  . Urinary frequency 10/19/2011  . Positive TB test   . Episodic low back pain   . GERD (gastroesophageal reflux disease)   . Cluster headache, episodic     Darrel Hoover  PT 07/02/2018, 4:32 PM  [facility] [address] [place], [place], [address] Phone: [phone]   Fax:  [phone]  Name: Beverly Shaw MRN: [id number] Date of Birth: 1951-01-22

## 2018-07-05 ENCOUNTER — Ambulatory Visit: Payer: Medicare HMO | Admitting: Internal Medicine

## 2018-07-05 ENCOUNTER — Ambulatory Visit: Payer: Medicare HMO

## 2018-07-18 ENCOUNTER — Encounter: Payer: Medicare HMO | Admitting: Physical Therapy

## 2018-07-24 ENCOUNTER — Ambulatory Visit: Payer: Medicare HMO | Attending: Physical Medicine and Rehabilitation

## 2018-07-24 DIAGNOSIS — M62830 Muscle spasm of back: Secondary | ICD-10-CM | POA: Diagnosis not present

## 2018-07-24 DIAGNOSIS — M5442 Lumbago with sciatica, left side: Secondary | ICD-10-CM

## 2018-07-24 DIAGNOSIS — M5440 Lumbago with sciatica, unspecified side: Secondary | ICD-10-CM | POA: Diagnosis not present

## 2018-07-24 DIAGNOSIS — G8929 Other chronic pain: Secondary | ICD-10-CM | POA: Insufficient documentation

## 2018-07-24 DIAGNOSIS — R293 Abnormal posture: Secondary | ICD-10-CM | POA: Diagnosis not present

## 2018-07-24 NOTE — Therapy (Signed)
[place], [place], [address] Phone: [phone]   Fax:  [phone]  Physical Therapy Treatment  Patient Details  Name: Beverly Shaw MRN: [id number] Date of Birth: Jul 06, 1950 Referring Provider (PT): Marlaine Hind , MD   Encounter Date: 07/24/2018  PT End of Session - 07/24/18 1233    Visit Number  8    Number of Visits  10    Authorization Type  [employer]    Authorization Time Period  progress note at visit 10 ,  KX at visit 15    PT Start Time  1230    PT Stop Time  1325    PT Time Calculation (min)  55 min    Activity Tolerance  Patient tolerated treatment well;No increased pain    Behavior During Therapy  WFL for tasks assessed/performed       Past Medical History:  Diagnosis Date  . Atrophic kidney 11/18/2015  . Episodic low back pain   . GERD (gastroesophageal reflux disease)   . Heart murmur   . Migraines   . Osteopenia 10/15/2010  . Osteoporosis, unspecified 10/19/2012  . Positive TB test    Positive TB skin test    Past Surgical History:  Procedure Laterality Date  . ABDOMINAL HYSTERECTOMY  1996  . TUBAL LIGATION      There were no vitals filed for this visit.  Subjective Assessment - 07/24/18 1236    Subjective  She reports she was sick and not able to come to PT.      Her back is better . Then pain will be there . Need to control with exer and heat.       Pain Score  2     Pain Location  Back    Pain Orientation  Lower    Pain Descriptors / Indicators  Sore;Tightness    Pain Type  Chronic pain    Pain Onset  More than a month ago    Pain Frequency  Constant    Aggravating Factors   sit    Pain Relieving Factors  rest with support.                        [place] Adult PT Treatment/Exercise - 07/24/18 0001      Lumbar Exercises: Stretches   Single Knee to Chest Stretch  Right;Left;1 rep;30 seconds    Lower Trunk Rotation  1 rep;30 seconds    Lower Trunk  Rotation Limitations  RT/LT     Pelvic Tilt  10 reps;5 seconds      Lumbar Exercises: Supine   Ab Set  10 reps    Bent Knee Raise  10 reps    Bent Knee Raise Limitations  ab set with lift     Other Supine Lumbar Exercises  part sit up x 10 cued to exhale with raise.     Other Supine Lumbar Exercises  decompression in supine 8  reps x 5 sec chin tucks , shoulder press  , leg press      Lumbar Exercises: Sidelying   Clam  Right;Left;15 reps    Clam Limitations  red band    Hip Abduction  Right;Left;10 reps               PT Short Term Goals - 07/24/18 1238      PT SHORT TERM GOAL #1   Title  She will be independent with initial HEp  Status  Achieved      PT SHORT TERM GOAL #2   Title  She will report pain decrease 20% or more with home tasks    Status  Achieved      PT SHORT TERM GOAL #3   Title  She will report being more aware of body mechanics with decr bend and twisting    Baseline  she is more aware.  she has decreased bending and twisting    Status  Achieved        PT Long Term Goals - 07/24/18 1238      PT LONG TERM GOAL #1   Title  She will be independent with all HEP issued    Status  On-going      PT LONG TERM GOAL #2   Title  She will report pain decreased 30-40% overall     Baseline  50% better    Status  Achieved      PT LONG TERM GOAL #3   Title  She will report pain decreased with sitting and able to sit for 60 min without incr pain    Baseline  > 60 min with support    Status  Achieved      PT LONG TERM GOAL #4   Title      FOTO score will decr to 34% limited from 45% limited    Baseline  Today 50% limited though self report pain improved 50%    Status  On-going            Plan - 07/24/18 1242    Clinical Impression Statement  Ms Dobies declined modalities as she was more sore at end of session Overall she  reports she is improved with less pain and consisitency with HEP . She is interested in HEP progression so will extend 4  more week to see if we can progress  HEP with reps    PT Treatment/Interventions  Passive range of motion;Manual techniques;Patient/family education;Therapeutic exercise;Therapeutic activities;Moist Heat;Electrical Stimulation;Traction;Dry needling    PT Next Visit Plan   ADL  Progress HEP [place] program    PT Home Exercise Plan  LTR , [place], childs pose , side bend trunk stretchAb set, pelvic tilt , decompression ex ( needs reinforcement,  quadraped, childs pose straight and side stretch    Consulted and Agree with Plan of Care  Patient       Patient will benefit from skilled therapeutic intervention in order to improve the following deficits and impairments:  Pain, Postural dysfunction, Decreased activity tolerance, Decreased range of motion, Increased muscle spasms  Visit Diagnosis: Chronic bilateral low back pain with sciatica, sciatica laterality unspecified  Abnormal posture  Muscle spasm of back     Problem List Patient Active Problem List   Diagnosis Date Noted  . Lumbar degenerative disc disease 07/24/2017  . Sadness 07/24/2017  . Left lumbar radiculopathy 06/02/2017  . Costochondritis, acute 06/02/2017  . Atrophic kidney 11/18/2015  . Bilateral hand pain 11/18/2015  . Chronic lower back pain 11/18/2015  . Bilateral hearing loss 11/18/2015  . Recurrent boils 09/09/2015  . Dysuria 03/25/2015  . Microhematuria 11/14/2014  . Toe pain, right 07/08/2014  . Bunion of great toe of right foot 07/08/2014  . Other screening mammogram 10/22/2013  . Dog bite of finger 08/01/2013  . Swelling of third finger of right hand 01/07/2013  . Cervical radiculitis 11/29/2012  . Allergic rhinitis, cause unspecified 11/29/2012  . Preventative health care 10/19/2012  . Impaired glucose  tolerance 10/19/2012  . Alopecia areata 10/19/2012  . Osteoporosis 10/19/2012  . Hyperlipidemia 10/23/2011  . Bloating symptom 10/19/2011  . Onychomycosis 10/19/2011  . Urinary frequency 10/19/2011  .  Positive TB test   . Episodic low back pain   . GERD (gastroesophageal reflux disease)   . Cluster headache, episodic     Darrel Hoover  PT  07/24/2018, 1:14 PM  [facility] [address] [place], [place], [address] Phone: [phone]   Fax:  [phone]  Name: Beverly Shaw MRN: [id number] Date of Birth: 1950/09/15

## 2018-08-01 ENCOUNTER — Other Ambulatory Visit: Payer: Self-pay

## 2018-08-01 ENCOUNTER — Ambulatory Visit: Payer: Medicare HMO

## 2018-08-01 DIAGNOSIS — M5440 Lumbago with sciatica, unspecified side: Principal | ICD-10-CM

## 2018-08-01 DIAGNOSIS — M62830 Muscle spasm of back: Secondary | ICD-10-CM | POA: Diagnosis not present

## 2018-08-01 DIAGNOSIS — G8929 Other chronic pain: Secondary | ICD-10-CM

## 2018-08-01 DIAGNOSIS — R293 Abnormal posture: Secondary | ICD-10-CM

## 2018-08-01 NOTE — Patient Instructions (Signed)
Red band shoulder ext/row and punches  X 15-30 reps 1x/day. Can do one side at time for rotation stability

## 2018-08-01 NOTE — Therapy (Addendum)
[place], [place], [address] Phone: [phone]   Fax:  [phone]  Physical Therapy Treatment/Discharge  Patient Details  Name: Beverly Shaw MRN: [id number] Date of Birth: 10-Feb-1951 Referring Provider (PT): Marlaine Hind , MD   Encounter Date: 08/01/2018  PT End of Session - 08/01/18 1235    Visit Number  9    Number of Visits  12    Date for PT Re-Evaluation  08/24/18    Authorization Type  [employer]    Authorization Time Period  progress note at visit 10 ,  KX at visit 15    PT Start Time  1231    PT Stop Time  1310    PT Time Calculation (min)  39 min    Activity Tolerance  Patient tolerated treatment well;No increased pain    Behavior During Therapy  WFL for tasks assessed/performed       Past Medical History:  Diagnosis Date  . Atrophic kidney 11/18/2015  . Episodic low back pain   . GERD (gastroesophageal reflux disease)   . Heart murmur   . Migraines   . Osteopenia 10/15/2010  . Osteoporosis, unspecified 10/19/2012  . Positive TB test    Positive TB skin test    Past Surgical History:  Procedure Laterality Date  . ABDOMINAL HYSTERECTOMY  1996  . TUBAL LIGATION      There were no vitals filed for this visit.  Subjective Assessment - 08/01/18 1237    Subjective  Back a little more sore due to yard work. Doing exercise and heat pad. Generally better.     Pain Score  3     Pain Location  Back    Pain Orientation  Lower    Pain Descriptors / Indicators  Sore;Tightness    Pain Type  Chronic pain    Pain Onset  More than a month ago    Pain Frequency  Constant    Aggravating Factors   yard sork                       [facility] Adult PT Treatment/Exercise - 08/01/18 0001      Lumbar Exercises: Stretches   Single Knee to Chest Stretch  Right;Left;1 rep;30 seconds    Lower Trunk Rotation  1 rep;30 seconds    Lower Trunk Rotation Limitations  RT/LT     Pelvic Tilt   15 reps;5 seconds      Lumbar Exercises: Standing   Other Standing Lumbar Exercises  red band punches and row and shoulder exetension x 15 bilateral      Lumbar Exercises: Supine   Bent Knee Raise  15 reps    Bent Knee Raise Limitations  ab set with lift     Other Supine Lumbar Exercises  part sit up x 15 cued to exhale with raise.     Other Supine Lumbar Exercises  decompression in supine 8  reps x 5 sec chin tucks , shoulder press  , leg press      Lumbar Exercises: Sidelying   Clam  Right;Left;20 reps    Clam Limitations  red band    Hip Abduction  Right;Left;15 reps      Lumbar Exercises: Quadruped   Straight Leg Raises Limitations  RT/LT   12 reps             PT Education - 08/01/18 1308    Education Details  HEP  Person(s) Educated  Patient    Methods  Explanation;Demonstration;Tactile cues;Verbal cues;Handout    Comprehension  Returned demonstration;Verbalized understanding       PT Short Term Goals - 07/24/18 1238      PT SHORT TERM GOAL #1   Title  She will be independent with initial HEp     Status  Achieved      PT SHORT TERM GOAL #2   Title  She will report pain decrease 20% or more with home tasks    Status  Achieved      PT SHORT TERM GOAL #3   Title  She will report being more aware of body mechanics with decr bend and twisting    Baseline  she is more aware.  she has decreased bending and twisting    Status  Achieved        PT Long Term Goals - 08/01/18 1309      PT LONG TERM GOAL #1   Title  She will be independent with all HEP issued    Status  On-going      PT LONG TERM GOAL #2   Title  She will report pain decreased 50-60% overall     Time  5    Period  Weeks    Status  Revised      PT LONG TERM GOAL #3   Title  She will report pain decreased with sitting and able to sit for 60 min without incr pain    Baseline  > 60 min with support    Status  Achieved      PT LONG TERM GOAL #4   Title      FOTO score will decr to 34%  limited from 45% limited    Status  On-going            Plan - 08/01/18 1234    Clinical Impression Statement  No increased pain with incr reps and added band exercises. Progress as able reps or exercise     PT Treatment/Interventions  Passive range of motion;Manual techniques;Patient/family education;Therapeutic exercise;Therapeutic activities;Moist Heat;Electrical Stimulation;Traction;Dry needling    PT Next Visit Plan    Progress HEP [place] program                        FOTO                         Progress 10 visit    PT Home Exercise Plan  LTR , [place], childs pose , side bend trunk stretchAb set, pelvic tilt , decompression ex ( needs reinforcement,  quadraped, childs pose straight and side stretch    Consulted and Agree with Plan of Care  Patient       Patient will benefit from skilled therapeutic intervention in order to improve the following deficits and impairments:  Pain, Postural dysfunction, Decreased activity tolerance, Decreased range of motion, Increased muscle spasms  Visit Diagnosis: Chronic bilateral low back pain with sciatica, sciatica laterality unspecified  Abnormal posture  Muscle spasm of back     Problem List Patient Active Problem List   Diagnosis Date Noted  . Lumbar degenerative disc disease 07/24/2017  . Sadness 07/24/2017  . Left lumbar radiculopathy 06/02/2017  . Costochondritis, acute 06/02/2017  . Atrophic kidney 11/18/2015  . Bilateral hand pain 11/18/2015  . Chronic lower back pain 11/18/2015  . Bilateral hearing loss 11/18/2015  . Recurrent boils 09/09/2015  . Dysuria  03/25/2015  . Microhematuria 11/14/2014  . Toe pain, right 07/08/2014  . Bunion of great toe of right foot 07/08/2014  . Other screening mammogram 10/22/2013  . Dog bite of finger 08/01/2013  . Swelling of third finger of right hand 01/07/2013  . Cervical radiculitis 11/29/2012  . Allergic rhinitis, cause unspecified 11/29/2012  . Preventative health care  10/19/2012  . Impaired glucose tolerance 10/19/2012  . Alopecia areata 10/19/2012  . Osteoporosis 10/19/2012  . Hyperlipidemia 10/23/2011  . Bloating symptom 10/19/2011  . Onychomycosis 10/19/2011  . Urinary frequency 10/19/2011  . Positive TB test   . Episodic low back pain   . GERD (gastroesophageal reflux disease)   . Cluster headache, episodic     Darrel Hoover  PT 08/01/2018, 1:11 PM  [facility] [address] [place], [place], [address] Phone: [phone]   Fax:  [phone]  Name: BRYLYN NOVAKOVICH MRN: [id number] Date of Birth: 1951-03-04 PHYSICAL THERAPY DISCHARGE SUMMARY  Visits from Start of Care: 9  Current functional level related to goals / functional outcomes: The corona virus caused the clinic to close. Beverly Shaw was contacted about options and she chose to discontinue PT for the time being   Remaining deficits: See above   Education / Equipment: HEP Plan: Patient agrees to discharge.  Patient goals were partially met. Patient is being discharged due to the patient's request.  ?????    Pearson Forster PT   09/11/18

## 2018-08-08 ENCOUNTER — Ambulatory Visit: Payer: Medicare HMO | Admitting: Physical Therapy

## 2018-08-23 ENCOUNTER — Ambulatory Visit: Payer: Medicare HMO

## 2018-08-29 ENCOUNTER — Telehealth (HOSPITAL_COMMUNITY): Payer: Self-pay | Admitting: Physical Therapy

## 2018-08-29 NOTE — Telephone Encounter (Signed)
Message was left with information on possible tele health options as the clinic is closed until May 1st.  She was asked to call clinic with questions and clinic number [phone] was also left on message.

## 2018-10-18 DIAGNOSIS — L814 Other melanin hyperpigmentation: Secondary | ICD-10-CM | POA: Diagnosis not present

## 2018-10-18 DIAGNOSIS — D2271 Melanocytic nevi of right lower limb, including hip: Secondary | ICD-10-CM | POA: Diagnosis not present

## 2018-10-18 DIAGNOSIS — D225 Melanocytic nevi of trunk: Secondary | ICD-10-CM | POA: Diagnosis not present

## 2018-10-18 DIAGNOSIS — L237 Allergic contact dermatitis due to plants, except food: Secondary | ICD-10-CM | POA: Diagnosis not present

## 2018-10-18 DIAGNOSIS — D224 Melanocytic nevi of scalp and neck: Secondary | ICD-10-CM | POA: Diagnosis not present

## 2018-10-18 DIAGNOSIS — L821 Other seborrheic keratosis: Secondary | ICD-10-CM | POA: Diagnosis not present

## 2018-11-16 ENCOUNTER — Other Ambulatory Visit: Payer: Self-pay | Admitting: Obstetrics and Gynecology

## 2018-11-16 DIAGNOSIS — Z1231 Encounter for screening mammogram for malignant neoplasm of breast: Secondary | ICD-10-CM

## 2018-11-26 ENCOUNTER — Other Ambulatory Visit (INDEPENDENT_AMBULATORY_CARE_PROVIDER_SITE_OTHER): Payer: Medicare HMO

## 2018-11-26 DIAGNOSIS — Z0000 Encounter for general adult medical examination without abnormal findings: Secondary | ICD-10-CM | POA: Diagnosis not present

## 2018-11-26 LAB — CBC WITH DIFFERENTIAL/PLATELET
Basophils Absolute: 0 10*3/uL (ref 0.0–0.1)
Basophils Relative: 0.5 % (ref 0.0–3.0)
Eosinophils Absolute: 0.1 10*3/uL (ref 0.0–0.7)
Eosinophils Relative: 1.4 % (ref 0.0–5.0)
HCT: 40.3 % (ref 36.0–46.0)
Hemoglobin: 13.4 g/dL (ref 12.0–15.0)
Lymphocytes Relative: 36.3 % (ref 12.0–46.0)
Lymphs Abs: 2.4 10*3/uL (ref 0.7–4.0)
MCHC: 33.3 g/dL (ref 30.0–36.0)
MCV: 87.5 fl (ref 78.0–100.0)
Monocytes Absolute: 0.7 10*3/uL (ref 0.1–1.0)
Monocytes Relative: 10.4 % (ref 3.0–12.0)
Neutro Abs: 3.4 10*3/uL (ref 1.4–7.7)
Neutrophils Relative %: 51.4 % (ref 43.0–77.0)
Platelets: 210 10*3/uL (ref 150.0–400.0)
RBC: 4.6 Mil/uL (ref 3.87–5.11)
RDW: 13.5 % (ref 11.5–15.5)
WBC: 6.7 10*3/uL (ref 4.0–10.5)

## 2018-11-26 LAB — LIPID PANEL
Cholesterol: 216 mg/dL — ABNORMAL HIGH (ref 0–200)
HDL: 48.6 mg/dL (ref >39.00)
NonHDL: 167.78
Total CHOL/HDL Ratio: 4
Triglycerides: 250 mg/dL — ABNORMAL HIGH (ref 0.0–149.0)
VLDL: 50 mg/dL — ABNORMAL HIGH (ref 0.0–40.0)

## 2018-11-26 LAB — BASIC METABOLIC PANEL
BUN: 20 mg/dL (ref 6–23)
CO2: 29 mEq/L (ref 19–32)
Calcium: 9.2 mg/dL (ref 8.4–10.5)
Chloride: 104 mEq/L (ref 96–112)
Creatinine, Ser: 0.75 mg/dL (ref 0.40–1.20)
GFR: 77.21 mL/min (ref >60.00)
Glucose, Bld: 94 mg/dL (ref 70–99)
Potassium: 3.9 mEq/L (ref 3.5–5.1)
Sodium: 142 mEq/L (ref 135–145)

## 2018-11-26 LAB — URINALYSIS, ROUTINE W REFLEX MICROSCOPIC
Bilirubin Urine: NEGATIVE
Hgb urine dipstick: NEGATIVE
Ketones, ur: NEGATIVE
Leukocytes,Ua: NEGATIVE
Nitrite: NEGATIVE
RBC / HPF: NONE SEEN (ref 0–?)
Specific Gravity, Urine: 1.02 (ref 1.000–1.030)
Total Protein, Urine: NEGATIVE
Urine Glucose: NEGATIVE
Urobilinogen, UA: 0.2 (ref 0.0–1.0)
pH: 6.5 (ref 5.0–8.0)

## 2018-11-26 LAB — HEPATIC FUNCTION PANEL
ALT: 21 U/L (ref 0–35)
AST: 22 U/L (ref 0–37)
Albumin: 4.3 g/dL (ref 3.5–5.2)
Alkaline Phosphatase: 50 U/L (ref 39–117)
Bilirubin, Direct: 0.1 mg/dL (ref 0.0–0.3)
Total Bilirubin: 0.6 mg/dL (ref 0.2–1.2)
Total Protein: 6.8 g/dL (ref 6.0–8.3)

## 2018-11-26 LAB — TSH: TSH: 1.4 u[IU]/mL (ref 0.35–4.50)

## 2018-11-26 LAB — LDL CHOLESTEROL, DIRECT: Direct LDL: 126 mg/dL

## 2018-11-27 ENCOUNTER — Encounter: Payer: Self-pay | Admitting: Internal Medicine

## 2018-11-27 ENCOUNTER — Ambulatory Visit (INDEPENDENT_AMBULATORY_CARE_PROVIDER_SITE_OTHER): Payer: Medicare HMO | Admitting: Internal Medicine

## 2018-11-27 ENCOUNTER — Other Ambulatory Visit: Payer: Self-pay

## 2018-11-27 ENCOUNTER — Ambulatory Visit (INDEPENDENT_AMBULATORY_CARE_PROVIDER_SITE_OTHER)
Admission: RE | Admit: 2018-11-27 | Discharge: 2018-11-27 | Disposition: A | Discharge status: Home / Self Care | Payer: Medicare HMO | Source: Ambulatory Visit | Attending: Internal Medicine | Admitting: Internal Medicine

## 2018-11-27 ENCOUNTER — Telehealth: Payer: Self-pay

## 2018-11-27 VITALS — BP 136/88 | HR 75 | Temp 98.1°F | Ht 62.0 in | Wt 130.0 lb

## 2018-11-27 DIAGNOSIS — E538 Deficiency of other specified B group vitamins: Secondary | ICD-10-CM

## 2018-11-27 DIAGNOSIS — G5601 Carpal tunnel syndrome, right upper limb: Secondary | ICD-10-CM | POA: Insufficient documentation

## 2018-11-27 DIAGNOSIS — R079 Chest pain, unspecified: Secondary | ICD-10-CM | POA: Diagnosis not present

## 2018-11-27 DIAGNOSIS — E611 Iron deficiency: Secondary | ICD-10-CM | POA: Diagnosis not present

## 2018-11-27 DIAGNOSIS — R0781 Pleurodynia: Secondary | ICD-10-CM

## 2018-11-27 DIAGNOSIS — Z0001 Encounter for general adult medical examination with abnormal findings: Secondary | ICD-10-CM | POA: Diagnosis not present

## 2018-11-27 DIAGNOSIS — Z23 Encounter for immunization: Secondary | ICD-10-CM | POA: Diagnosis not present

## 2018-11-27 DIAGNOSIS — M810 Age-related osteoporosis without current pathological fracture: Secondary | ICD-10-CM

## 2018-11-27 DIAGNOSIS — E559 Vitamin D deficiency, unspecified: Secondary | ICD-10-CM | POA: Diagnosis not present

## 2018-11-27 DIAGNOSIS — Z91030 Bee allergy status: Secondary | ICD-10-CM

## 2018-11-27 DIAGNOSIS — E785 Hyperlipidemia, unspecified: Secondary | ICD-10-CM

## 2018-11-27 DIAGNOSIS — M545 Low back pain, unspecified: Secondary | ICD-10-CM

## 2018-11-27 DIAGNOSIS — Z0000 Encounter for general adult medical examination without abnormal findings: Secondary | ICD-10-CM

## 2018-11-27 MED ORDER — EPINEPHRINE 0.3 MG/0.3ML IJ SOAJ: 0.3000 mg | INTRAMUSCULAR | 1 refills | Status: DC | PRN

## 2018-11-27 NOTE — Progress Notes (Signed)
Subjective:    Patient ID: Beverly Shaw, female    DOB: 11-07-1950, 68 y.o.   MRN: [id number]  HPI  Here for wellness and f/u;  Overall doing ok;  Pt denies Chest pain, worsening SOB, DOE, wheezing, orthopnea, PND, worsening LE edema, palpitations, dizziness or syncope.  Pt denies neurological change such as new headache, facial or extremity weakness.  Pt denies polydipsia, polyuria, or low sugar symptoms. Pt states overall good compliance with treatment and medications, good tolerability, and has been trying to follow appropriate diet.  Pt denies worsening depressive symptoms, suicidal ideation or panic. No fever, night sweats, wt loss, loss of appetite, or other constitutional symptoms.  Pt states good ability with ADL's, has low fall risk, home safety reviewed and adequate, no other significant changes in hearing or vision, and only occasionally active with exercise.  Never got the second shingrix shot earlier this year as they tried to charge > $200 when she had already paid > $100 for the first as copay  Still working 4 days/wk on [employer], no plans to retire and enjoys working..    Also last wk had RUE swelling mod to severe after 12 yellowjacket sting (second episode in her life) occurred with working in the yard, which she likes to do.  Keeping benadryl at home and claritin now.     Also c/o mod intermittent right hand weakness, stinging pain with turning the hand and throbbing, worse to use the hand better to rest, ongoing for several months.  Husband has CTS so she is thinking she has the same. Does use the hands intensely every day  Also the left lower costal margin rib cage seems to be changing more prominent and "sticking out" and somewhat tender at times, dull, worse to do more working  Admits to less low chol diet recently after she is married but plans to return to [employer], declines statin    Pt continues to have recurring LBP without change in severity, bowel or bladder  change, fever, wt loss,  worsening LE pain/numbness/weakness, gait change or falls, followed by Dr Brien Few, s/p ESI x 5 with little improvement, referred for PT but essentially missed most of it due to pandemic shutdown.  Has known LS spine scoliosis, DDD and DJD.    Also last DXA was mar 2019 with osteoporosis, did have prolia given x 1 but too expensive with that insurance as > $500 every 6 mo Past Medical History:  Diagnosis Date  . Atrophic kidney 11/18/2015  . Episodic low back pain   . GERD (gastroesophageal reflux disease)   . Heart murmur   . Migraines   . Osteopenia 10/15/2010  . Osteoporosis, unspecified 10/19/2012  . Positive TB test    Positive TB skin test   Past Surgical History:  Procedure Laterality Date  . ABDOMINAL HYSTERECTOMY  1996  . TUBAL LIGATION      reports that she has quit smoking. She has never used smokeless tobacco. She reports current alcohol use. She reports that she does not use drugs. family history includes Diabetes in an other family member; Heart disease in her father and mother. Allergies  Allergen Reactions  . Yellow Jacket Venom [Bee Venom]    Current Outpatient Medications on File Prior to Visit  Medication Sig Dispense Refill  . alendronate (FOSAMAX) 70 MG tablet TAKE 1 TABLET BY MOUTH EVERY 7 (SEVEN) DAYS. TAKE WITH A FULL GLASS OF WATER ON AN EMPTY STOMACH. 12 tablet 3  .  calcium carbonate (OS-CAL) 600 MG TABS Take 600 mg by mouth daily.      Marland Kitchen gabapentin (NEURONTIN) 100 MG capsule Take 1 capsule (100 mg total) by mouth 3 (three) times daily. 90 capsule 3  . Multiple Vitamin (MULTIVITAMIN) capsule Take 1 capsule by mouth daily.      . traMADol (ULTRAM) 50 MG tablet Take 1 tablet (50 mg total) by mouth every 6 (six) hours as needed. 60 tablet 1   No current facility-administered medications on file prior to visit.    Review of Systems Constitutional: Negative for other unusual diaphoresis, sweats, appetite or weight changes HENT: Negative  for other worsening hearing loss, ear pain, facial swelling, mouth sores or neck stiffness.   Eyes: Negative for other worsening pain, redness or other visual disturbance.  Respiratory: Negative for other stridor or swelling Cardiovascular: Negative for other palpitations or other chest pain  Gastrointestinal: Negative for worsening diarrhea or loose stools, blood in stool, distention or other pain Genitourinary: Negative for hematuria, flank pain or other change in urine volume.  Musculoskeletal: Negative for myalgias or other joint swelling.  Skin: Negative for other color change, or other wound or worsening drainage.  Neurological: Negative for other syncope or numbness. Hematological: Negative for other adenopathy or swelling Psychiatric/Behavioral: Negative for hallucinations, other worsening agitation, SI, self-injury, or new decreased concentration All other system neg per pt    Objective:   Physical Exam BP 136/88   Pulse 75   Temp 98.1 F (36.7 C) (Oral)   Ht 5\' 2"  (1.575 m)   Wt 130 lb (59 kg)   SpO2 97%   BMI 23.78 kg/m  VS noted,  Constitutional: Pt is oriented to person, place, and time. Appears well-developed and well-nourished, in no significant distress and comfortable Head: Normocephalic and atraumatic  Eyes: Conjunctivae and EOM are normal. Pupils are equal, round, and reactive to light Right Ear: External ear normal without discharge Left Ear: External ear normal without discharge Nose: Nose without discharge or deformity Mouth/Throat: Oropharynx is without other ulcerations and moist  Neck: Normal range of motion. Neck supple. No JVD present. No tracheal deviation present or significant neck LA or mass Cardiovascular: Normal rate, regular rhythm, normal heart sounds and intact distal pulses.   Pulmonary/Chest: WOB normal and breath sounds without rales or wheezing  Abdominal: Soft. Bowel sounds are normal. NT. No HSM  Musculoskeletal: Normal range of motion.  Exhibits no edema, does have somewhat tender left costal margin somewhat protruding, also with scoliosis low back Lymphadenopathy: Has no other cervical adenopathy.  Neurological: Pt is alert and oriented to person, place, and time. Pt has normal reflexes. No cranial nerve deficit. Motor grossly intact, Gait intact Skin: Skin is warm and dry. No rash noted or new ulcerations Psychiatric:  Has normal mood and affect. Behavior is normal without agitation No other exam findings Lab Results  Component Value Date   WBC 6.7 11/26/2018   HGB 13.4 11/26/2018   HCT 40.3 11/26/2018   PLT 210.0 11/26/2018   GLUCOSE 94 11/26/2018   CHOL 216 (H) 11/26/2018   TRIG 250.0 (H) 11/26/2018   HDL 48.60 11/26/2018   LDLDIRECT 126.0 11/26/2018   LDLCALC 132 (H) 11/15/2017   ALT 21 11/26/2018   AST 22 11/26/2018   NA 142 11/26/2018   K 3.9 11/26/2018   CL 104 11/26/2018   CREATININE 0.75 11/26/2018   BUN 20 11/26/2018   CO2 29 11/26/2018   TSH 1.40 11/26/2018   HGBA1C 5.4 10/16/2013  Assessment & Plan:

## 2018-11-27 NOTE — Assessment & Plan Note (Signed)
Suspect this is MSK, possible protuberant moreso due to worsening scoliosis and effect on left rib cage, for cxr, rib films

## 2018-11-27 NOTE — Assessment & Plan Note (Addendum)
D/w pt significance of angioedema, ok for epipen for recurrent sting allergy symptoms such as anaphylaxis  In addition to the time spent performing CPE, I spent an additional 25 minutes face to face,in which greater than 50% of this time was spent in counseling and coordination of care for patient's acute illness as documented, including the differential dx, treatment, further evaluation and other management of bee sting allergy, HLD, osteoporosis, low back pain, rib pain left, and right CTS

## 2018-11-27 NOTE — Patient Instructions (Addendum)
You had the Pneumovax pneumonia shot and the Tdap tetanus shot today  Please take all new medication as prescribed - the Epipen pak to be used if needed (keep one injection in the car, the other in the house)  You will be contacted regarding the referral for: Prolia, probably by Jonelle Sidle here in the office  Remember to ask again at the pharmacy about the Shingrix shot #2 as it sounds as if a mistake was made on trying to charge for the second shot, when I believe both should have been covered with copay at the time of the first shot  Please continue all other medications as before, and refills have been done if requested.  Please have the pharmacy call with any other refills you may need.  Please continue your efforts at being more active, low cholesterol diet, and weight control.  You are otherwise up to date with prevention measures today.  Please keep your appointments with your specialists as you may have planned  You will be contacted regarding the referral for: Dr Amedeo Plenty and the Nerve testing for the right arm  Please consider making an appt with Dr [facility] for the lower back and further advice on pain control  Please go to the XRAY Department in the Basement (go straight as you get off the elevator) for the x-ray testing  You will be contacted by phone if any changes need to be made immediately.  Otherwise, you will receive a letter about your results with an explanation, but please check with MyChart first.  Please remember to sign up for MyChart if you have not done so, as this will be important to you in the future with finding out test results, communicating by private email, and scheduling acute appointments online when needed.  Please return in 1 year for your yearly visit, or sooner if needed, with Lab testing done 3-5 days before

## 2018-11-27 NOTE — Assessment & Plan Note (Signed)

## 2018-11-27 NOTE — Assessment & Plan Note (Signed)
At least moderate, for referral Hand Surgury and NCS/EMG

## 2018-11-27 NOTE — Assessment & Plan Note (Signed)
Chronic recurring, frustrated with ortho, I suggested f/u with Dr Smith/sport medicine

## 2018-11-27 NOTE — Telephone Encounter (Signed)
Patient's insurance will be verified, I will call patient to discuss summary of benefits

## 2018-11-27 NOTE — Telephone Encounter (Signed)
-----   Message from Biagio Borg, MD sent at 11/27/2018  8:48 AM EDT ----- Regarding: restart prolia Please arrange with pt for prolia restart, as she had stopped before due to $500 cost per shot, hoping the cost is now less with different insurance

## 2018-11-27 NOTE — Assessment & Plan Note (Signed)
Will try to restart prolia with message to chief nurse in the office to liason with company

## 2018-11-27 NOTE — Assessment & Plan Note (Signed)
Goal ldl < 70, pt adamant to not wish to try statin at this time

## 2018-11-28 DIAGNOSIS — R69 Illness, unspecified: Secondary | ICD-10-CM | POA: Diagnosis not present

## 2018-12-10 NOTE — Telephone Encounter (Signed)
Pt following up on the prolia information, as she has not heard back. Pt would like call back to know when she can begin again.

## 2018-12-10 NOTE — Telephone Encounter (Signed)
Advised patient I am still waiting to hear from her insurance coverage to get summary of benefits, I will call her when I receive that information and we can begin with injections at that point

## 2018-12-21 DIAGNOSIS — M1811 Unilateral primary osteoarthritis of first carpometacarpal joint, right hand: Secondary | ICD-10-CM | POA: Diagnosis not present

## 2018-12-21 DIAGNOSIS — M79641 Pain in right hand: Secondary | ICD-10-CM | POA: Diagnosis not present

## 2018-12-28 ENCOUNTER — Ambulatory Visit
Admission: RE | Admit: 2018-12-28 | Discharge: 2018-12-28 | Disposition: A | Discharge status: Home / Self Care | Payer: Medicare HMO | Source: Ambulatory Visit | Attending: Obstetrics and Gynecology | Admitting: Obstetrics and Gynecology

## 2018-12-28 ENCOUNTER — Other Ambulatory Visit: Payer: Self-pay

## 2018-12-28 DIAGNOSIS — Z1231 Encounter for screening mammogram for malignant neoplasm of breast: Secondary | ICD-10-CM

## 2019-01-01 NOTE — Telephone Encounter (Addendum)
Relation to pt: self  Call back number: [phone]    Reason for call:  Patient checking on the status of Prolia injection, please advise

## 2019-01-09 ENCOUNTER — Ambulatory Visit (INDEPENDENT_AMBULATORY_CARE_PROVIDER_SITE_OTHER): Payer: Medicare HMO

## 2019-01-09 DIAGNOSIS — M810 Age-related osteoporosis without current pathological fracture: Secondary | ICD-10-CM | POA: Diagnosis not present

## 2019-01-09 MED ORDER — DENOSUMAB 60 MG/ML ~~LOC~~ SOSY
60.0000 mg | PREFILLED_SYRINGE | Freq: Once | SUBCUTANEOUS | Status: AC
  Administered 2019-01-09: 60 mg via SUBCUTANEOUS

## 2019-01-09 NOTE — Progress Notes (Signed)
Medical screening examination/treatment/procedure(s) were performed by non-physician practitioner and as supervising physician I was immediately available for consultation/collaboration. I agree with above. Joelyn Lover, MD   

## 2019-01-10 NOTE — Telephone Encounter (Signed)
Patient has been approved, prolia given 01/09/19

## 2019-01-31 NOTE — Progress Notes (Signed)
[facility] [place], [place] [address] Phone: [phone] Subjective:   I Beverly Shaw am serving as a Education administrator for Dr. Hulan Saas.  CC: Low back pain  [id number]  Beverly Shaw is a 67 y.o. female coming in with complaint of low back pain. States that in 2019 she got an MRI that listed a number of different diagnosis. Has also had back injections as well. Was participating in PT before Covid-19 and states that it was helping her back pain. In the mornings she gets radiating pain and numbness and tingling in her toes. Works in [employer] . States that she is pretty active outdoors and enjoys being active. Sometimes her pain is so bad she can't stand up straight.    Onset- Chronic  Location - lower back/SI joint Duration-  Character- achy  Aggravating factors- sitting for long periods of time Reliving factors-  Therapies tried- heat, ice, topical medications, oral medications, gabapintin (100 mg)  Severity- 7/10 right now at her worse 12/10   Previous imaging that is obtainable by me is a 2005 x-ray showing severe advanced osteoarthritic changes at L4-L5 and mild osteoarthritic changes of the rest of the back  Past Medical History:  Diagnosis Date  . Atrophic kidney 11/18/2015  . Episodic low back pain   . GERD (gastroesophageal reflux disease)   . Heart murmur   . Migraines   . Osteopenia 10/15/2010  . Osteoporosis, unspecified 10/19/2012  . Positive TB test    Positive TB skin test   Past Surgical History:  Procedure Laterality Date  . ABDOMINAL HYSTERECTOMY  1996  . TUBAL LIGATION     Social History   Socioeconomic History  . Marital status: Divorced    Spouse name: Not on file  . Number of children: 3  . Years of education: Not on file  . Highest education level: Not on file  Occupational History  . Occupation: ED Academic librarian [employer]  Social Needs  . Financial resource strain: Not on file  . Food  insecurity    Worry: Not on file    Inability: Not on file  . Transportation needs    Medical: Not on file    Non-medical: Not on file  Tobacco Use  . Smoking status: Former Research scientist (life sciences)  . Smokeless tobacco: Never Used  Substance and Sexual Activity  . Alcohol use: Yes    Comment: once monthly  . Drug use: No  . Sexual activity: Not on file  Lifestyle  . Physical activity    Days per week: Not on file    Minutes per session: Not on file  . Stress: Not on file  Relationships  . Social Herbalist on phone: Not on file    Gets together: Not on file    Attends religious service: Not on file    Active member of club or organization: Not on file    Attends meetings of clubs or organizations: Not on file    Relationship status: Not on file  Other Topics Concern  . Not on file  Social History Narrative  . Not on file   Allergies  Allergen Reactions  . Yellow Jacket Venom [Bee Venom]    Family History  Problem Relation Age of Onset  . Heart disease Mother   . Heart disease Father   . Diabetes Other   . Colon cancer Neg Hx   . Pancreatic cancer Neg Hx   .  Rectal cancer Neg Hx   . Stomach cancer Neg Hx   . Osteoporosis Neg Hx     Current Outpatient Medications (Endocrine & Metabolic):  .  alendronate (FOSAMAX) 70 MG tablet, TAKE 1 TABLET BY MOUTH EVERY 7 (SEVEN) DAYS. TAKE WITH A FULL GLASS OF WATER ON AN EMPTY STOMACH.  Current Outpatient Medications (Cardiovascular):  Marland Kitchen  EPINEPHrine 0.3 mg/0.3 mL IJ SOAJ injection, Inject 0.3 mLs (0.3 mg total) into the muscle as needed for anaphylaxis.   Current Outpatient Medications (Analgesics):  .  traMADol (ULTRAM) 50 MG tablet, Take 1 tablet (50 mg total) by mouth every 6 (six) hours as needed.   Current Outpatient Medications (Other):  .  calcium carbonate (OS-CAL) 600 MG TABS, Take 600 mg by mouth daily.   Marland Kitchen  gabapentin (NEURONTIN) 100 MG capsule, Take 1 capsule (100 mg total) by mouth 3 (three) times daily. .   Multiple Vitamin (MULTIVITAMIN) capsule, Take 1 capsule by mouth daily.   Marland Kitchen  gabapentin (NEURONTIN) 100 MG capsule, Take 2 capsules (200 mg total) by mouth at bedtime.    Past medical history, social, surgical and family history all reviewed in electronic medical record.  No pertanent information unless stated regarding to the chief complaint.   Review of Systems:  No headache, visual changes, nausea, vomiting, diarrhea, constipation, dizziness, abdominal pain, skin rash, fevers, chills, night sweats, weight loss, swollen lymph nodes,  chest pain, shortness of breath, mood changes.  Positive muscle aches, body aches  Objective  Blood pressure 126/84, pulse 73, height 5\' 2"  (1.575 m), weight 132 lb (59.9 kg), SpO2 98 %.    General: No apparent distress alert and oriented x3 mood and affect normal, dressed appropriately.  HEENT: Pupils equal, extraocular movements intact  Respiratory: Patient's speak in full sentences and does not appear short of breath  Cardiovascular: No lower extremity edema, non tender, no erythema  Skin: Warm dry intact with no signs of infection or rash on extremities or on axial skeleton.  Abdomen: Soft nontender  Neuro: Cranial nerves II through XII are intact, neurovascularly intact in all extremities with 2+ DTRs and 2+ pulses.  Lymph: No lymphadenopathy of posterior or anterior cervical chain or axillae bilaterally.  Gait normal with good balance and coordination.  MSK:  Non tender with full range of motion and good stability and symmetric strength and tone of shoulders, elbows, wrist, hip, knee and ankles bilaterally.  Low back exam shows some loss of lordosis with some degenerative scoliosis.  Negative straight leg test, positive Faber test.  5 out of 5 strength of the lower extremities   [id number]; 15 additional minutes spent for Therapeutic exercises as stated in above notes.  This included exercises focusing on stretching, strengthening, with significant focus on  eccentric aspects.   Long term goals include an improvement in range of motion, strength, endurance as well as avoiding reinjury. Patient's frequency would include in 1-2 times a day, 3-5 times a week for a duration of 6-12 weeks. Low back exercises that included:  Pelvic tilt/bracing instruction to focus on control of the pelvic girdle and lower abdominal muscles  Glute strengthening exercises, focusing on proper firing of the glutes without engaging the low back muscles Proper stretching techniques for maximum relief for the hamstrings, hip flexors, low back and some rotation where tolerated  Proper technique shown and discussed handout in great detail with ATC.  All questions were discussed and answered.     Impression and Recommendations:     This  case required medical decision making of moderate complexity. The above documentation has been reviewed and is accurate and complete Lyndal Pulley, DO       Note: This dictation was prepared with Dragon dictation along with smaller phrase technology. Any transcriptional errors that result from this process are unintentional.

## 2019-02-01 ENCOUNTER — Other Ambulatory Visit (INDEPENDENT_AMBULATORY_CARE_PROVIDER_SITE_OTHER): Payer: Medicare HMO

## 2019-02-01 ENCOUNTER — Encounter: Payer: Self-pay | Admitting: Family Medicine

## 2019-02-01 ENCOUNTER — Ambulatory Visit: Payer: Medicare HMO | Admitting: Family Medicine

## 2019-02-01 ENCOUNTER — Other Ambulatory Visit: Payer: Self-pay

## 2019-02-01 VITALS — BP 126/84 | HR 73 | Ht 62.0 in | Wt 132.0 lb

## 2019-02-01 DIAGNOSIS — M2550 Pain in unspecified joint: Secondary | ICD-10-CM | POA: Diagnosis not present

## 2019-02-01 DIAGNOSIS — M5136 Other intervertebral disc degeneration, lumbar region: Secondary | ICD-10-CM

## 2019-02-01 DIAGNOSIS — M545 Low back pain: Secondary | ICD-10-CM

## 2019-02-01 DIAGNOSIS — G8929 Other chronic pain: Secondary | ICD-10-CM | POA: Diagnosis not present

## 2019-02-01 LAB — CBC WITH DIFFERENTIAL/PLATELET
Basophils Absolute: 0 10*3/uL (ref 0.0–0.1)
Basophils Relative: 0.3 % (ref 0.0–3.0)
Eosinophils Absolute: 0.1 10*3/uL (ref 0.0–0.7)
Eosinophils Relative: 1.2 % (ref 0.0–5.0)
HCT: 40.6 % (ref 36.0–46.0)
Hemoglobin: 13.6 g/dL (ref 12.0–15.0)
Lymphocytes Relative: 30.9 % (ref 12.0–46.0)
Lymphs Abs: 1.7 10*3/uL (ref 0.7–4.0)
MCHC: 33.5 g/dL (ref 30.0–36.0)
MCV: 88.1 fl (ref 78.0–100.0)
Monocytes Absolute: 0.5 10*3/uL (ref 0.1–1.0)
Monocytes Relative: 9.7 % (ref 3.0–12.0)
Neutro Abs: 3.2 10*3/uL (ref 1.4–7.7)
Neutrophils Relative %: 57.9 % (ref 43.0–77.0)
Platelets: 196 10*3/uL (ref 150.0–400.0)
RBC: 4.61 Mil/uL (ref 3.87–5.11)
RDW: 13.7 % (ref 11.5–15.5)
WBC: 5.5 10*3/uL (ref 4.0–10.5)

## 2019-02-01 LAB — COMPREHENSIVE METABOLIC PANEL
ALT: 26 U/L (ref 0–35)
AST: 25 U/L (ref 0–37)
Albumin: 4.3 g/dL (ref 3.5–5.2)
Alkaline Phosphatase: 51 U/L (ref 39–117)
BUN: 18 mg/dL (ref 6–23)
CO2: 28 mEq/L (ref 19–32)
Calcium: 8.9 mg/dL (ref 8.4–10.5)
Chloride: 108 mEq/L (ref 96–112)
Creatinine, Ser: 0.66 mg/dL (ref 0.40–1.20)
GFR: 89.43 mL/min (ref >60.00)
Glucose, Bld: 88 mg/dL (ref 70–99)
Potassium: 4 mEq/L (ref 3.5–5.1)
Sodium: 143 mEq/L (ref 135–145)
Total Bilirubin: 0.4 mg/dL (ref 0.2–1.2)
Total Protein: 6.8 g/dL (ref 6.0–8.3)

## 2019-02-01 LAB — URIC ACID: Uric Acid, Serum: 3.7 mg/dL (ref 2.4–7.0)

## 2019-02-01 LAB — C-REACTIVE PROTEIN: CRP: <1 mg/dL (ref 0.5–20.0)

## 2019-02-01 LAB — IBC PANEL
Iron: 67 ug/dL (ref 42–145)
Saturation Ratios: 18.4 % — ABNORMAL LOW (ref 20.0–50.0)
Transferrin: 260 mg/dL (ref 212.0–360.0)

## 2019-02-01 LAB — TSH: TSH: 0.91 u[IU]/mL (ref 0.35–4.50)

## 2019-02-01 LAB — SEDIMENTATION RATE: Sed Rate: 7 mm/hr (ref 0–30)

## 2019-02-01 LAB — FERRITIN: Ferritin: 44 ng/mL (ref 10.0–291.0)

## 2019-02-01 MED ORDER — GABAPENTIN 100 MG PO CAPS: 200.0000 mg | ORAL_CAPSULE | Freq: Every day | ORAL | 3 refills | Status: DC

## 2019-02-01 NOTE — Assessment & Plan Note (Signed)
Patient is on previous x-rays and MRIs have shown the patient does have advanced osteoarthritic changes as well as with what seems to be more of a spinal stenosis.  Discussed changing patient's gabapentin to nighttime, over-the-counter medications given home exercises encourage patient to restart formal physical therapy.  Laboratory work-up to make sure nothing else is contributing to some of the aches and pains patient is having.  Follow-up with me again 4 to 8 weeks

## 2019-02-01 NOTE — Patient Instructions (Addendum)
Labs downstairs PT will call you Exercise 3 times a week Gabapentin 200 mg at night Turmeric 500mg  daily  Tart cherry extract 1200mg  at night Vitamin D 2000 IU daily  See me again in 5 weeks

## 2019-02-04 LAB — VITAMIN D 1,25 DIHYDROXY
Vitamin D 1, 25 (OH)2 Total: 110 pg/mL — ABNORMAL HIGH (ref 18–72)
Vitamin D2 1, 25 (OH)2: <8 pg/mL
Vitamin D3 1, 25 (OH)2: 110 pg/mL

## 2019-02-04 LAB — ANA: Anti Nuclear Antibody (ANA): NEGATIVE

## 2019-02-04 LAB — CYCLIC CITRUL PEPTIDE ANTIBODY, IGG: Cyclic Citrullin Peptide Ab: <16 UNITS

## 2019-02-04 LAB — CALCIUM, IONIZED: Calcium, Ion: 4.84 mg/dL (ref 4.8–5.6)

## 2019-02-04 LAB — ANGIOTENSIN CONVERTING ENZYME: Angiotensin-Converting Enzyme: 33 U/L (ref 9–67)

## 2019-02-04 LAB — PTH, INTACT AND CALCIUM
Calcium: 9 mg/dL (ref 8.6–10.4)
PTH: 37 pg/mL (ref 14–64)

## 2019-02-04 LAB — RHEUMATOID FACTOR: Rheumatoid fact SerPl-aCnc: <14 IU/mL (ref <14)

## 2019-02-15 ENCOUNTER — Other Ambulatory Visit: Payer: Self-pay

## 2019-02-15 ENCOUNTER — Encounter: Payer: Self-pay | Admitting: Physical Therapy

## 2019-02-15 ENCOUNTER — Ambulatory Visit: Payer: Medicare HMO | Attending: Family Medicine | Admitting: Physical Therapy

## 2019-02-15 DIAGNOSIS — M62830 Muscle spasm of back: Secondary | ICD-10-CM | POA: Diagnosis not present

## 2019-02-15 DIAGNOSIS — G8929 Other chronic pain: Secondary | ICD-10-CM | POA: Diagnosis not present

## 2019-02-15 DIAGNOSIS — M5416 Radiculopathy, lumbar region: Secondary | ICD-10-CM

## 2019-02-15 DIAGNOSIS — M5442 Lumbago with sciatica, left side: Secondary | ICD-10-CM | POA: Insufficient documentation

## 2019-02-15 NOTE — Therapy (Signed)
[place], [place], [address] Phone: [phone]   Fax:  [phone]  Physical Therapy Evaluation  Patient Details  Name: Beverly Shaw MRN: [id number] Date of Birth: [birth year] Referring Provider (PT): Dr Hulan Saas    Encounter Date: 02/15/2019  PT End of Session - 02/15/19 1032    Visit Number  1    Number of Visits  12    Date for PT Re-Evaluation  03/29/19    Authorization Type  [employer] Medicare    PT Start Time  0930    PT Stop Time  1016    PT Time Calculation (min)  46 min    Activity Tolerance  Patient tolerated treatment well    Behavior During Therapy  [facility] for tasks assessed/performed       Past Medical History:  Diagnosis Date  . Atrophic kidney 11/18/2015  . Episodic low back pain   . GERD (gastroesophageal reflux disease)   . Heart murmur   . Migraines   . Osteopenia 10/15/2010  . Osteoporosis, unspecified 10/19/2012  . Positive TB test    Positive TB skin test    Past Surgical History:  Procedure Laterality Date  . ABDOMINAL HYSTERECTOMY  1996  . TUBAL LIGATION      There were no vitals filed for this visit.   Subjective Assessment - 02/15/19 0933    Subjective  Patient has had lower back pain for years. She had therapy at the beggining of the year. She flet like therapy was helping but then COVid-19 hit and she had to discharge. She has pain that radiates down her left leg.    How long can you sit comfortably?  can sit around and hour but it depends    How long can you stand comfortably?  improves pain    How long can you walk comfortably?  walking helpls    Diagnostic tests  X-ray: 2015: severe L4-L5 degeneration mild scoliosis    Patient Stated Goals  improve mobility    Currently in Pain?  Yes    Pain Score  7     Pain Location  Back    Pain Orientation  Lower    Pain Descriptors / Indicators  Aching    Pain Type  Chronic pain    Pain Radiating Towards  pain  radiating down her left posterior leg into her left calf    Pain Onset  More than a month ago    Pain Frequency  Constant    Aggravating Factors   Stting increases her pain. She sits 8 hours a day at [facility]    Pain Relieving Factors  Standing and walking improves the pain    Effect of Pain on Daily Activities  difficulty sitting at work for too [place] - 02/15/19 0001      Assessment   Medical Diagnosis  Low Bavck Pain with Lef    Referring Provider (PT)  Dr Hulan Saas     Onset Date/Surgical Date  --   For many years    Hand Dominance  Right    Next MD Visit  October 16th     Prior Therapy  In Febraury of this year but was halted 2nd to Covid 19       Precautions   Precautions  --    Precaution Comments  Osteoperosis/ Osteopenia       Restrictions  Weight Bearing Restrictions  No      Balance Screen   Has the patient fallen in the past 6 months  No    Has the patient had a decrease in activity level because of a fear of falling?   No    Is the patient reluctant to leave their home because of a fear of falling?   No      Home Environment   Additional Comments  Nothing significant       Prior Function   Level of Independence  Independent    Vocation  Full time employment    Vocation Requirements  Works in [employer]. Sitting job.     Leisure  is able to Garden and walk depsite back pain       Cognition   Overall Cognitive Status  Within Functional Limits for tasks assessed    Attention  Focused    Focused Attention  Appears intact    Memory  Appears intact    Awareness  Appears intact    Problem Solving  Appears intact      Observation/Other Assessments   Scoliosis  per x-ray mild levo-scoliosis       Sensation   Light Touch  Appears Intact    Additional Comments  Has pain that radiates down into her posterior calf; has had tingling into the back of her leg in the past       Coordination   Gross Motor  Movements are Fluid and Coordinated  Yes    Fine Motor Movements are Fluid and Coordinated  Yes      Posture/Postural Control   Posture Comments  rounded shoulders      ROM / Strength   AROM / PROM / Strength  AROM;PROM;Strength      AROM   AROM Assessment Site  Lumbar    Lumbar Flexion  38   increased pain    Lumbar Extension  No limit     Lumbar - Right Side Bend  tightness     Lumbar - Left Side Bend  pain on the left side     Lumbar - Right Rotation  no limit     Lumbar - Left Rotation  pain rotating to the left       Strength   Strength Assessment Site  Hip;Knee    Right/Left Hip  Left    Left Hip Flexion  3+/5    Left Hip ABduction  3+/5    Left Hip ADduction  4+/5    Right/Left Knee  Left    Left Knee Extension  3+/5      Palpation   Spinal mobility  increased radicular symptoms with L4 and L2 PA glides     Palpation comment  significant spasming in paraspinals; mild tenderness in upper gluteals       Special Tests   Other special tests  Slump test (-) SLR (-)       Ambulation/Gait   Gait Comments  decreased hip rotation with gait.                 Objective measurements completed on examination: See above findings.              PT Education - 02/15/19 1309    Education Details  HEP and symptom mangement; improtance of improving lumbar flexion    Person(s) Educated  Patient    Methods  Explanation;Demonstration;Tactile cues;Verbal cues    Comprehension  Returned demonstration;Verbal cues required;Verbalized  understanding;Tactile cues required       PT Short Term Goals - 02/15/19 1046      PT SHORT TERM GOAL #1   Title  Patient will increase lumbar flexion by 20 degrees    Time  3    Period  Weeks    Status  New    Target Date  03/08/19      PT SHORT TERM GOAL #2   Title  Patient will report no pain radiating down her left leg    Time  3    Period  Weeks    Status  New    Target Date  03/08/19      PT SHORT TERM GOAL #3    Title  Patient will increase gross left lower extremity strength to 4+/5    Time  3    Period  Weeks    Status  New    Target Date  03/08/19        PT Long Term Goals - 02/15/19 1049      PT LONG TERM GOAL #1   Title  [name] will increase lumbar flexion to 65 degrees in order to bend down and pick item off the ground without pain    Time  6    Period  Weeks    Status  New    Target Date  03/29/19      PT LONG TERM GOAL #2   Title  Patient will sit at work for 4 hours without increased lower back pain    Time  6    Period  Weeks    Status  New    Target Date  03/29/19      PT LONG TERM GOAL #3   Title  Patient will demonstrate a 31 % limitation on FOTO    Time  6    Period  Weeks    Status  New    Target Date  03/29/19             Plan - 02/15/19 1302    Clinical Impression Statement  Patient is a 68 year old female with bilateral lower back pain that radaites into her left lower calf. She has increased pain when she ists at work. She presents with significant tightness in her lumbar paraspinals. She has limited lumbar flexion, She had radicular symptoms with PA glide of L4 and L2. She had PT in the past which helped but she was limited by COvid-19. She was given stretches for work. She was also given information on trigger point dry needling. She has left hip flexion, abdcution and knee extension weakness. She would benefit from skilled therapy to improve her lumbar motion and decrease significant tightness of her lumbar paraspinals.    Personal Factors and Comorbidities  Comorbidity 1;Comorbidity 2    Comorbidities  significant lumbar OA, osteoperosis,    Examination-Activity Limitations  Bend;Squat;Sit    Examination-Participation Restrictions  Driving;Other;Community Activity   work   Stability/Clinical Decision Making  Evolving/Moderate complexity    Clinical Decision Making  Moderate    Rehab Potential  Good    PT Frequency  2x / week    PT Duration  6 weeks     PT Treatment/Interventions  ADLs/Self Care Home Management;Cryotherapy;Electrical Stimulation;Moist Heat;Ultrasound;Gait training;Functional mobility training;Therapeutic activities;Therapeutic exercise;Neuromuscular re-education;Patient/family education;Manual techniques;Passive range of motion;Dry needling;Taping    PT Next Visit Plan  consider TPDN ( patient given FAQ sheet); IASTYM and trigger point release to paraspinals; light PA glides if  beneficial (osteoperosis); consder prayer stretch; consder LAD; bengin light core strengtheing. Only 30 degrees of loubar flexion noted on eval    PT Home Exercise Plan  given stretches for her desk. Tennis ball; hamstring stretch seated, pirifromis stretch bilateral    Consulted and Agree with Plan of Care  Patient       Patient will benefit from skilled therapeutic intervention in order to improve the following deficits and impairments:  Increased muscle spasms, Decreased range of motion, Decreased activity tolerance, Decreased strength, Pain  Visit Diagnosis: Radiculopathy, lumbar region  Chronic bilateral low back pain with left-sided sciatica  Muscle spasm of back     Problem List Patient Active Problem List   Diagnosis Date Noted  . Bee sting allergy 11/27/2018  . Right carpal tunnel syndrome 11/27/2018  . Rib pain on left side 11/27/2018  . Degenerative disc disease, lumbar 07/24/2017  . Sadness 07/24/2017  . Left lumbar radiculopathy 06/02/2017  . Costochondritis, acute 06/02/2017  . Atrophic kidney 11/18/2015  . Bilateral hand pain 11/18/2015  . Chronic lower back pain 11/18/2015  . Bilateral hearing loss 11/18/2015  . Recurrent boils 09/09/2015  . Dysuria 03/25/2015  . Microhematuria 11/14/2014  . Toe pain, right 07/08/2014  . Bunion of great toe of right foot 07/08/2014  . Other screening mammogram 10/22/2013  . Dog bite of finger 08/01/2013  . Swelling of third finger of right hand 01/07/2013  . Cervical radiculitis  11/29/2012  . Allergic rhinitis, cause unspecified 11/29/2012  . Encounter for well adult exam with abnormal findings 10/19/2012  . Impaired glucose tolerance 10/19/2012  . Alopecia areata 10/19/2012  . Osteoporosis 10/19/2012  . Hyperlipidemia 10/23/2011  . Bloating symptom 10/19/2011  . Onychomycosis 10/19/2011  . Urinary frequency 10/19/2011  . Positive TB test   . Episodic low back pain   . GERD (gastroesophageal reflux disease)   . Cluster headache, episodic     Carney Living PT DPT  02/15/2019, 1:14 PM  [facility] [address] [place], [place], [address] Phone: [phone]   Fax:  [phone]  Name: Beverly Shaw MRN: [id number] Date of Birth: 07-21-1950

## 2019-02-20 DIAGNOSIS — M1811 Unilateral primary osteoarthritis of first carpometacarpal joint, right hand: Secondary | ICD-10-CM | POA: Diagnosis not present

## 2019-03-01 ENCOUNTER — Ambulatory Visit: Payer: Medicare HMO | Attending: Family Medicine | Admitting: Physical Therapy

## 2019-03-01 ENCOUNTER — Encounter: Payer: Self-pay | Admitting: Physical Therapy

## 2019-03-01 ENCOUNTER — Other Ambulatory Visit: Payer: Self-pay

## 2019-03-01 DIAGNOSIS — M5416 Radiculopathy, lumbar region: Secondary | ICD-10-CM | POA: Insufficient documentation

## 2019-03-01 DIAGNOSIS — R293 Abnormal posture: Secondary | ICD-10-CM | POA: Diagnosis not present

## 2019-03-01 DIAGNOSIS — G8929 Other chronic pain: Secondary | ICD-10-CM

## 2019-03-01 DIAGNOSIS — M5442 Lumbago with sciatica, left side: Secondary | ICD-10-CM | POA: Diagnosis not present

## 2019-03-01 DIAGNOSIS — M62830 Muscle spasm of back: Secondary | ICD-10-CM | POA: Insufficient documentation

## 2019-03-01 DIAGNOSIS — M5440 Lumbago with sciatica, unspecified side: Secondary | ICD-10-CM | POA: Diagnosis not present

## 2019-03-01 NOTE — Therapy (Signed)
[place], [place], [address] Phone: [phone]   Fax:  [phone]  Physical Therapy Treatment  Patient Details  Name: Beverly Shaw MRN: [id number] Date of Birth: 06-01-1950 Referring Provider (PT): Dr Hulan Saas    Encounter Date: 03/01/2019  PT End of Session - 03/01/19 1105    Visit Number  2    Number of Visits  12    Date for PT Re-Evaluation  03/29/19    Authorization Type  Aetna Medicare    PT Start Time  0845    PT Stop Time  0931    PT Time Calculation (min)  46 min    Activity Tolerance  Patient tolerated treatment well    Behavior During Therapy  [facility] for tasks assessed/performed       Past Medical History:  Diagnosis Date  . Atrophic kidney 11/18/2015  . Episodic low back pain   . GERD (gastroesophageal reflux disease)   . Heart murmur   . Migraines   . Osteopenia 10/15/2010  . Osteoporosis, unspecified 10/19/2012  . Positive TB test    Positive TB skin test    Past Surgical History:  Procedure Laterality Date  . ABDOMINAL HYSTERECTOMY  1996  . TUBAL LIGATION      There were no vitals filed for this visit.  Subjective Assessment - 03/01/19 1008    Subjective  No real change in her pain. The tennis ball helped but she continues to have pain when she sits. It was unlcear if she has done much of the other stretches.    How long can you sit comfortably?  can sit around and hour but it depends    How long can you stand comfortably?  improves pain    How long can you walk comfortably?  walking helpls    Diagnostic tests  X-ray: 2015: severe L4-L5 degeneration mild scoliosis    Currently in Pain?  Yes    Pain Score  5     Pain Location  Back    Pain Orientation  Left;Lower    Pain Descriptors / Indicators  Aching    Pain Type  Chronic pain    Pain Radiating Towards  less raidating pain    Pain Onset  More than a month ago    Aggravating Factors   Sitting    Pain Relieving  Factors  standing and walking    Effect of Pain on Daily Activities  difficulty                       [facility] Adult PT Treatment/Exercise - 03/01/19 0001      Lumbar Exercises: Stretches   Active Hamstring Stretch Limitations  hamstring stretch 3x20 sec hold encouraged to do at home.     Other Lumbar Stretch Exercise  prayer stretch 3x20 sec hold; lteral prayer for the left 2x20 second hold. Also reviewed sink stretch; no pain felt with either.       Lumbar Exercises: Seated   Other Seated Lumbar Exercises  reviewed abdominal breathing x5    Other Seated Lumbar Exercises  seated clamshell with ab breathing 2x10 mod cuing required for breathing       Manual Therapy   Manual Therapy  Joint mobilization;Soft tissue mobilization    Manual therapy comments  skilled palpation of triugger points     Joint Mobilization  PA glides form L1 -L5. Decreased PA throuout her spine  Soft tissue mobilization  to quadratus and lumbar paraspianls using IASTYM and manual trigger point release.        Trigger Point Dry Needling - 03/01/19 0001    Consent Given?  Yes    Education Handout Provided  Yes    Muscles Treated Back/Hip  Quadratus lumborum;Lumbar multifidi    Dry Needling Comments  40x100 for quaratus; 30x40 for lumbar multifidi     Lumbar multifidi Response  Twitch response elicited;Palpable increased muscle length    Quadratus Lumborum Response  Twitch response elicited           PT Education - 03/01/19 1104    Education Details  reviewed benefits and risk of trigger point dry needling. The improtance of strengthening.    Person(s) Educated  Patient    Methods  Explanation;Demonstration;Verbal cues;Tactile cues    Comprehension  Verbalized understanding;Returned demonstration;Verbal cues required;Tactile cues required       PT Short Term Goals - 03/01/19 1137      PT SHORT TERM GOAL #1   Title  Patient will increase lumbar flexion by 20 degrees    Baseline   independent    Time  3    Period  Weeks    Status  On-going      PT SHORT TERM GOAL #2   Title  Patient will report no pain radiating down her left leg    Baseline  not yet changed    Time  3    Period  Weeks    Status  On-going      PT SHORT TERM GOAL #3   Title  Patient will increase gross left lower extremity strength to 4+/5    Baseline  not tested    Time  3    Period  Weeks    Status  On-going    Target Date  03/08/19        PT Long Term Goals - 02/15/19 1049      PT LONG TERM GOAL #1   Title  [name] will increase lumbar flexion to 65 degrees in order to bend down and pick item off the ground without pain    Time  6    Period  Weeks    Status  New    Target Date  03/29/19      PT LONG TERM GOAL #2   Title  Patient will sit at work for 4 hours without increased lower back pain    Time  6    Period  Weeks    Status  New    Target Date  03/29/19      PT LONG TERM GOAL #3   Title  Patient will demonstrate a 31 % limitation on FOTO    Time  6    Period  Weeks    Status  New    Target Date  03/29/19            Plan - 03/01/19 1105    Clinical Impression Statement  Patient had a great tewitch resposewith all needles. She had apalpable increase in muscle length with the lumbar mulifidi and decreaseed sensativity to her QL She was sore after. therapy reviewed the use of tennis ball and stretching to decrease pain. Therapy will continue to advance into core exercises as tolerated.    Personal Factors and Comorbidities  Comorbidity 1;Comorbidity 2    Comorbidities  significant lumbar OA, osteoperosis,    Examination-Activity Limitations  Bend;Squat;Sit    Examination-Participation Restrictions  Driving;Other;Community Activity    Stability/Clinical Decision Making  Evolving/Moderate complexity    Clinical Decision Making  Moderate    Rehab Potential  Good    PT Frequency  2x / week    PT Treatment/Interventions  ADLs/Self Care Home  Management;Cryotherapy;Electrical Stimulation;Moist Heat;Ultrasound;Gait training;Functional mobility training;Therapeutic activities;Therapeutic exercise;Neuromuscular re-education;Patient/family education;Manual techniques;Passive range of motion;Dry needling;Taping    PT Next Visit Plan  consider TPDN ( patient given FAQ sheet); IASTYM and trigger point release to paraspinals; light PA glides if beneficial (osteoperosis); consder prayer stretch; consder LAD; bengin light core strengtheing. Only 30 degrees of loubar flexion noted on eval    PT Home Exercise Plan  given stretches for her desk. Tennis ball; hamstring stretch seated, pirifromis stretch bilateral    Consulted and Agree with Plan of Care  Patient       Patient will benefit from skilled therapeutic intervention in order to improve the following deficits and impairments:  Increased muscle spasms, Decreased range of motion, Decreased activity tolerance, Decreased strength, Pain  Visit Diagnosis: Radiculopathy, lumbar region  Chronic bilateral low back pain with left-sided sciatica  Muscle spasm of back     Problem List Patient Active Problem List   Diagnosis Date Noted  . Bee sting allergy 11/27/2018  . Right carpal tunnel syndrome 11/27/2018  . Rib pain on left side 11/27/2018  . Degenerative disc disease, lumbar 07/24/2017  . Sadness 07/24/2017  . Left lumbar radiculopathy 06/02/2017  . Costochondritis, acute 06/02/2017  . Atrophic kidney 11/18/2015  . Bilateral hand pain 11/18/2015  . Chronic lower back pain 11/18/2015  . Bilateral hearing loss 11/18/2015  . Recurrent boils 09/09/2015  . Dysuria 03/25/2015  . Microhematuria 11/14/2014  . Toe pain, right 07/08/2014  . Bunion of great toe of right foot 07/08/2014  . Other screening mammogram 10/22/2013  . Dog bite of finger 08/01/2013  . Swelling of third finger of right hand 01/07/2013  . Cervical radiculitis 11/29/2012  . Allergic rhinitis, cause unspecified  11/29/2012  . Encounter for well adult exam with abnormal findings 10/19/2012  . Impaired glucose tolerance 10/19/2012  . Alopecia areata 10/19/2012  . Osteoporosis 10/19/2012  . Hyperlipidemia 10/23/2011  . Bloating symptom 10/19/2011  . Onychomycosis 10/19/2011  . Urinary frequency 10/19/2011  . Positive TB test   . Episodic low back pain   . GERD (gastroesophageal reflux disease)   . Cluster headache, episodic     Carney Living  PT DPT  03/01/2019, 12:44 PM  [facility] [address] [place], [place], [address] Phone: [phone]   Fax:  [phone]  Name: Beverly Shaw MRN: [id number] Date of Birth: [birth year]

## 2019-03-06 ENCOUNTER — Ambulatory Visit: Payer: Medicare HMO | Admitting: Physical Therapy

## 2019-03-07 NOTE — Progress Notes (Signed)
[facility] [place], [place] [address] Phone: [phone] Subjective:   I Beverly Shaw am serving as a Education administrator for Dr. Hulan Saas.   CC: Low back pain follow-up  [id number]  Beverly Shaw is a 68 y.o. female coming in with complaint of polyarthralgia. Just came from PT. States she is a little sore. Dry needling today.  Patient was found to have severe degenerative disc disease as well as polyarthralgia.  Laboratory work-up was unremarkable.  Patient has been doing physical therapy, has tried tramadol for breakthrough but has not needed it on a regular basis.  Patient feels like making significant progress at that time.  Very happy with this.     Past Medical History:  Diagnosis Date  . Atrophic kidney 11/18/2015  . Episodic low back pain   . GERD (gastroesophageal reflux disease)   . Heart murmur   . Migraines   . Osteopenia 10/15/2010  . Osteoporosis, unspecified 10/19/2012  . Positive TB test    Positive TB skin test   Past Surgical History:  Procedure Laterality Date  . ABDOMINAL HYSTERECTOMY  1996  . TUBAL LIGATION     Social History   Socioeconomic History  . Marital status: Divorced    Spouse name: Not on file  . Number of children: 3  . Years of education: Not on file  . Highest education level: Not on file  Occupational History  . Occupation: ED Academic librarian [employer]  Social Needs  . Financial resource strain: Not on file  . Food insecurity    Worry: Not on file    Inability: Not on file  . Transportation needs    Medical: Not on file    Non-medical: Not on file  Tobacco Use  . Smoking status: Former Research scientist (life sciences)  . Smokeless tobacco: Never Used  Substance and Sexual Activity  . Alcohol use: Yes    Comment: once monthly  . Drug use: No  . Sexual activity: Not on file  Lifestyle  . Physical activity    Days per week: Not on file    Minutes per session: Not on file  . Stress: Not on file   Relationships  . Social Herbalist on phone: Not on file    Gets together: Not on file    Attends religious service: Not on file    Active member of club or organization: Not on file    Attends meetings of clubs or organizations: Not on file    Relationship status: Not on file  Other Topics Concern  . Not on file  Social History Narrative  . Not on file   Allergies  Allergen Reactions  . Yellow Jacket Venom [Bee Venom]    Family History  Problem Relation Age of Onset  . Heart disease Mother   . Heart disease Father   . Diabetes Other   . Colon cancer Neg Hx   . Pancreatic cancer Neg Hx   . Rectal cancer Neg Hx   . Stomach cancer Neg Hx   . Osteoporosis Neg Hx     Current Outpatient Medications (Endocrine & Metabolic):  .  alendronate (FOSAMAX) 70 MG tablet, TAKE 1 TABLET BY MOUTH EVERY 7 (SEVEN) DAYS. TAKE WITH A FULL GLASS OF WATER ON AN EMPTY STOMACH.  Current Outpatient Medications (Cardiovascular):  Marland Kitchen  EPINEPHrine 0.3 mg/0.3 mL IJ SOAJ injection, Inject 0.3 mLs (0.3 mg total) into the muscle as needed for anaphylaxis.  Current Outpatient Medications (Analgesics):  .  traMADol (ULTRAM) 50 MG tablet, Take 1 tablet (50 mg total) by mouth every 6 (six) hours as needed.   Current Outpatient Medications (Other):  .  calcium carbonate (OS-CAL) 600 MG TABS, Take 600 mg by mouth daily.   Marland Kitchen  gabapentin (NEURONTIN) 100 MG capsule, Take 1 capsule (100 mg total) by mouth 3 (three) times daily. Marland Kitchen  gabapentin (NEURONTIN) 100 MG capsule, Take 2 capsules (200 mg total) by mouth at bedtime. .  Multiple Vitamin (MULTIVITAMIN) capsule, Take 1 capsule by mouth daily.      Past medical history, social, surgical and family history all reviewed in electronic medical record.  No pertanent information unless stated regarding to the chief complaint.   Review of Systems:  No headache, visual changes, nausea, vomiting, diarrhea, constipation, dizziness, abdominal pain, skin  rash, fevers, chills, night sweats, weight loss, swollen lymph nodes, body aches, joint swelling,  chest pain, shortness of breath, mood changes.  Positive muscle aches  Objective  Blood pressure 140/90, pulse 62, height 5\' 2"  (1.575 m), weight 131 lb (59.4 kg), SpO2 98 %.    General: No apparent distress alert and oriented x3 mood and affect normal, dressed appropriately.  HEENT: Pupils equal, extraocular movements intact  Respiratory: Patient's speak in full sentences and does not appear short of breath  Cardiovascular: No lower extremity edema, non tender, no erythema  Skin: Warm dry intact with no signs of infection or rash on extremities or on axial skeleton.  Abdomen: Soft nontender  Neuro: Cranial nerves II through XII are intact, neurovascularly intact in all extremities with 2+ DTRs and 2+ pulses.  Lymph: No lymphadenopathy of posterior or anterior cervical chain or axillae bilaterally.  Gait normal with good balance and coordination.  MSK:  Non tender with full range of motion and good stability and symmetric strength and tone of shoulders, elbows, wrist, hip, knee and ankles bilaterally.  Back exam has some mild loss of lordosis.  Some tightness noted in the paraspinal musculature.  Still some limited range of motion with extension of the back. Positive strength in the lower extremities.  Deep tendon reflexes intact.   Impression and Recommendations:      The above documentation has been reviewed and is accurate and complete Lyndal Pulley, DO       Note: This dictation was prepared with Dragon dictation along with smaller phrase technology. Any transcriptional errors that result from this process are unintentional.

## 2019-03-08 ENCOUNTER — Encounter: Payer: Self-pay | Admitting: Family Medicine

## 2019-03-08 ENCOUNTER — Ambulatory Visit (INDEPENDENT_AMBULATORY_CARE_PROVIDER_SITE_OTHER): Payer: Medicare HMO | Admitting: Family Medicine

## 2019-03-08 ENCOUNTER — Encounter: Payer: Self-pay | Admitting: Physical Therapy

## 2019-03-08 ENCOUNTER — Other Ambulatory Visit: Payer: Self-pay

## 2019-03-08 ENCOUNTER — Ambulatory Visit: Payer: Medicare HMO | Admitting: Physical Therapy

## 2019-03-08 DIAGNOSIS — M62830 Muscle spasm of back: Secondary | ICD-10-CM

## 2019-03-08 DIAGNOSIS — M5440 Lumbago with sciatica, unspecified side: Secondary | ICD-10-CM | POA: Diagnosis not present

## 2019-03-08 DIAGNOSIS — M5416 Radiculopathy, lumbar region: Secondary | ICD-10-CM

## 2019-03-08 DIAGNOSIS — G8929 Other chronic pain: Secondary | ICD-10-CM

## 2019-03-08 DIAGNOSIS — M5442 Lumbago with sciatica, left side: Secondary | ICD-10-CM | POA: Diagnosis not present

## 2019-03-08 DIAGNOSIS — R293 Abnormal posture: Secondary | ICD-10-CM | POA: Diagnosis not present

## 2019-03-08 DIAGNOSIS — M5136 Other intervertebral disc degeneration, lumbar region: Secondary | ICD-10-CM

## 2019-03-08 DIAGNOSIS — M51369 Other intervertebral disc degeneration, lumbar region without mention of lumbar back pain or lower extremity pain: Secondary | ICD-10-CM

## 2019-03-08 NOTE — Patient Instructions (Signed)
Continue vitamins Do PT as much as you want See me in 2 months

## 2019-03-08 NOTE — Assessment & Plan Note (Signed)
Severe on imaging.  Patient is making progress with conservative therapy.  50% better with physical therapy including dry needling.  Discussed icing regimen, home exercise, what activities to do which wants to avoid.  Patient will increase activity follow-up with me again in 4 to 8 weeks

## 2019-03-08 NOTE — Therapy (Signed)
[place], [place], [address] Phone: [phone]   Fax:  [phone]  Physical Therapy Treatment  Patient Details  Name: Beverly Shaw MRN: [id number] Date of Birth: [birth year] Referring Provider (PT): Dr Hulan Saas    Encounter Date: 03/08/2019  PT End of Session - 03/08/19 0840    Visit Number  3    Number of Visits  12    Date for PT Re-Evaluation  03/29/19    Authorization Type  [employer] Medicare    PT Start Time  0800    PT Stop Time  0840    PT Time Calculation (min)  40 min    Activity Tolerance  Patient tolerated treatment well    Behavior During Therapy  [facility] for tasks assessed/performed       Past Medical History:  Diagnosis Date  . Atrophic kidney 11/18/2015  . Episodic low back pain   . GERD (gastroesophageal reflux disease)   . Heart murmur   . Migraines   . Osteopenia 10/15/2010  . Osteoporosis, unspecified 10/19/2012  . Positive TB test    Positive TB skin test    Past Surgical History:  Procedure Laterality Date  . ABDOMINAL HYSTERECTOMY  1996  . TUBAL LIGATION      There were no vitals filed for this visit.  Subjective Assessment - 03/08/19 0805    Subjective  Needling was helpful for a while but it seems to have returned. my back hurts when I sit all day, feels better at home when I am moving. Not sure that work would go for standing desk.    Patient Stated Goals  improve mobility    Currently in Pain?  Yes    Pain Score  5     Pain Location  Back    Pain Orientation  Lower    Pain Descriptors / Indicators  --   hurts                      [facility] Adult PT Treatment/Exercise - 03/08/19 0001      Exercises   Exercises  Lumbar      Lumbar Exercises: Stretches   Lower Trunk Rotation  3 reps    Quadruped Mid Back Stretch Limitations  child pose      Lumbar Exercises: Aerobic   Nustep  6 min L5 UE & LE      Lumbar Exercises: Supine   Other Supine  Lumbar Exercises  posterior pelvic tilt      Manual Therapy   Manual therapy comments  skilled palpation and monitoring during TPDN    Soft tissue mobilization  bil lumbar paraspinals, Lt QL & glut max       Trigger Point Dry Needling - 03/08/19 0001    Muscles Treated Back/Hip  Gluteus maximus    Gluteus Maximus Response  Twitch response elicited;Palpable increased muscle length   Left   Lumbar multifidi Response  Twitch response elicited;Palpable increased muscle length   LT L3-L5   Quadratus Lumborum Response  Twitch response elicited;Palpable increased muscle length   Lt            PT Short Term Goals - 03/01/19 1137      PT SHORT TERM GOAL #1   Title  Patient will increase lumbar flexion by 20 degrees    Baseline  independent    Time  3    Period  Weeks    Status  On-going  PT SHORT TERM GOAL #2   Title  Patient will report no pain radiating down her left leg    Baseline  not yet changed    Time  3    Period  Weeks    Status  On-going      PT SHORT TERM GOAL #3   Title  Patient will increase gross left lower extremity strength to 4+/5    Baseline  not tested    Time  3    Period  Weeks    Status  On-going    Target Date  03/08/19        PT Long Term Goals - 02/15/19 1049      PT LONG TERM GOAL #1   Title  Beverly Shaw will increase lumbar flexion to 65 degrees in order to bend down and pick item off the ground without pain    Time  6    Period  Weeks    Status  New    Target Date  03/29/19      PT LONG TERM GOAL #2   Title  Patient will sit at work for 4 hours without increased lower back pain    Time  6    Period  Weeks    Status  New    Target Date  03/29/19      PT LONG TERM GOAL #3   Title  Patient will demonstrate a 31 % limitation on FOTO    Time  6    Period  Weeks    Status  New    Target Date  03/29/19            Plan - 03/08/19 0807    Clinical Impression Statement  Significant difficulty performing pelvic tilt. Repeated  DN due to positive outcome last time. Asked her to do 3 basic stretches in supine BID, especially when she is tight, before doing heat.    PT Treatment/Interventions  ADLs/Self Care Home Management;Cryotherapy;Electrical Stimulation;Moist Heat;Ultrasound;Gait training;Functional mobility training;Therapeutic activities;Therapeutic exercise;Neuromuscular re-education;Patient/family education;Manual techniques;Passive range of motion;Dry needling;Taping    PT Next Visit Plan  TPDN PRN, review pelvic tilt & progress core strength    PT Home Exercise Plan  given stretches for her desk. Tennis ball; hamstring stretch seated, pirifromis stretch bilateral, stationary bike 3days/week 10+ min, child pose, LTR, SKTC, post pelvic tilt    Consulted and Agree with Plan of Care  Patient       Patient will benefit from skilled therapeutic intervention in order to improve the following deficits and impairments:  Increased muscle spasms, Decreased range of motion, Decreased activity tolerance, Decreased strength, Pain  Visit Diagnosis: Radiculopathy, lumbar region  Chronic bilateral low back pain with left-sided sciatica  Muscle spasm of back  Chronic bilateral low back pain with sciatica, sciatica laterality unspecified  Abnormal posture     Problem List Patient Active Problem List   Diagnosis Date Noted  . Bee sting allergy 11/27/2018  . Right carpal tunnel syndrome 11/27/2018  . Rib pain on left side 11/27/2018  . Degenerative disc disease, lumbar 07/24/2017  . Sadness 07/24/2017  . Left lumbar radiculopathy 06/02/2017  . Costochondritis, acute 06/02/2017  . Atrophic kidney 11/18/2015  . Bilateral hand pain 11/18/2015  . Chronic lower back pain 11/18/2015  . Bilateral hearing loss 11/18/2015  . Recurrent boils 09/09/2015  . Dysuria 03/25/2015  . Microhematuria 11/14/2014  . Toe pain, right 07/08/2014  . Bunion of great toe of right foot 07/08/2014  . Other screening  mammogram 10/22/2013   . Dog bite of finger 08/01/2013  . Swelling of third finger of right hand 01/07/2013  . Cervical radiculitis 11/29/2012  . Allergic rhinitis, cause unspecified 11/29/2012  . Encounter for well adult exam with abnormal findings 10/19/2012  . Impaired glucose tolerance 10/19/2012  . Alopecia areata 10/19/2012  . Osteoporosis 10/19/2012  . Hyperlipidemia 10/23/2011  . Bloating symptom 10/19/2011  . Onychomycosis 10/19/2011  . Urinary frequency 10/19/2011  . Positive TB test   . Episodic low back pain   . GERD (gastroesophageal reflux disease)   . Cluster headache, episodic    Beverly Shaw PT, DPT 03/08/19 8:47 AM   [facility] [address] [place], [place], [address] Phone: [phone]   Fax:  [phone]  Name: Beverly Shaw MRN: [id number] Date of Birth: [birth year]

## 2019-03-12 ENCOUNTER — Other Ambulatory Visit: Payer: Self-pay

## 2019-03-12 ENCOUNTER — Ambulatory Visit: Payer: Medicare HMO | Admitting: Physical Therapy

## 2019-03-12 ENCOUNTER — Encounter: Payer: Self-pay | Admitting: Physical Therapy

## 2019-03-12 DIAGNOSIS — M5442 Lumbago with sciatica, left side: Secondary | ICD-10-CM | POA: Diagnosis not present

## 2019-03-12 DIAGNOSIS — M5440 Lumbago with sciatica, unspecified side: Secondary | ICD-10-CM | POA: Diagnosis not present

## 2019-03-12 DIAGNOSIS — R293 Abnormal posture: Secondary | ICD-10-CM | POA: Diagnosis not present

## 2019-03-12 DIAGNOSIS — M5416 Radiculopathy, lumbar region: Secondary | ICD-10-CM | POA: Diagnosis not present

## 2019-03-12 DIAGNOSIS — G8929 Other chronic pain: Secondary | ICD-10-CM

## 2019-03-12 DIAGNOSIS — M62830 Muscle spasm of back: Secondary | ICD-10-CM

## 2019-03-12 NOTE — Therapy (Signed)
[place], [place], [address] Phone: [phone]   Fax:  [phone]  Physical Therapy Treatment  Patient Details  Name: Beverly Shaw MRN: [id number] Date of Birth: [birth year] Referring Provider (PT): Dr Hulan Saas    Encounter Date: 03/12/2019  PT End of Session - 03/12/19 1457    Visit Number  4    Number of Visits  12    Date for PT Re-Evaluation  03/29/19    Authorization Type  [employer]na Medicare    PT Start Time  [id number]    PT Stop Time  1541    PT Time Calculation (min)  44 min    Activity Tolerance  Patient tolerated treatment well    Behavior During Therapy  [facility] for tasks assessed/performed       Past Medical History:  Diagnosis Date  . Atrophic kidney 11/18/2015  . Episodic low back pain   . GERD (gastroesophageal reflux disease)   . Heart murmur   . Migraines   . Osteopenia 10/15/2010  . Osteoporosis, unspecified 10/19/2012  . Positive TB test    Positive TB skin test    Past Surgical History:  Procedure Laterality Date  . ABDOMINAL HYSTERECTOMY  1996  . TUBAL LIGATION      There were no vitals filed for this visit.  Subjective Assessment - 03/12/19 1459    Subjective  Felt better after appointment until I got home. I got a HA and didn't want to do anything Friday or Saturday. The pain has moved into buttock. I have been doing stretches but could I be overdoing them?    Patient Stated Goals  improve mobility    Currently in Pain?  Yes    Pain Score  7     Pain Location  Back    Pain Orientation  Lower    Pain Descriptors / Indicators  Sore                       [facility] Adult PT Treatment/Exercise - 03/12/19 0001      Lumbar Exercises: Stretches   Quadruped Mid Back Stretch Limitations  child pose    Figure 4 Stretch Limitations  hooklying pull across      Lumbar Exercises: Aerobic   Recumbent Bike  5 min L2      Lumbar Exercises: Supine   Bridge  15 reps      Lumbar Exercises: Sidelying   Clam  Both;15 reps      Lumbar Exercises: Quadruped   Other Quadruped Lumbar Exercises  hip ext knee flexed      Manual Therapy   Soft tissue mobilization  bilateral piriformis               PT Short Term Goals - 03/01/19 1137      PT SHORT TERM GOAL #1   Title  Patient will increase lumbar flexion by 20 degrees    Baseline  independent    Time  3    Period  Weeks    Status  On-going      PT SHORT TERM GOAL #2   Title  Patient will report no pain radiating down her left leg    Baseline  not yet changed    Time  3    Period  Weeks    Status  On-going      PT SHORT TERM GOAL #3   Title  Patient will increase gross left  lower extremity strength to 4+/5    Baseline  not tested    Time  3    Period  Weeks    Status  On-going    Target Date  03/08/19        PT Long Term Goals - 02/15/19 1049      PT LONG TERM GOAL #1   Title  Beverly Shaw will increase lumbar flexion to 65 degrees in order to bend down and pick item off the ground without pain    Time  6    Period  Weeks    Status  New    Target Date  03/29/19      PT LONG TERM GOAL #2   Title  Patient will sit at work for 4 hours without increased lower back pain    Time  6    Period  Weeks    Status  New    Target Date  03/29/19      PT LONG TERM GOAL #3   Title  Patient will demonstrate a 31 % limitation on FOTO    Time  6    Period  Weeks    Status  New    Target Date  03/29/19            Plan - 03/12/19 1541    Clinical Impression Statement  Concordant pain found in bil piriformis but Rt worse than Lt. Used soft tissue release techniques rather than DN which created soreness as expected. Significant challenge in quadruped hip extension and we discussed altering number of reps in each set rather than feeling like she is really struggling.    PT Treatment/Interventions  ADLs/Self Care Home Management;Cryotherapy;Electrical Stimulation;Moist Heat;Ultrasound;Gait  training;Functional mobility training;Therapeutic activities;Therapeutic exercise;Neuromuscular re-education;Patient/family education;Manual techniques;Passive range of motion;Dry needling;Taping    PT Next Visit Plan  continue glut max strength, update goals    PT Home Exercise Plan  given stretches for her desk. Tennis ball; hamstring stretch seated, pirifromis stretch bilateral, stationary bike 3days/week 10+ min, child pose, LTR, SKTC, post pelvic tilt; bridge, clam, qped hip ext, child pose, standing gastroc stretch    Consulted and Agree with Plan of Care  Patient       Patient will benefit from skilled therapeutic intervention in order to improve the following deficits and impairments:  Increased muscle spasms, Decreased range of motion, Decreased activity tolerance, Decreased strength, Pain  Visit Diagnosis: Radiculopathy, lumbar region  Chronic bilateral low back pain with left-sided sciatica  Muscle spasm of back  Chronic bilateral low back pain with sciatica, sciatica laterality unspecified  Abnormal posture     Problem List Patient Active Problem List   Diagnosis Date Noted  . Bee sting allergy 11/27/2018  . Right carpal tunnel syndrome 11/27/2018  . Rib pain on left side 11/27/2018  . Degenerative disc disease, lumbar 07/24/2017  . Sadness 07/24/2017  . Left lumbar radiculopathy 06/02/2017  . Costochondritis, acute 06/02/2017  . Atrophic kidney 11/18/2015  . Bilateral hand pain 11/18/2015  . Chronic lower back pain 11/18/2015  . Bilateral hearing loss 11/18/2015  . Recurrent boils 09/09/2015  . Dysuria 03/25/2015  . Microhematuria 11/14/2014  . Toe pain, right 07/08/2014  . Bunion of great toe of right foot 07/08/2014  . Other screening mammogram 10/22/2013  . Dog bite of finger 08/01/2013  . Swelling of third finger of right hand 01/07/2013  . Cervical radiculitis 11/29/2012  . Allergic rhinitis, cause unspecified 11/29/2012  . Encounter for well adult  exam with  abnormal findings 10/19/2012  . Impaired glucose tolerance 10/19/2012  . Alopecia areata 10/19/2012  . Osteoporosis 10/19/2012  . Hyperlipidemia 10/23/2011  . Bloating symptom 10/19/2011  . Onychomycosis 10/19/2011  . Urinary frequency 10/19/2011  . Positive TB test   . Episodic low back pain   . GERD (gastroesophageal reflux disease)   . Cluster headache, episodic     Judah Carchi C. Kendricks Reap PT, DPT 03/12/19 3:46 PM   [facility] [address] [place], [place], [address] Phone: [phone]   Fax:  [phone]  Name: Beverly Shaw MRN: [id number] Date of Birth: April 02, 1951

## 2019-03-13 ENCOUNTER — Ambulatory Visit: Payer: Medicare HMO | Admitting: Physical Therapy

## 2019-03-13 ENCOUNTER — Encounter: Payer: Self-pay | Admitting: Physical Therapy

## 2019-03-13 DIAGNOSIS — R293 Abnormal posture: Secondary | ICD-10-CM | POA: Diagnosis not present

## 2019-03-13 DIAGNOSIS — M5440 Lumbago with sciatica, unspecified side: Secondary | ICD-10-CM | POA: Diagnosis not present

## 2019-03-13 DIAGNOSIS — M5442 Lumbago with sciatica, left side: Secondary | ICD-10-CM | POA: Diagnosis not present

## 2019-03-13 DIAGNOSIS — G8929 Other chronic pain: Secondary | ICD-10-CM | POA: Diagnosis not present

## 2019-03-13 DIAGNOSIS — M62830 Muscle spasm of back: Secondary | ICD-10-CM | POA: Diagnosis not present

## 2019-03-13 DIAGNOSIS — M5416 Radiculopathy, lumbar region: Secondary | ICD-10-CM

## 2019-03-14 NOTE — Therapy (Signed)
[place], [place], [address] Phone: [phone]   Fax:  [phone]  Physical Therapy Treatment  Patient Details  Name: Beverly Shaw MRN: [id number] Date of Birth: [birth year] Referring Provider (PT): Dr Hulan Saas    Encounter Date: 03/13/2019  PT End of Session - 03/14/19 1536    Visit Number  5    Number of Visits  12    Date for PT Re-Evaluation  03/29/19    Authorization Type  [employer]na Medicare    PT Start Time  [id number]    PT Stop Time  1629    PT Time Calculation (min)  44 min    Activity Tolerance  Patient tolerated treatment well    Behavior During Therapy  [facility] for tasks assessed/performed       Past Medical History:  Diagnosis Date  . Atrophic kidney 11/18/2015  . Episodic low back pain   . GERD (gastroesophageal reflux disease)   . Heart murmur   . Migraines   . Osteopenia 10/15/2010  . Osteoporosis, unspecified 10/19/2012  . Positive TB test    Positive TB skin test    Past Surgical History:  Procedure Laterality Date  . ABDOMINAL HYSTERECTOMY  1996  . TUBAL LIGATION      There were no vitals filed for this visit.  Subjective Assessment - 03/13/19 1549    Subjective  Patient feels like she is better then she was yesterday. She did not do a lot of ssitting at [employer]. She is not having throbbing pain in her back.    How long can you sit comfortably?  can sit around and hour but it depends    How long can you stand comfortably?  improves pain    How long can you walk comfortably?  walking helpls    Diagnostic tests  X-ray: 2015: severe L4-L5 degeneration mild scoliosis    Patient Stated Goals  improve mobility    Currently in Pain?  Yes    Pain Score  4     Pain Location  Axilla    Pain Orientation  Lower    Pain Descriptors / Indicators  Aching    Pain Type  Chronic pain    Pain Onset  More than a month ago    Aggravating Factors   sitting    Pain Relieving Factors  standing and  walking                       [facility] Adult PT Treatment/Exercise - 03/14/19 0001      Lumbar Exercises: Stretches   Active Hamstring Stretch Limitations  hamstring stretch 3x20 sec hold encouraged to do at home.     Figure 4 Stretch Limitations  hooklying pull across      Lumbar Exercises: Aerobic   Recumbent Bike  5 min L2      Lumbar Exercises: Supine   Bridge  15 reps    Other Supine Lumbar Exercises  clmashell 2x10 red; supine march 2x10       Manual Therapy   Manual Therapy  Manual Traction    Manual therapy comments  skilled palpation and monitoring during TPDN    Joint Mobilization  PA glides form L1 -L5. Decreased PA throuout her spine     Soft tissue mobilization  ISASTYM to left lumbar paraspinals and upper gluteals     Manual Traction  LAD 5x20 sec hold bilateral with grade II and III ocilations  Trigger Point Dry Needling - 03/14/19 0001    Consent Given?  Yes    Education Handout Provided  Yes    Muscles Treated Back/Hip  Gluteus maximus    Lumbar multifidi Response  Twitch response elicited;Palpable increased muscle length           PT Education - 03/14/19 [id number]    Education Details  reviewed benefits and trisk of TPDN    Person(s) Educated  Patient    Methods  Explanation;Demonstration;Tactile cues;Verbal cues    Comprehension  Verbalized understanding;Verbal cues required;Returned demonstration;Tactile cues required       PT Short Term Goals - 03/14/19 1541      PT SHORT TERM GOAL #1   Title  Patient will increase lumbar flexion by 20 degrees    Baseline  independent    Time  3    Period  Weeks    Status  Achieved      PT SHORT TERM GOAL #2   Title  Patient will report no pain radiating down her left leg    Baseline  not yet changed    Time  3    Period  Weeks    Status  On-going    Target Date  03/08/19      PT SHORT TERM GOAL #3   Title  Patient will increase gross left lower extremity strength to 4+/5    Baseline   not tested    Time  3    Period  Weeks    Status  On-going        PT Long Term Goals - 02/15/19 1049      PT LONG TERM GOAL #1   Title  [name] will increase lumbar flexion to 65 degrees in order to bend down and pick item off the ground without pain    Time  6    Period  Weeks    Status  New    Target Date  03/29/19      PT LONG TERM GOAL #2   Title  Patient will sit at work for 4 hours without increased lower back pain    Time  6    Period  Weeks    Status  New    Target Date  03/29/19      PT LONG TERM GOAL #3   Title  Patient will demonstrate a 31 % limitation on FOTO    Time  6    Period  Weeks    Status  New    Target Date  03/29/19            Plan - 03/14/19 1538    Clinical Impression Statement  Therapy needled just the patients lumbar multifidi this session. She reported soreness that lasted 4-5 days after her last session of needling and was hesitant to do to many spots. Therapy hit 3 spots on the left and got a great twithc response. Therapy focused on manual therapy to improve spinal mobility and reduce muslce spasiming. Therapy reviewed exercises at home.    Personal Factors and Comorbidities  Comorbidity 1;Comorbidity 2    Comorbidities  significant lumbar OA, osteoperosis,    Examination-Activity Limitations  Bend;Squat;Sit    Examination-Participation Restrictions  Driving;Other;Community Activity    Stability/Clinical Decision Making  Evolving/Moderate complexity    Clinical Decision Making  Moderate    Rehab Potential  Good    PT Frequency  2x / week    PT Duration  6 weeks  PT Treatment/Interventions  ADLs/Self Care Home Management;Cryotherapy;Electrical Stimulation;Moist Heat;Ultrasound;Gait training;Functional mobility training;Therapeutic activities;Therapeutic exercise;Neuromuscular re-education;Patient/family education;Manual techniques;Passive range of motion;Dry needling;Taping    PT Next Visit Plan  continue with manual therapy to reduce  left side spasming; review respose to TPDN.    PT Home Exercise Plan  given stretches for her desk. Tennis ball; hamstring stretch seated, pirifromis stretch bilateral, stationary bike 3days/week 10+ min, child pose, LTR, SKTC, post pelvic tilt; bridge, clam, qped hip ext, child pose, standing gastroc stretch    Consulted and Agree with Plan of Care  Patient       Patient will benefit from skilled therapeutic intervention in order to improve the following deficits and impairments:  Increased muscle spasms, Decreased range of motion, Decreased activity tolerance, Decreased strength, Pain  Visit Diagnosis: Radiculopathy, lumbar region  Chronic bilateral low back pain with left-sided sciatica  Muscle spasm of back  Chronic bilateral low back pain with sciatica, sciatica laterality unspecified  Abnormal posture     Problem List Patient Active Problem List   Diagnosis Date Noted  . Bee sting allergy 11/27/2018  . Right carpal tunnel syndrome 11/27/2018  . Rib pain on left side 11/27/2018  . Degenerative disc disease, lumbar 07/24/2017  . Sadness 07/24/2017  . Left lumbar radiculopathy 06/02/2017  . Costochondritis, acute 06/02/2017  . Atrophic kidney 11/18/2015  . Bilateral hand pain 11/18/2015  . Chronic lower back pain 11/18/2015  . Bilateral hearing loss 11/18/2015  . Recurrent boils 09/09/2015  . Dysuria 03/25/2015  . Microhematuria 11/14/2014  . Toe pain, right 07/08/2014  . Bunion of great toe of right foot 07/08/2014  . Other screening mammogram 10/22/2013  . Dog bite of finger 08/01/2013  . Swelling of third finger of right hand 01/07/2013  . Cervical radiculitis 11/29/2012  . Allergic rhinitis, cause unspecified 11/29/2012  . Encounter for well adult exam with abnormal findings 10/19/2012  . Impaired glucose tolerance 10/19/2012  . Alopecia areata 10/19/2012  . Osteoporosis 10/19/2012  . Hyperlipidemia 10/23/2011  . Bloating symptom 10/19/2011  . Onychomycosis  10/19/2011  . Urinary frequency 10/19/2011  . Positive TB test   . Episodic low back pain   . GERD (gastroesophageal reflux disease)   . Cluster headache, episodic     Carney Living 03/14/2019, 3:46 PM  [facility] [address] [place], [place], [address] Phone: [phone]   Fax:  [phone]  Name: GAYLIA BRINGMAN MRN: [id number] Date of Birth: 21-May-1951

## 2019-03-19 ENCOUNTER — Ambulatory Visit: Payer: Medicare HMO | Admitting: Physical Therapy

## 2019-03-19 ENCOUNTER — Other Ambulatory Visit: Payer: Self-pay

## 2019-03-19 ENCOUNTER — Encounter: Payer: Self-pay | Admitting: Physical Therapy

## 2019-03-19 DIAGNOSIS — R293 Abnormal posture: Secondary | ICD-10-CM

## 2019-03-19 DIAGNOSIS — M62830 Muscle spasm of back: Secondary | ICD-10-CM

## 2019-03-19 DIAGNOSIS — M5416 Radiculopathy, lumbar region: Secondary | ICD-10-CM | POA: Diagnosis not present

## 2019-03-19 DIAGNOSIS — M5442 Lumbago with sciatica, left side: Secondary | ICD-10-CM

## 2019-03-19 DIAGNOSIS — M5440 Lumbago with sciatica, unspecified side: Secondary | ICD-10-CM | POA: Diagnosis not present

## 2019-03-19 DIAGNOSIS — G8929 Other chronic pain: Secondary | ICD-10-CM

## 2019-03-20 ENCOUNTER — Ambulatory Visit: Payer: Medicare HMO | Admitting: Physical Therapy

## 2019-03-20 ENCOUNTER — Encounter: Payer: Self-pay | Admitting: Physical Therapy

## 2019-03-20 DIAGNOSIS — M5440 Lumbago with sciatica, unspecified side: Secondary | ICD-10-CM | POA: Diagnosis not present

## 2019-03-20 DIAGNOSIS — G8929 Other chronic pain: Secondary | ICD-10-CM | POA: Diagnosis not present

## 2019-03-20 DIAGNOSIS — M5442 Lumbago with sciatica, left side: Secondary | ICD-10-CM

## 2019-03-20 DIAGNOSIS — M5416 Radiculopathy, lumbar region: Secondary | ICD-10-CM | POA: Diagnosis not present

## 2019-03-20 DIAGNOSIS — M62830 Muscle spasm of back: Secondary | ICD-10-CM | POA: Diagnosis not present

## 2019-03-20 DIAGNOSIS — R293 Abnormal posture: Secondary | ICD-10-CM | POA: Diagnosis not present

## 2019-03-20 NOTE — Therapy (Signed)
[place], [place], [address] Phone: [phone]   Fax:  [phone]  Physical Therapy Treatment  Patient Details  Name: Beverly Shaw MRN: [id number] Date of Birth: 1950/06/05 Referring Provider (PT): Dr Hulan Saas    Encounter Date: 03/19/2019  PT End of Session - 03/20/19 1216    Visit Number  6    Number of Visits  12    Date for PT Re-Evaluation  03/29/19    Authorization Type  [employer]    PT Start Time  [id number]    PT Stop Time  1627    PT Time Calculation (min)  42 min    Activity Tolerance  Patient tolerated treatment well    Behavior During Therapy  Beverly Shaw for tasks assessed/performed       Past Medical History:  Diagnosis Date  . Atrophic kidney 11/18/2015  . Episodic low back pain   . GERD (gastroesophageal reflux disease)   . Heart murmur   . Migraines   . Osteopenia 10/15/2010  . Osteoporosis, unspecified 10/19/2012  . Positive TB test    Positive TB skin test    Past Surgical History:  Procedure Laterality Date  . ABDOMINAL HYSTERECTOMY  1996  . TUBAL LIGATION      There were no vitals filed for this visit.  Subjective Assessment - 03/19/19 1609    Subjective  Patients back continues to improve. She is having less pain sitting. She had very little pain at all today. She has been working on her stretches and exercises at home.    How long can you sit comfortably?  can sit around and hour but it depends    How long can you stand comfortably?  improves pain    How long can you walk comfortably?  walking helpls    Diagnostic tests  X-ray: 2015: severe L4-L5 degeneration mild scoliosis    Patient Stated Goals  improve mobility    Currently in Pain?  No/denies    Pain Descriptors / Indicators  Aching    Pain Type  Chronic pain                       [facility] Adult PT Treatment/Exercise - 03/20/19 0001      Lumbar Exercises: Stretches   Active Hamstring Stretch  Limitations  hamstring stretch 3x20 sec hold encouraged to do at home.     Figure 4 Stretch Limitations  hooklying pull across      Lumbar Exercises: Aerobic   Recumbent Bike  5 min L2      Lumbar Exercises: Standing   Other Standing Lumbar Exercises  scap retraction 2x10 red with abvdominal breathing; shoulder extension 2x10 red  with abdominal breathing       Lumbar Exercises: Supine   Bent Knee Raise Limitations  supine march 2x10;     Bridge  15 reps    Other Supine Lumbar Exercises  clamshell 2x10 red; supine march 2x10       Manual Therapy   Manual Therapy  Manual Traction    Manual therapy comments  skilled palpation and monitoring during TPDN    Joint Mobilization  PA glides form L1 -L5. Decreased PA throuout her spine     Soft tissue mobilization  ISASTYM to left lumbar paraspinals and upper gluteals     Manual Traction  LAD 5x20 sec hold bilateral with grade II and III ocilations  Trigger Point Dry Needling - 03/20/19 0001    Consent Given?  Yes    Education Handout Provided  Yes    Other Dry Needling  25x60 to right multifidis     Lumbar multifidi Response  Twitch response elicited;Palpable increased muscle length           PT Education - 03/20/19 1216    Education Details  updated HEP    Person(s) Educated  Patient    Methods  Explanation;Demonstration;Tactile cues;Verbal cues    Comprehension  Verbalized understanding;Returned demonstration;Verbal cues required;Tactile cues required       PT Short Term Goals - 03/20/19 1219      PT SHORT TERM GOAL #1   Title  Patient will increase lumbar flexion by 20 degrees    Baseline  per visual inspection improved    Time  3    Period  Weeks    Status  Achieved    Target Date  03/08/19      PT SHORT TERM GOAL #2   Title  Patient will report no pain radiating down her left leg    Baseline  not yet changed    Time  3    Period  Weeks    Status  Achieved    Target Date  03/08/19      PT SHORT TERM GOAL  #3   Title  Patient will increase gross left lower extremity strength to 4+/5    Baseline  not tested    Time  3    Period  Weeks    Status  Achieved    Target Date  03/08/19        PT Long Term Goals - 02/15/19 1049      PT LONG TERM GOAL #1   Title  [name] will increase lumbar flexion to 65 degrees in order to bend down and pick item off the ground without pain    Time  6    Period  Weeks    Status  New    Target Date  03/29/19      PT LONG TERM GOAL #2   Title  Patient will sit at work for 4 hours without increased lower back pain    Time  6    Period  Weeks    Status  New    Target Date  03/29/19      PT LONG TERM GOAL #3   Title  Patient will demonstrate a 31 % limitation on FOTO    Time  6    Period  Weeks    Status  New    Target Date  03/29/19            Plan - 03/20/19 1217    Clinical Impression Statement  Patient is making great progress. Her paraspinals and quadratus spasming have reduced. She has been working on her stretching and exercises. She has been sitting better at work. She will continue for 2 more weeks 1x a week.    Personal Factors and Comorbidities  Comorbidity 1;Comorbidity 2    Comorbidities  significant lumbar OA, osteoperosis,    Examination-Participation Restrictions  Driving;Other;Community Activity    Stability/Clinical Decision Making  Evolving/Moderate complexity    Clinical Decision Making  Moderate    Rehab Potential  Good    PT Frequency  2x / week    PT Duration  6 weeks    PT Treatment/Interventions  ADLs/Self Care Home Management;Cryotherapy;Electrical Stimulation;Moist Heat;Ultrasound;Gait training;Functional mobility training;Therapeutic activities;Therapeutic exercise;Neuromuscular  re-education;Patient/family education;Manual techniques;Passive range of motion;Dry needling;Taping    PT Next Visit Plan  continue with manual therapy to reduce left side spasming; review respose to TPDN.    PT Home Exercise Plan  given  stretches for her desk. Tennis ball; hamstring stretch seated, pirifromis stretch bilateral, stationary bike 3days/week 10+ min, child pose, LTR, SKTC, post pelvic tilt; bridge, clam, qped hip ext, child pose, standing gastroc stretch    Consulted and Agree with Plan of Care  Patient       Patient will benefit from skilled therapeutic intervention in order to improve the following deficits and impairments:  Increased muscle spasms, Decreased range of motion, Decreased activity tolerance, Decreased strength, Pain  Visit Diagnosis: Radiculopathy, lumbar region  Chronic bilateral low back pain with left-sided sciatica  Muscle spasm of back  Chronic bilateral low back pain with sciatica, sciatica laterality unspecified  Abnormal posture     Problem List Patient Active Problem List   Diagnosis Date Noted  . Bee sting allergy 11/27/2018  . Right carpal tunnel syndrome 11/27/2018  . Rib pain on left side 11/27/2018  . Degenerative disc disease, lumbar 07/24/2017  . Sadness 07/24/2017  . Left lumbar radiculopathy 06/02/2017  . Costochondritis, acute 06/02/2017  . Atrophic kidney 11/18/2015  . Bilateral hand pain 11/18/2015  . Chronic lower back pain 11/18/2015  . Bilateral hearing loss 11/18/2015  . Recurrent boils 09/09/2015  . Dysuria 03/25/2015  . Microhematuria 11/14/2014  . Toe pain, right 07/08/2014  . Bunion of great toe of right foot 07/08/2014  . Other screening mammogram 10/22/2013  . Dog bite of finger 08/01/2013  . Swelling of third finger of right hand 01/07/2013  . Cervical radiculitis 11/29/2012  . Allergic rhinitis, cause unspecified 11/29/2012  . Encounter for well adult exam with abnormal findings 10/19/2012  . Impaired glucose tolerance 10/19/2012  . Alopecia areata 10/19/2012  . Osteoporosis 10/19/2012  . Hyperlipidemia 10/23/2011  . Bloating symptom 10/19/2011  . Onychomycosis 10/19/2011  . Urinary frequency 10/19/2011  . Positive TB test   .  Episodic low back pain   . GERD (gastroesophageal reflux disease)   . Cluster headache, episodic     Carney Living PT DPT  03/20/2019, 12:23 PM  [facility] [address] [place], [place], [address] Phone: [phone]   Fax:  [phone]  Name: Beverly Shaw MRN: [id number] Date of Birth: 1951/02/21

## 2019-03-21 ENCOUNTER — Encounter: Payer: Self-pay | Admitting: Physical Therapy

## 2019-03-21 NOTE — Therapy (Signed)
[place], [place], [address] Phone: [phone]   Fax:  [phone]  Physical Therapy Treatment  Patient Details  Name: Beverly Shaw MRN: [id number] Date of Birth: 04-25-1951 Referring Provider (PT): Dr Hulan Saas    Encounter Date: 03/20/2019  PT End of Session - 03/21/19 1526    Visit Number  7    Number of Visits  12    Date for PT Re-Evaluation  03/29/19    Authorization Type  [employer] Medicare    PT Start Time  [id number]    PT Stop Time  1627    PT Time Calculation (min)  42 min    Activity Tolerance  Patient tolerated treatment well    Behavior During Therapy  [facility] for tasks assessed/performed       Past Medical History:  Diagnosis Date  . Atrophic kidney 11/18/2015  . Episodic low back pain   . GERD (gastroesophageal reflux disease)   . Heart murmur   . Migraines   . Osteopenia 10/15/2010  . Osteoporosis, unspecified 10/19/2012  . Positive TB test    Positive TB skin test    Past Surgical History:  Procedure Laterality Date  . ABDOMINAL HYSTERECTOMY  1996  . TUBAL LIGATION      There were no vitals filed for this visit.  Subjective Assessment - 03/20/19 1550    Subjective  Patient began having radiating pain down to her heel today. The pain started while she was sitting at work. She had minor soreness after tretment yesterday.    How long can you sit comfortably?  can sit around and hour but it depends    How long can you stand comfortably?  improves pain    How long can you walk comfortably?  walking helpls    Diagnostic tests  X-ray: 2015: severe L4-L5 degeneration mild scoliosis    Patient Stated Goals  improve mobility    Currently in Pain?  Yes    Pain Score  7     Pain Location  Back    Pain Orientation  Lower;Left    Pain Descriptors / Indicators  Aching    Pain Type  Acute pain    Pain Radiating Towards  pain going down the left leg    Pain Onset  More than a month ago    Pain Frequency  Constant    Aggravating Factors   sitting    Pain Relieving Factors  standing and walking                       [facility] Adult PT Treatment/Exercise - 03/21/19 0001      Lumbar Exercises: Stretches   Active Hamstring Stretch Limitations  hamstring stretch 3x20 sec hold encouraged to do at home.     Lower Trunk Rotation  20 seconds    Figure 4 Stretch Limitations  hooklying pull across      Lumbar Exercises: Aerobic   Recumbent Bike  5 min L2      Lumbar Exercises: Standing   Other Standing Lumbar Exercises  scap retraction 2x10 red with abvdominal breathing; shoulder extension 2x10 red  with abdominal breathing       Lumbar Exercises: Supine   Bent Knee Raise Limitations  supine march 2x10;     Bridge  15 reps    Other Supine Lumbar Exercises  clamshell 2x10 red; supine march 2x10       Manual Therapy  Manual Therapy  Manual Traction    Manual therapy comments  skilled palpation and monitoring during TPDN    Joint Mobilization  PA glides form L1 -L5. Decreased PA throuout her spine     Soft tissue mobilization  ISASTYM to left lumbar paraspinals and upper gluteals     Manual Traction  LAD 5x20 sec hold bilateral with grade II and III ocilations              PT Education - 03/21/19 1521    Education Details  reviewed symptom manement    Person(s) Educated  Patient    Methods  Explanation;Demonstration;Tactile cues;Verbal cues    Comprehension  Verbalized understanding;Returned demonstration;Tactile cues required;Verbal cues required       PT Short Term Goals - 03/20/19 1219      PT SHORT TERM GOAL #1   Title  Patient will increase lumbar flexion by 20 degrees    Baseline  per visual inspection improved    Time  3    Period  Weeks    Status  Achieved    Target Date  03/08/19      PT SHORT TERM GOAL #2   Title  Patient will report no pain radiating down her left leg    Baseline  not yet changed    Time  3    Period  Weeks     Status  Achieved    Target Date  03/08/19      PT SHORT TERM GOAL #3   Title  Patient will increase gross left lower extremity strength to 4+/5    Baseline  not tested    Time  3    Period  Weeks    Status  Achieved    Target Date  03/08/19        PT Long Term Goals - 02/15/19 1049      PT LONG TERM GOAL #1   Title  [name] will increase lumbar flexion to 65 degrees in order to bend down and pick item off the ground without pain    Time  6    Period  Weeks    Status  New    Target Date  03/29/19      PT LONG TERM GOAL #2   Title  Patient will sit at work for 4 hours without increased lower back pain    Time  6    Period  Weeks    Status  New    Target Date  03/29/19      PT LONG TERM GOAL #3   Title  Patient will demonstrate a 31 % limitation on FOTO    Time  6    Period  Weeks    Status  New    Target Date  03/29/19            Plan - 03/21/19 1527    Clinical Impression Statement  Patient had increased spasming on the left side but still improvd from the beggining of her treatment. She was advised to continue stretching and light strengthening. Therapy focused on manual therapy to reduces spasming and improve pain.    Comorbidities  significant lumbar OA, osteoperosis,    Examination-Activity Limitations  Bend;Squat;Sit    Examination-Participation Restrictions  Driving;Other;Community Activity    Stability/Clinical Decision Making  Evolving/Moderate complexity    Clinical Decision Making  Moderate    Rehab Potential  Good    PT Frequency  2x / week    PT Duration  6 weeks    PT Treatment/Interventions  ADLs/Self Care Home Management;Cryotherapy;Electrical Stimulation;Moist Heat;Ultrasound;Gait training;Functional mobility training;Therapeutic activities;Therapeutic exercise;Neuromuscular re-education;Patient/family education;Manual techniques;Passive range of motion;Dry needling;Taping    PT Next Visit Plan  TPDN, patient will have two weeks to work on her HEP.  Assess carryover.    PT Home Exercise Plan  given stretches for her desk. Tennis ball; hamstring stretch seated, pirifromis stretch bilateral, stationary bike 3days/week 10+ min, child pose, LTR, SKTC, post pelvic tilt; bridge, clam, qped hip ext, child pose, standing gastroc stretch    Consulted and Agree with Plan of Care  Patient       Patient will benefit from skilled therapeutic intervention in order to improve the following deficits and impairments:  Increased muscle spasms, Decreased range of motion, Decreased activity tolerance, Decreased strength, Pain  Visit Diagnosis: Radiculopathy, lumbar region  Chronic bilateral low back pain with left-sided sciatica  Muscle spasm of back     Problem List Patient Active Problem List   Diagnosis Date Noted  . Bee sting allergy 11/27/2018  . Right carpal tunnel syndrome 11/27/2018  . Rib pain on left side 11/27/2018  . Degenerative disc disease, lumbar 07/24/2017  . Sadness 07/24/2017  . Left lumbar radiculopathy 06/02/2017  . Costochondritis, acute 06/02/2017  . Atrophic kidney 11/18/2015  . Bilateral hand pain 11/18/2015  . Chronic lower back pain 11/18/2015  . Bilateral hearing loss 11/18/2015  . Recurrent boils 09/09/2015  . Dysuria 03/25/2015  . Microhematuria 11/14/2014  . Toe pain, right 07/08/2014  . Bunion of great toe of right foot 07/08/2014  . Other screening mammogram 10/22/2013  . Dog bite of finger 08/01/2013  . Swelling of third finger of right hand 01/07/2013  . Cervical radiculitis 11/29/2012  . Allergic rhinitis, cause unspecified 11/29/2012  . Encounter for well adult exam with abnormal findings 10/19/2012  . Impaired glucose tolerance 10/19/2012  . Alopecia areata 10/19/2012  . Osteoporosis 10/19/2012  . Hyperlipidemia 10/23/2011  . Bloating symptom 10/19/2011  . Onychomycosis 10/19/2011  . Urinary frequency 10/19/2011  . Positive TB test   . Episodic low back pain   . GERD (gastroesophageal reflux  disease)   . Cluster headache, episodic     Carney Living PT DPT  03/21/2019, 3:42 PM  [facility] [address] [place], [place], [address] Phone: [phone]   Fax:  [phone]  Name: Beverly Shaw MRN: [id number] Date of Birth: [birth year]

## 2019-04-05 ENCOUNTER — Ambulatory Visit: Payer: Medicare HMO | Attending: Family Medicine | Admitting: Physical Therapy

## 2019-04-05 ENCOUNTER — Encounter: Payer: Self-pay | Admitting: Physical Therapy

## 2019-04-05 ENCOUNTER — Other Ambulatory Visit: Payer: Self-pay

## 2019-04-05 DIAGNOSIS — M5416 Radiculopathy, lumbar region: Secondary | ICD-10-CM | POA: Diagnosis not present

## 2019-04-05 DIAGNOSIS — M5440 Lumbago with sciatica, unspecified side: Secondary | ICD-10-CM | POA: Diagnosis not present

## 2019-04-05 DIAGNOSIS — M5442 Lumbago with sciatica, left side: Secondary | ICD-10-CM | POA: Diagnosis not present

## 2019-04-05 DIAGNOSIS — M62830 Muscle spasm of back: Secondary | ICD-10-CM

## 2019-04-05 DIAGNOSIS — G8929 Other chronic pain: Secondary | ICD-10-CM | POA: Diagnosis not present

## 2019-04-05 NOTE — Therapy (Signed)
[place], [place], [address] Phone: [phone]   Fax:  [phone]  Physical Therapy Treatment/Progress note  Patient Details  Name: Beverly Shaw MRN: [id number] Date of Birth: Jul 13, 1950 Referring Provider (PT): Dr Hulan Saas   Progress Note Reporting Period 02/15/2019  to 04/05/2019  See note below for Objective Data and Assessment of Progress/Goals.          Encounter Date: 04/05/2019  PT End of Session - 04/05/19 1007    Visit Number  8    Number of Visits  12    Date for PT Re-Evaluation  05/03/19    Authorization Type  [employer]na [employer]    PT Start Time  [phone]    PT Stop Time  1015    PT Time Calculation (min)  44 min    Activity Tolerance  Patient tolerated treatment well    Behavior During Therapy  [facility] for tasks assessed/performed       Past Medical History:  Diagnosis Date  . Atrophic kidney 11/18/2015  . Episodic low back pain   . GERD (gastroesophageal reflux disease)   . Heart murmur   . Migraines   . Osteopenia 10/15/2010  . Osteoporosis, unspecified 10/19/2012  . Positive TB test    Positive TB skin test    Past Surgical History:  Procedure Laterality Date  . ABDOMINAL HYSTERECTOMY  1996  . TUBAL LIGATION      There were no vitals filed for this visit.  Subjective Assessment - 04/05/19 0933    Subjective  Patient reports her pain has been more managelable. 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 She feels like if her pain stays like this she can continue to manage it.    How long can you sit comfortably?  can sit around and hour but it depends    How long can you stand comfortably?  improves pain    How long can you walk comfortably?  walking helpls    Diagnostic tests  X-ray: 2015: severe L4-L5 degeneration mild scoliosis    Currently in Pain?  Yes    Pain Score  2     Pain Location  Back    Pain Orientation  Right    Pain Descriptors / Indicators  Aching    Pain Type  Chronic pain    Pain Onset  More than a month ago    Pain Frequency  Constant    Aggravating Factors   sitting    Pain Relieving Factors  standing and walking    Multiple Pain Sites  No         [facility] PT Assessment - 04/05/19 0001      AROM   Lumbar Flexion  55    Lumbar - Right Side Bend  improved tightness       Strength   Left Hip Flexion  4+/5    Left Hip ABduction  4+/5    Left Hip ADduction  4+/5    Left Knee Extension  4+/5                   [facility] Adult PT Treatment/Exercise - 04/05/19 0001      Self-Care   Self-Care  Lifting      Lumbar Exercises: Standing   Other Standing Lumbar Exercises  scap retraction 2x10 green with abvdominal breathing; shoulder extension 2x10 red  with abdominal breathing       Manual Therapy   Manual Therapy  Manual Traction    Manual therapy comments  skilled palpation and monitoring during TPDN    Joint Mobilization  PA glides form L1 -L5. Decreased PA throuout her spine     Soft tissue mobilization  ISASTYM to left lumbar paraspinals and upper gluteals     Manual Traction  LAD 5x20 sec hold bilateral with grade II and III ocilations        Trigger Point Dry Needling - 04/05/19 0001    Consent Given?  Yes    Education Handout Provided  Previously provided    Gluteus Maximus Response  Twitch response elicited;Palpable increased muscle length           PT Education - 04/05/19 1006    Education Details  lifting techique; symptom mangement    Person(s) Educated  Patient    Methods  Explanation;Demonstration;Verbal cues;Tactile cues    Comprehension  Verbalized understanding;Verbal cues required;Returned demonstration;Tactile cues required       PT Short Term Goals - 04/05/19 1146      PT SHORT TERM GOAL #1   Title  Patient will increase lumbar flexion by 20 degrees    Baseline  per visual inspection improved    Time  3    Period  Weeks    Status  Achieved      PT SHORT TERM GOAL #2   Title   Patient will report no pain radiating down her left leg    Baseline  not yet changed    Time  3    Period  Weeks    Status  Achieved    Target Date  03/08/19      PT SHORT TERM GOAL #3   Title  Patient will increase gross left lower extremity strength to 4+/5    Baseline  not tested    Time  3    Period  Weeks    Status  Achieved    Target Date  03/08/19        PT Long Term Goals - 02/15/19 1049      PT LONG TERM GOAL #1   Title  [name] will increase lumbar flexion to 65 degrees in order to bend down and pick item off the ground without pain    Time  6    Period  Weeks    Status  New    Target Date  03/29/19      PT LONG TERM GOAL #2   Title  Patient will sit at work for 4 hours without increased lower back pain    Time  6    Period  Weeks    Status  New    Target Date  03/29/19      PT LONG TERM GOAL #3   Title  Patient will demonstrate a 31 % limitation on FOTO    Time  6    Period  Weeks    Status  New    Target Date  03/29/19            Plan - 04/05/19 1008    Clinical Impression Statement  Patient tolerated treatment well. She is having much less spasming in her paraspinals. Therapy reviewed proper lifting technique with the patient. When she uses proper technique she goes back on her heels. She was advised to practive at home before she starts lifting. Therapy needled her gluteals today. she had no trigger points in her lumbar spine. She had a good twtich respose. The patient has 1 more  visit scheduled. We will re-certify her for that visit. She has made great progress. she has improved lumbar motion and LE strength. She will then have surgery on her hand and hopeds to continue working on her HEP on her own.    Personal Factors and Comorbidities  Comorbidity 1;Comorbidity 2    Comorbidities  significant lumbar OA, osteoperosis,    Examination-Activity Limitations  Bend;Squat;Sit    Examination-Participation Restrictions  Driving;Other;Community Activity     Clinical Decision Making  Moderate    Rehab Potential  Good    PT Frequency  1x / week    PT Duration  4 weeks    PT Treatment/Interventions  ADLs/Self Care Home Management;Cryotherapy;Electrical Stimulation;Moist Heat;Ultrasound;Gait training;Functional mobility training;Therapeutic activities;Therapeutic exercise;Neuromuscular re-education;Patient/family education;Manual techniques;Passive range of motion;Dry needling;Taping    PT Next Visit Plan  TPDN, patient will have two weeks to work on her HEP. Assess carryover. review final HEP; review stretches and exercises where she dosent need to use her hand.    PT Home Exercise Plan  given stretches for her desk. Tennis ball; hamstring stretch seated, pirifromis stretch bilateral, stationary bike 3days/week 10+ min, child pose, LTR, SKTC, post pelvic tilt; bridge, clam, qped hip ext, child pose, standing gastroc stretch    Consulted and Agree with Plan of Care  Patient       Patient will benefit from skilled therapeutic intervention in order to improve the following deficits and impairments:  Increased muscle spasms, Decreased range of motion, Decreased activity tolerance, Decreased strength, Pain  Visit Diagnosis: Radiculopathy, lumbar region  Chronic bilateral low back pain with left-sided sciatica  Muscle spasm of back     Problem List Patient Active Problem List   Diagnosis Date Noted  . Bee sting allergy 11/27/2018  . Right carpal tunnel syndrome 11/27/2018  . Rib pain on left side 11/27/2018  . Degenerative disc disease, lumbar 07/24/2017  . Sadness 07/24/2017  . Left lumbar radiculopathy 06/02/2017  . Costochondritis, acute 06/02/2017  . Atrophic kidney 11/18/2015  . Bilateral hand pain 11/18/2015  . Chronic lower back pain 11/18/2015  . Bilateral hearing loss 11/18/2015  . Recurrent boils 09/09/2015  . Dysuria 03/25/2015  . Microhematuria 11/14/2014  . Toe pain, right 07/08/2014  . Bunion of great toe of right foot  07/08/2014  . Other screening mammogram 10/22/2013  . Dog bite of finger 08/01/2013  . Swelling of third finger of right hand 01/07/2013  . Cervical radiculitis 11/29/2012  . Allergic rhinitis, cause unspecified 11/29/2012  . Encounter for well adult exam with abnormal findings 10/19/2012  . Impaired glucose tolerance 10/19/2012  . Alopecia areata 10/19/2012  . Osteoporosis 10/19/2012  . Hyperlipidemia 10/23/2011  . Bloating symptom 10/19/2011  . Onychomycosis 10/19/2011  . Urinary frequency 10/19/2011  . Positive TB test   . Episodic low back pain   . GERD (gastroesophageal reflux disease)   . Cluster headache, episodic     Carney Living PT DPT  04/05/2019, 11:53 AM  [facility] [address] [place], [place], [address] Phone: [phone]   Fax:  [phone]  Name: Beverly Shaw MRN: [id number] Date of Birth: 1950/10/01

## 2019-04-12 ENCOUNTER — Ambulatory Visit: Payer: Medicare HMO | Admitting: Physical Therapy

## 2019-04-12 ENCOUNTER — Encounter: Payer: Self-pay | Admitting: Physical Therapy

## 2019-04-12 ENCOUNTER — Other Ambulatory Visit: Payer: Self-pay

## 2019-04-12 DIAGNOSIS — G8929 Other chronic pain: Secondary | ICD-10-CM

## 2019-04-12 DIAGNOSIS — M62830 Muscle spasm of back: Secondary | ICD-10-CM

## 2019-04-12 DIAGNOSIS — M5440 Lumbago with sciatica, unspecified side: Secondary | ICD-10-CM

## 2019-04-12 DIAGNOSIS — M5442 Lumbago with sciatica, left side: Secondary | ICD-10-CM | POA: Diagnosis not present

## 2019-04-12 DIAGNOSIS — M5416 Radiculopathy, lumbar region: Secondary | ICD-10-CM

## 2019-04-12 NOTE — Therapy (Addendum)
[place], [place], [address] Phone: [phone]   Fax:  [phone]  Physical Therapy Treatment/Discharge   Patient Details  Name: Beverly Shaw MRN: [id number] Date of Birth: 06-04-1950 Referring Provider (PT): Dr Hulan Saas    Encounter Date: 04/12/2019  PT End of Session - 04/12/19 1213    Visit Number  10    Number of Visits  12    Date for PT Re-Evaluation  05/03/19    Authorization Type  [employer]    PT Start Time  0930    PT Stop Time  1000    PT Time Calculation (min)  30 min       Past Medical History:  Diagnosis Date  . Atrophic kidney 11/18/2015  . Episodic low back pain   . GERD (gastroesophageal reflux disease)   . Heart murmur   . Migraines   . Osteopenia 10/15/2010  . Osteoporosis, unspecified 10/19/2012  . Positive TB test    Positive TB skin test    Past Surgical History:  Procedure Laterality Date  . ABDOMINAL HYSTERECTOMY  1996  . TUBAL LIGATION      There were no vitals filed for this visit.  Subjective Assessment - 04/12/19 0934    Subjective  Patient reports she only had 1 incednet ove the past week wehre she had a significant increase in pain. she was able to use her stretches and her hot pack to get her pain down. She had no pain this morning.    How long can you sit comfortably?  can sit around and hour but it depends    How long can you stand comfortably?  improves pain    Diagnostic tests  X-ray: 2015: severe L4-L5 degeneration mild scoliosis    Patient Stated Goals  improve mobility    Currently in Pain?  No/denies         [facility] PT Assessment - 04/12/19 0001      AROM   Lumbar Flexion  60    Lumbar - Right Side Bend  decreased tightness       Strength   Left Hip Flexion  5/5    Left Hip Extension  5/5    Left Hip ABduction  5/5    Left Hip ADduction  5/5    Left Knee Extension  5/5      Palpation   Palpation comment  significant  improvement in spasming                    [facility] Adult PT Treatment/Exercise - 04/12/19 0001      Self-Care   Self-Care  Other Self-Care Comments    Other Self-Care Comments   reviewed patients program going forward and how she should use her stretches and exercises. The patient has a good understanding of how to use her program.       Lumbar Exercises: Stretches   Active Hamstring Stretch Limitations  hamstring stretch 3x20 sec hold encouraged to do at home.     Lower Trunk Rotation  20 seconds    Figure 4 Stretch Limitations  hooklying pull across      Lumbar Exercises: Aerobic   Recumbent Bike  5 min L2      Lumbar Exercises: Supine   Bent Knee Raise Limitations  reviewed for HEP     Bridge  15 reps    Other Supine Lumbar Exercises  clamshell 2x10 red; supine march 2x10  Manual Therapy   Manual Therapy  Manual Traction    Manual therapy comments  skilled palpation and monitoring during TPDN    Joint Mobilization  PA glides form L1 -L5. Decreased PA throuout her spine     Soft tissue mobilization  ISASTYM to left lumbar paraspinals and upper gluteals     Manual Traction  LAD 5x20 sec hold bilateral with grade II and III ocilations              PT Education - 04/12/19 0936    Education Details  reviewed final HEP    Person(s) Educated  Patient    Methods  Demonstration;Tactile cues;Explanation;Verbal cues    Comprehension  Returned demonstration;Verbal cues required;Tactile cues required;Verbalized understanding       PT Short Term Goals - 04/12/19 1210      PT SHORT TERM GOAL #1   Title  Patient will increase lumbar flexion by 20 degrees    Baseline  per visual inspection improved    Time  3    Period  Weeks    Status  Achieved    Target Date  03/08/19      PT SHORT TERM GOAL #2   Title  Patient will report no pain radiating down her left leg    Baseline  not yet changed    Time  3    Period  Weeks    Status  Achieved      PT SHORT TERM  GOAL #3   Title  Patient will increase gross left lower extremity strength to 4+/5    Baseline  5/5    Time  3    Period  Weeks    Status  Achieved        PT Long Term Goals - 02/15/19 1049      PT LONG TERM GOAL #1   Title  [name] will increase lumbar flexion to 65 degrees in order to bend down and pick item off the ground without pain    Time  6    Period  Weeks    Status  New    Target Date  03/29/19      PT LONG TERM GOAL #2   Title  Patient will sit at work for 4 hours without increased lower back pain    Time  6    Period  Weeks    Status  New    Target Date  03/29/19      PT LONG TERM GOAL #3   Title  Patient will demonstrate a 31 % limitation on FOTO    Time  6    Period  Weeks    Status  New    Target Date  03/29/19            Plan - 04/12/19 1002    Clinical Impression Statement  Patient has progressed well. She is having less pain at work. When she does have pain she can manage it. She has a full exercise program and is perfrorming it at home. Her spasming has improved significantly. She can sit for longer periods at work. she has minor pain doing housework. She will be having surgery on her hand. she hopes to put her treatment on hold and see how she does with it while she recovers. If patient needs to come back she will call.    Personal Factors and Comorbidities  Comorbidity 1;Comorbidity 2    Comorbidities  significant lumbar OA, osteoperosis,    Examination-Activity  Limitations  Bend;Squat;Sit    Examination-Participation Restrictions  Driving;Other;Community Activity    Stability/Clinical Decision Making  Evolving/Moderate complexity    Clinical Decision Making  Moderate    Rehab Potential  Good    PT Frequency  1x / week    PT Duration  4 weeks    PT Treatment/Interventions  ADLs/Self Care Home Management;Cryotherapy;Electrical Stimulation;Moist Heat;Ultrasound;Gait training;Functional mobility training;Therapeutic activities;Therapeutic  exercise;Neuromuscular re-education;Patient/family education;Manual techniques;Passive range of motion;Dry needling;Taping    PT Next Visit Plan  if patient returns assess tolerance to activity    PT Home Exercise Plan  given stretches for her desk. Tennis ball; hamstring stretch seated, pirifromis stretch bilateral, stationary bike 3days/week 10+ min, child pose, LTR, SKTC, post pelvic tilt; bridge, clam, qped hip ext, child pose, standing gastroc stretch    Consulted and Agree with Plan of Care  Patient       Patient will benefit from skilled therapeutic intervention in order to improve the following deficits and impairments:  Increased muscle spasms, Decreased range of motion, Decreased activity tolerance, Decreased strength, Pain  Visit Diagnosis: Radiculopathy, lumbar region  Chronic bilateral low back pain with left-sided sciatica  Muscle spasm of back  Chronic bilateral low back pain with sciatica, sciatica laterality unspecified     Problem List Patient Active Problem List   Diagnosis Date Noted  . Bee sting allergy 11/27/2018  . Right carpal tunnel syndrome 11/27/2018  . Rib pain on left side 11/27/2018  . Degenerative disc disease, lumbar 07/24/2017  . Sadness 07/24/2017  . Left lumbar radiculopathy 06/02/2017  . Costochondritis, acute 06/02/2017  . Atrophic kidney 11/18/2015  . Bilateral hand pain 11/18/2015  . Chronic lower back pain 11/18/2015  . Bilateral hearing loss 11/18/2015  . Recurrent boils 09/09/2015  . Dysuria 03/25/2015  . Microhematuria 11/14/2014  . Toe pain, right 07/08/2014  . Bunion of great toe of right foot 07/08/2014  . Other screening mammogram 10/22/2013  . Dog bite of finger 08/01/2013  . Swelling of third finger of right hand 01/07/2013  . Cervical radiculitis 11/29/2012  . Allergic rhinitis, cause unspecified 11/29/2012  . Encounter for well adult exam with abnormal findings 10/19/2012  . Impaired glucose tolerance 10/19/2012  .  Alopecia areata 10/19/2012  . Osteoporosis 10/19/2012  . Hyperlipidemia 10/23/2011  . Bloating symptom 10/19/2011  . Onychomycosis 10/19/2011  . Urinary frequency 10/19/2011  . Positive TB test   . Episodic low back pain   . GERD (gastroesophageal reflux disease)   . Cluster headache, episodic     PHYSICAL THERAPY DISCHARGE SUMMARY  Visits from Start of Care: 10  Current functional level related to goals / functional outcomes: Improved ability to sit at work    Remaining deficits: Pain at times   Education / Equipment: HEP   Plan: Patient agrees to discharge.  Patient goals were met. Patient is being discharged due to meeting the stated rehab goals.  ?????       Carney Living PT DPT  04/12/2019, 12:15 PM  [facility] [address] [place], [place], [address] Phone: [phone]   Fax:  [phone]  Name: Beverly Shaw MRN: [id number] Date of Birth: [birth year]

## 2019-04-25 DIAGNOSIS — M1811 Unilateral primary osteoarthritis of first carpometacarpal joint, right hand: Secondary | ICD-10-CM | POA: Diagnosis not present

## 2019-05-07 DIAGNOSIS — M1811 Unilateral primary osteoarthritis of first carpometacarpal joint, right hand: Secondary | ICD-10-CM | POA: Diagnosis not present

## 2019-05-07 DIAGNOSIS — M79641 Pain in right hand: Secondary | ICD-10-CM | POA: Diagnosis not present

## 2019-05-07 DIAGNOSIS — Z4789 Encounter for other orthopedic aftercare: Secondary | ICD-10-CM | POA: Diagnosis not present

## 2019-05-14 ENCOUNTER — Ambulatory Visit: Payer: Medicare HMO | Admitting: Family Medicine

## 2019-05-14 DIAGNOSIS — M79641 Pain in right hand: Secondary | ICD-10-CM | POA: Diagnosis not present

## 2019-05-23 DIAGNOSIS — M79641 Pain in right hand: Secondary | ICD-10-CM | POA: Diagnosis not present

## 2019-05-31 DIAGNOSIS — M79641 Pain in right hand: Secondary | ICD-10-CM | POA: Diagnosis not present

## 2019-06-11 DIAGNOSIS — Z4789 Encounter for other orthopedic aftercare: Secondary | ICD-10-CM | POA: Diagnosis not present

## 2019-06-11 DIAGNOSIS — M79641 Pain in right hand: Secondary | ICD-10-CM | POA: Diagnosis not present

## 2019-06-11 DIAGNOSIS — M1811 Unilateral primary osteoarthritis of first carpometacarpal joint, right hand: Secondary | ICD-10-CM | POA: Diagnosis not present

## 2019-07-04 DIAGNOSIS — R69 Illness, unspecified: Secondary | ICD-10-CM | POA: Diagnosis not present

## 2019-07-23 ENCOUNTER — Telehealth: Payer: Self-pay | Admitting: Internal Medicine

## 2019-07-23 NOTE — Telephone Encounter (Signed)
New Message:   Patient is calling to see what is her status is of the Prolia shot. Please advise.

## 2019-07-23 NOTE — Telephone Encounter (Signed)
Can you help with question regarding Prolia?

## 2019-07-24 NOTE — Telephone Encounter (Signed)
I am currently waiting on a response regarding the PA for a Prolia. It has been submitting, hoping for an answer this week. Called patient and left message informing.

## 2019-07-26 DIAGNOSIS — M1811 Unilateral primary osteoarthritis of first carpometacarpal joint, right hand: Secondary | ICD-10-CM | POA: Diagnosis not present

## 2019-08-05 ENCOUNTER — Telehealth: Payer: Self-pay

## 2019-08-05 NOTE — Telephone Encounter (Signed)
Patient called wanted a call back to see if we heard anything in regard to her prolia

## 2019-08-06 NOTE — Telephone Encounter (Signed)
Waiting on PA. Called patient and informed.

## 2019-08-19 NOTE — Telephone Encounter (Signed)
Spoke to insurance compay. PA has been approved through 12/2019. Patient is responsible for a $255 copay.  Due anytime.  Left message for patient to call back to schedule.  Okay to schedule... Visit Note: Prolia ($255 copay - okay to give per Gareth Eagle) Visit Type: Nurse Provider: Nurse

## 2019-08-19 NOTE — Telephone Encounter (Signed)
   Patient checking status of PA for Prolia

## 2019-08-28 ENCOUNTER — Other Ambulatory Visit: Payer: Self-pay

## 2019-08-28 ENCOUNTER — Ambulatory Visit (INDEPENDENT_AMBULATORY_CARE_PROVIDER_SITE_OTHER): Payer: Medicare HMO | Admitting: *Deleted

## 2019-08-28 DIAGNOSIS — M810 Age-related osteoporosis without current pathological fracture: Secondary | ICD-10-CM

## 2019-08-28 MED ORDER — DENOSUMAB 60 MG/ML ~~LOC~~ SOSY
60.0000 mg | PREFILLED_SYRINGE | Freq: Once | SUBCUTANEOUS | Status: AC
  Administered 2019-08-28: 60 mg via SUBCUTANEOUS

## 2019-08-28 NOTE — Progress Notes (Signed)
Pls cosign for prolia../lmb 

## 2019-09-12 MED FILL — predniSONE 20 MG TABS: 20 | 12 days supply | Qty: 27 | Fill #0

## 2019-10-22 DIAGNOSIS — L814 Other melanin hyperpigmentation: Secondary | ICD-10-CM | POA: Diagnosis not present

## 2019-10-22 DIAGNOSIS — L578 Other skin changes due to chronic exposure to nonionizing radiation: Secondary | ICD-10-CM | POA: Diagnosis not present

## 2019-10-22 DIAGNOSIS — D225 Melanocytic nevi of trunk: Secondary | ICD-10-CM | POA: Diagnosis not present

## 2019-10-22 DIAGNOSIS — D2271 Melanocytic nevi of right lower limb, including hip: Secondary | ICD-10-CM | POA: Diagnosis not present

## 2019-10-22 DIAGNOSIS — L237 Allergic contact dermatitis due to plants, except food: Secondary | ICD-10-CM | POA: Diagnosis not present

## 2019-10-22 DIAGNOSIS — L821 Other seborrheic keratosis: Secondary | ICD-10-CM | POA: Diagnosis not present

## 2019-10-22 DIAGNOSIS — L820 Inflamed seborrheic keratosis: Secondary | ICD-10-CM | POA: Diagnosis not present

## 2019-11-22 ENCOUNTER — Other Ambulatory Visit: Payer: Self-pay

## 2019-11-22 ENCOUNTER — Other Ambulatory Visit (INDEPENDENT_AMBULATORY_CARE_PROVIDER_SITE_OTHER): Payer: Medicare HMO

## 2019-11-22 DIAGNOSIS — Z0000 Encounter for general adult medical examination without abnormal findings: Secondary | ICD-10-CM

## 2019-11-22 DIAGNOSIS — E538 Deficiency of other specified B group vitamins: Secondary | ICD-10-CM

## 2019-11-22 DIAGNOSIS — E559 Vitamin D deficiency, unspecified: Secondary | ICD-10-CM

## 2019-11-22 DIAGNOSIS — E611 Iron deficiency: Secondary | ICD-10-CM | POA: Diagnosis not present

## 2019-11-22 LAB — VITAMIN D 25 HYDROXY (VIT D DEFICIENCY, FRACTURES): VITD: 81.64 ng/mL (ref 30.00–100.00)

## 2019-11-22 LAB — BASIC METABOLIC PANEL
BUN: 17 mg/dL (ref 6–23)
CO2: 30 mEq/L (ref 19–32)
Calcium: 9.4 mg/dL (ref 8.4–10.5)
Chloride: 105 mEq/L (ref 96–112)
Creatinine, Ser: 0.75 mg/dL (ref 0.40–1.20)
GFR: 76.98 mL/min (ref >60.00)
Glucose, Bld: 106 mg/dL — ABNORMAL HIGH (ref 70–99)
Potassium: 4.2 mEq/L (ref 3.5–5.1)
Sodium: 142 mEq/L (ref 135–145)

## 2019-11-22 LAB — LIPID PANEL
Cholesterol: 237 mg/dL — ABNORMAL HIGH (ref 0–200)
HDL: 49.3 mg/dL (ref >39.00)
NonHDL: 187.88
Total CHOL/HDL Ratio: 5
Triglycerides: 240 mg/dL — ABNORMAL HIGH (ref 0.0–149.0)
VLDL: 48 mg/dL — ABNORMAL HIGH (ref 0.0–40.0)

## 2019-11-22 LAB — CBC WITH DIFFERENTIAL/PLATELET
Basophils Absolute: 0 10*3/uL (ref 0.0–0.1)
Basophils Relative: 0.3 % (ref 0.0–3.0)
Eosinophils Absolute: 0.1 10*3/uL (ref 0.0–0.7)
Eosinophils Relative: 1.4 % (ref 0.0–5.0)
HCT: 40.9 % (ref 36.0–46.0)
Hemoglobin: 13.7 g/dL (ref 12.0–15.0)
Lymphocytes Relative: 42.1 % (ref 12.0–46.0)
Lymphs Abs: 2.5 10*3/uL (ref 0.7–4.0)
MCHC: 33.5 g/dL (ref 30.0–36.0)
MCV: 88.1 fl (ref 78.0–100.0)
Monocytes Absolute: 0.5 10*3/uL (ref 0.1–1.0)
Monocytes Relative: 8.8 % (ref 3.0–12.0)
Neutro Abs: 2.8 10*3/uL (ref 1.4–7.7)
Neutrophils Relative %: 47.4 % (ref 43.0–77.0)
Platelets: 204 10*3/uL (ref 150.0–400.0)
RBC: 4.64 Mil/uL (ref 3.87–5.11)
RDW: 13.1 % (ref 11.5–15.5)
WBC: 5.9 10*3/uL (ref 4.0–10.5)

## 2019-11-22 LAB — URINALYSIS, ROUTINE W REFLEX MICROSCOPIC
Bilirubin Urine: NEGATIVE
Ketones, ur: NEGATIVE
Leukocytes,Ua: NEGATIVE
Nitrite: NEGATIVE
Specific Gravity, Urine: 1.025 (ref 1.000–1.030)
Total Protein, Urine: NEGATIVE
Urine Glucose: NEGATIVE
Urobilinogen, UA: 0.2 (ref 0.0–1.0)
pH: 6 (ref 5.0–8.0)

## 2019-11-22 LAB — HEPATIC FUNCTION PANEL
ALT: 25 U/L (ref 0–35)
AST: 23 U/L (ref 0–37)
Albumin: 4.4 g/dL (ref 3.5–5.2)
Alkaline Phosphatase: 44 U/L (ref 39–117)
Bilirubin, Direct: 0.1 mg/dL (ref 0.0–0.3)
Total Bilirubin: 0.6 mg/dL (ref 0.2–1.2)
Total Protein: 6.9 g/dL (ref 6.0–8.3)

## 2019-11-22 LAB — IBC PANEL
Iron: 125 ug/dL (ref 42–145)
Saturation Ratios: 32.6 % (ref 20.0–50.0)
Transferrin: 274 mg/dL (ref 212.0–360.0)

## 2019-11-22 LAB — VITAMIN B12: Vitamin B-12: 763 pg/mL (ref 211–911)

## 2019-11-22 LAB — TSH: TSH: 1.22 u[IU]/mL (ref 0.35–4.50)

## 2019-11-22 LAB — LDL CHOLESTEROL, DIRECT: Direct LDL: 130 mg/dL

## 2019-11-28 ENCOUNTER — Other Ambulatory Visit: Payer: Self-pay

## 2019-11-28 ENCOUNTER — Ambulatory Visit (INDEPENDENT_AMBULATORY_CARE_PROVIDER_SITE_OTHER): Payer: Medicare HMO | Admitting: Internal Medicine

## 2019-11-28 VITALS — BP 140/80 | HR 68 | Temp 98.9°F | Ht 62.0 in | Wt 134.0 lb

## 2019-11-28 DIAGNOSIS — R739 Hyperglycemia, unspecified: Secondary | ICD-10-CM

## 2019-11-28 DIAGNOSIS — Z0001 Encounter for general adult medical examination with abnormal findings: Secondary | ICD-10-CM | POA: Diagnosis not present

## 2019-11-28 DIAGNOSIS — M79605 Pain in left leg: Secondary | ICD-10-CM

## 2019-11-28 DIAGNOSIS — E785 Hyperlipidemia, unspecified: Secondary | ICD-10-CM

## 2019-11-28 DIAGNOSIS — M79604 Pain in right leg: Secondary | ICD-10-CM

## 2019-11-28 NOTE — Progress Notes (Addendum)
Subjective:    Patient ID: Beverly Shaw, female    DOB: Aug 09, 1950, 69 y.o.   MRN: [id number]  HPI  Here for wellness and f/u;  Overall doing ok;  Pt denies Chest pain, worsening SOB, DOE, wheezing, orthopnea, PND, worsening LE edema, palpitations, dizziness or syncope.  Pt denies neurological change such as new headache, facial or extremity weakness.  Pt denies polydipsia, polyuria, or low sugar symptoms. Pt states overall good compliance with treatment and medications, good tolerability, and has been trying to follow appropriate diet.  Pt denies worsening depressive symptoms, suicidal ideation or panic. No fever, night sweats, wt loss, loss of appetite, or other constitutional symptoms.  Pt states good ability with ADL's, has low fall risk, home safety reviewed and adequate, no other significant changes in hearing or vision, and only occasionally active with exercise. S/p right thumb bone spurring surgury in 2020 and conts to improve    Also has lumbar disc dz and mild scoliosis, followed per Dr Cyndy Freeze, who referred to Dr Brien Few and has several ESI that didn't seem to help so stopped, PT helped somewhat, but overall still has daily LBP with AM post left leg pain with tingling in the feet for 30 min before seems to get better. Also a heavy box hit a corner to the left dorsal foot with a bruise that seems to persist and only slowly getting better.  Since then seems to have some trace swelling to the feet she has noticed. Has ongoing left > right knee pain with some swelling to left knee and leg as well off and on.  Also has lost hair to the legs and wondering about arterial circulation.   Past Medical History:  Diagnosis Date  . Atrophic kidney 11/18/2015  . Episodic low back pain   . GERD (gastroesophageal reflux disease)   . Heart murmur   . Migraines   . Osteopenia 10/15/2010  . Osteoporosis, unspecified 10/19/2012  . Positive TB test    Positive TB skin test   Past Surgical History:  Procedure  Laterality Date  . ABDOMINAL HYSTERECTOMY  1996  . TUBAL LIGATION      reports that she has quit smoking. She has never used smokeless tobacco. She reports current alcohol use. She reports that she does not use drugs. family history includes Diabetes in an other family member; Heart disease in her father and mother. Allergies  Allergen Reactions  . Yellow Jacket Venom [Bee Venom]    Current Outpatient Medications on File Prior to Visit  Medication Sig Dispense Refill  . alendronate (FOSAMAX) 70 MG tablet TAKE 1 TABLET BY MOUTH EVERY 7 (SEVEN) DAYS. TAKE WITH A FULL GLASS OF WATER ON AN EMPTY STOMACH. 12 tablet 3  . calcium carbonate (OS-CAL) 600 MG TABS Take 600 mg by mouth daily.      Marland Kitchen EPINEPHrine 0.3 mg/0.3 mL IJ SOAJ injection Inject 0.3 mLs (0.3 mg total) into the muscle as needed for anaphylaxis. 2 each 1  . gabapentin (NEURONTIN) 100 MG capsule Take 1 capsule (100 mg total) by mouth 3 (three) times daily. 90 capsule 3  . gabapentin (NEURONTIN) 100 MG capsule Take 2 capsules (200 mg total) by mouth at bedtime. 180 capsule 3  . Multiple Vitamin (MULTIVITAMIN) capsule Take 1 capsule by mouth daily.      . traMADol (ULTRAM) 50 MG tablet Take 1 tablet (50 mg total) by mouth every 6 (six) hours as needed. 60 tablet 1   No current facility-administered medications  on file prior to visit.   Review of Systems All otherwise neg per pt=    Objective:   Physical Exam BP 140/80 (BP Location: Left Arm, Patient Position: Sitting, Cuff Size: Large)   Pulse 68   Temp 98.9 F (37.2 C) (Oral)   Ht 5\' 2"  (1.575 m)   Wt 134 lb (60.8 kg)   SpO2 99%   BMI 24.51 kg/m  VS noted,  Constitutional: Pt appears in NAD HENT: Head: NCAT.  Right Ear: External ear normal.  Left Ear: External ear normal.  Eyes: . Pupils are equal, round, and reactive to light. Conjunctivae and EOM are normal Nose: without d/c or deformity Neck: Neck supple. Gross normal ROM Cardiovascular: Normal rate and regular  rhythm.   Pulmonary/Chest: Effort normal and breath sounds without rales or wheezing.  Abd:  Soft, NT, ND, + BS, no organomegaly Neurological: Pt is alert. At baseline orientation, motor grossly intact Skin: Skin is warm. No rashes, other new lesions, no LE edema Psychiatric: Pt behavior is normal without agitation  All otherwise neg per pt Lab Results  Component Value Date   WBC 5.9 11/22/2019   HGB 13.7 11/22/2019   HCT 40.9 11/22/2019   PLT 204.0 11/22/2019   GLUCOSE 106 (H) 11/22/2019   CHOL 237 (H) 11/22/2019   TRIG 240.0 (H) 11/22/2019   HDL 49.30 11/22/2019   LDLDIRECT 130.0 11/22/2019   LDLCALC 132 (H) 11/15/2017   ALT 25 11/22/2019   AST 23 11/22/2019   NA 142 11/22/2019   K 4.2 11/22/2019   CL 105 11/22/2019   CREATININE 0.75 11/22/2019   BUN 17 11/22/2019   CO2 30 11/22/2019   TSH 1.22 11/22/2019   HGBA1C 5.4 10/16/2013      Assessment & Plan:

## 2019-11-28 NOTE — Patient Instructions (Signed)
Please continue all other medications as before, and refills have been done if requested.  Please have the pharmacy call with any other refills you may need.  Please continue your efforts at being more active, low cholesterol diet, and weight control.  You are otherwise up to date with prevention measures today.  Please keep your appointments with your specialists as you may have planned  You will be contacted regarding the referral for: leg circulation testing  We have discussed the Cardiac CT Score test to measure the calcification level (if any) in your heart arteries.  This test has been ordered in our [facility], so please call [facility] directly, as they prefer this, at [phone] to be scheduled.  Please make an Appointment to return for your 1 year visit, or sooner if needed, with Lab testing by Appointment as well, to be done about 3-5 days before at the [facility] (so this is for TWO appointments - please see the scheduling desk as you leave)

## 2019-12-02 ENCOUNTER — Other Ambulatory Visit: Payer: Self-pay | Admitting: Internal Medicine

## 2019-12-02 ENCOUNTER — Encounter: Payer: Self-pay | Admitting: Internal Medicine

## 2019-12-02 DIAGNOSIS — Z1231 Encounter for screening mammogram for malignant neoplasm of breast: Secondary | ICD-10-CM

## 2019-12-02 NOTE — Assessment & Plan Note (Signed)

## 2019-12-02 NOTE — Assessment & Plan Note (Addendum)
stable overall by history and exam, recent data reviewed with pt, and pt to continue medical treatment as before,  to f/u any worsening symptoms or concerns, declines statin for now, to also check cardiac ct score

## 2019-12-02 NOTE — Assessment & Plan Note (Addendum)
For LE arterial study,  to f/u any worsening symptoms or concerns  I spent 31 minutes in addition to time for CPX wellness examination in preparing to see the patient by review of recent labs, imaging and procedures, obtaining and reviewing separately obtained history, communicating with the patient and family or caregiver, ordering medications, tests or procedures, and documenting clinical information in the EHR including the differential Dx, treatment, and any further evaluation and other management of bilateral leg pain, hyperglycemia, hld

## 2019-12-02 NOTE — Assessment & Plan Note (Signed)
stable overall by history and exam, recent data reviewed with pt, and pt to continue medical treatment as before,  to f/u any worsening symptoms or concerns  

## 2019-12-13 ENCOUNTER — Ambulatory Visit (INDEPENDENT_AMBULATORY_CARE_PROVIDER_SITE_OTHER)
Admission: RE | Admit: 2019-12-13 | Discharge: 2019-12-13 | Disposition: A | Discharge status: Home / Self Care | Payer: Self-pay | Source: Ambulatory Visit | Attending: Internal Medicine | Admitting: Internal Medicine

## 2019-12-13 ENCOUNTER — Other Ambulatory Visit: Payer: Self-pay

## 2019-12-13 ENCOUNTER — Encounter: Payer: Self-pay | Admitting: Internal Medicine

## 2019-12-13 DIAGNOSIS — E785 Hyperlipidemia, unspecified: Secondary | ICD-10-CM

## 2020-01-03 ENCOUNTER — Ambulatory Visit
Admission: RE | Admit: 2020-01-03 | Discharge: 2020-01-03 | Disposition: A | Discharge status: Home / Self Care | Payer: Medicare HMO | Source: Ambulatory Visit | Attending: Internal Medicine | Admitting: Internal Medicine

## 2020-01-03 ENCOUNTER — Other Ambulatory Visit: Payer: Self-pay

## 2020-01-03 DIAGNOSIS — Z1231 Encounter for screening mammogram for malignant neoplasm of breast: Secondary | ICD-10-CM | POA: Diagnosis not present

## 2020-01-10 ENCOUNTER — Encounter: Payer: Self-pay | Admitting: Internal Medicine

## 2020-01-10 ENCOUNTER — Ambulatory Visit (HOSPITAL_COMMUNITY)
Admission: RE | Admit: 2020-01-10 | Discharge: 2020-01-10 | Disposition: A | Discharge status: Home / Self Care | Payer: Medicare HMO | Source: Ambulatory Visit | Attending: Cardiovascular Disease | Admitting: Cardiovascular Disease

## 2020-01-10 ENCOUNTER — Other Ambulatory Visit: Payer: Self-pay

## 2020-01-10 DIAGNOSIS — M79604 Pain in right leg: Secondary | ICD-10-CM | POA: Insufficient documentation

## 2020-01-10 DIAGNOSIS — M79605 Pain in left leg: Secondary | ICD-10-CM | POA: Diagnosis not present

## 2020-01-22 DIAGNOSIS — R69 Illness, unspecified: Secondary | ICD-10-CM | POA: Diagnosis not present

## 2020-01-30 DIAGNOSIS — H52223 Regular astigmatism, bilateral: Secondary | ICD-10-CM | POA: Diagnosis not present

## 2020-01-30 DIAGNOSIS — Z0100 Encounter for examination of eyes and vision without abnormal findings: Secondary | ICD-10-CM | POA: Diagnosis not present

## 2020-02-03 DIAGNOSIS — R69 Illness, unspecified: Secondary | ICD-10-CM | POA: Diagnosis not present

## 2020-02-21 ENCOUNTER — Telehealth: Payer: Self-pay | Admitting: Internal Medicine

## 2020-02-21 NOTE — Telephone Encounter (Signed)
Patient scheduled to have a prolia injection on 10.8.21, she had the covid booster shot 10.1.21, and flu shot 3 days ago...  Is it okay to get the Prolia next Friday, or does she need to reschedule

## 2020-02-21 NOTE — Telephone Encounter (Signed)
No this should be fine for the prolia as scheduled

## 2020-02-24 NOTE — Telephone Encounter (Signed)
Left message for patient today with info. 

## 2020-02-28 ENCOUNTER — Other Ambulatory Visit: Payer: Self-pay

## 2020-02-28 ENCOUNTER — Ambulatory Visit (INDEPENDENT_AMBULATORY_CARE_PROVIDER_SITE_OTHER): Payer: Medicare HMO | Admitting: *Deleted

## 2020-02-28 DIAGNOSIS — M810 Age-related osteoporosis without current pathological fracture: Secondary | ICD-10-CM | POA: Diagnosis not present

## 2020-02-28 MED ORDER — DENOSUMAB 60 MG/ML ~~LOC~~ SOSY
60.0000 mg | PREFILLED_SYRINGE | Freq: Once | SUBCUTANEOUS | Status: AC
  Administered 2020-02-28: 60 mg via SUBCUTANEOUS

## 2020-02-28 NOTE — Progress Notes (Signed)
Pls cosign for Prolia inj../lmb  

## 2020-08-24 ENCOUNTER — Ambulatory Visit: Payer: Medicare HMO

## 2020-09-03 NOTE — Telephone Encounter (Signed)
   Please verify Prolia benefit

## 2020-09-04 ENCOUNTER — Ambulatory Visit: Payer: Medicare HMO

## 2020-09-06 IMAGING — MG DIGITAL SCREENING BILAT W/ TOMO W/ CAD
8 series · 9 of 24 positions shown · non-contrast
Comparison: Previous exam(s).

CLINICAL DATA: Screening.

EXAM:
DIGITAL SCREENING BILATERAL MAMMOGRAM WITH TOMO AND CAD

[R MLO synth-2D]
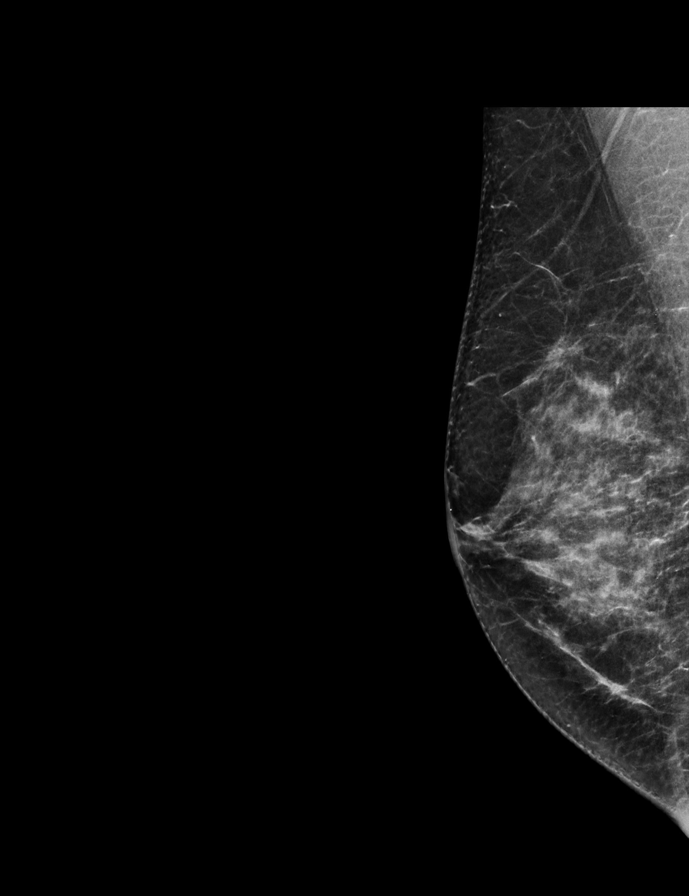

[L MLO synth-2D]
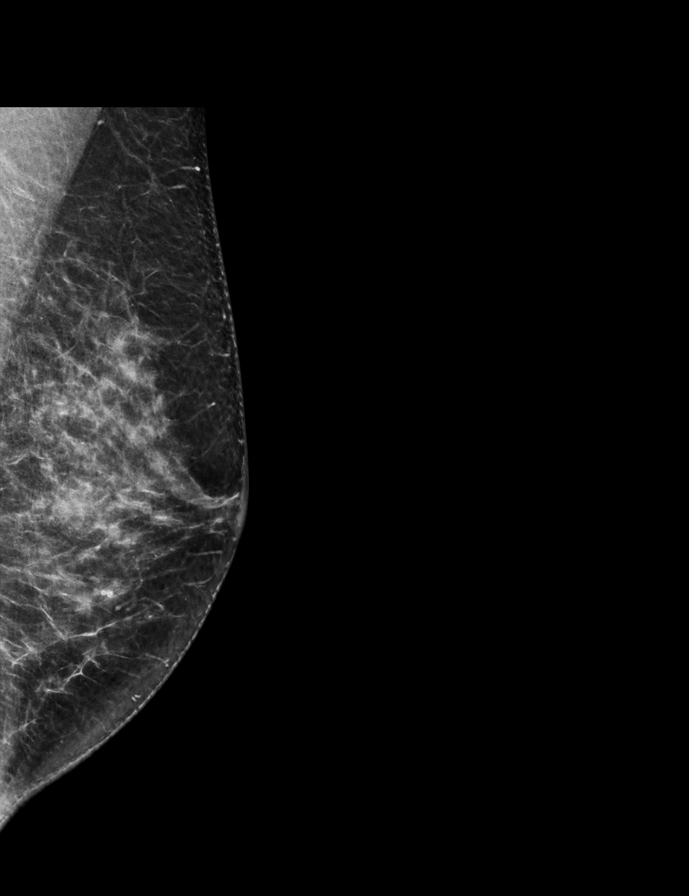

[R CC synth-2D]
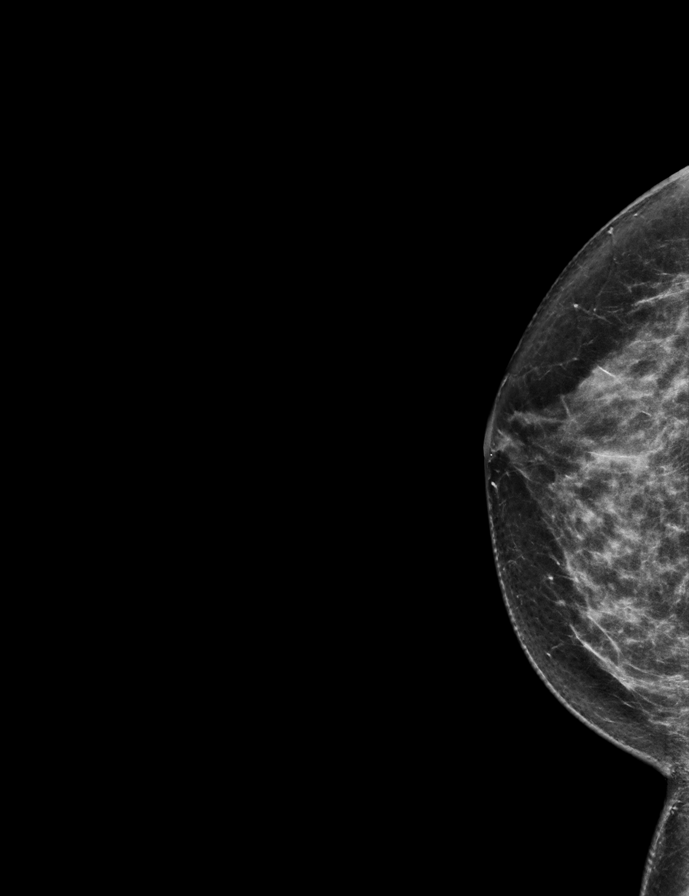

[L CC synth-2D]
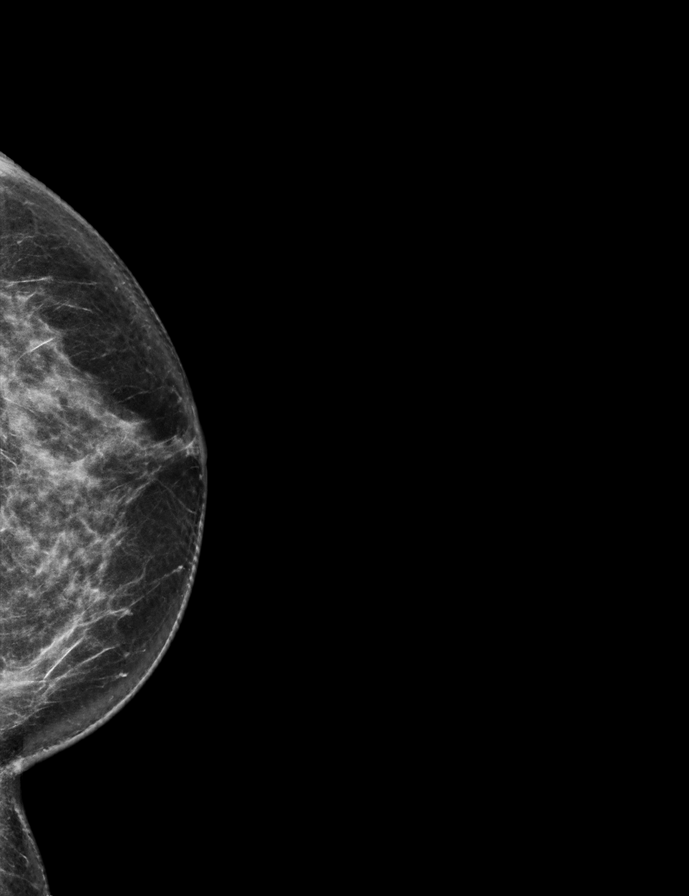

[L CC tomo · 2 of 68 frames shown]
[frame 22/68]
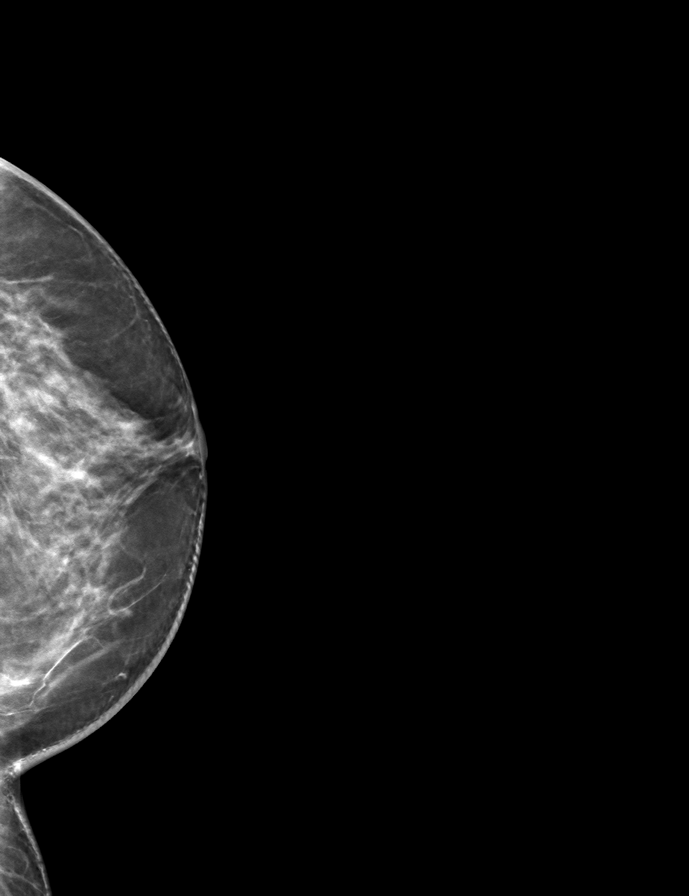
[frame 35/68]
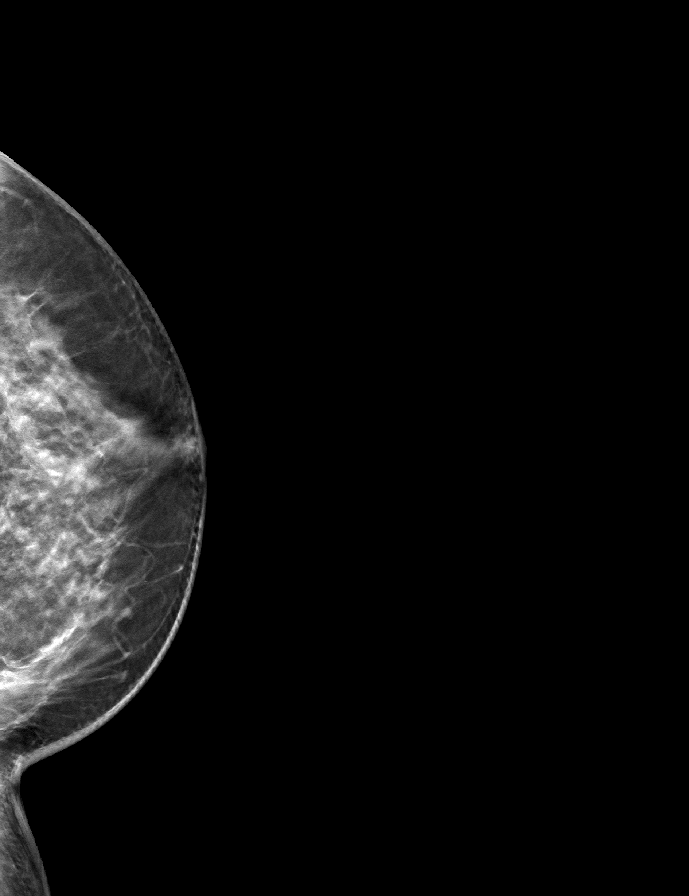

[R CC tomo · tomo slice 33/64.0]
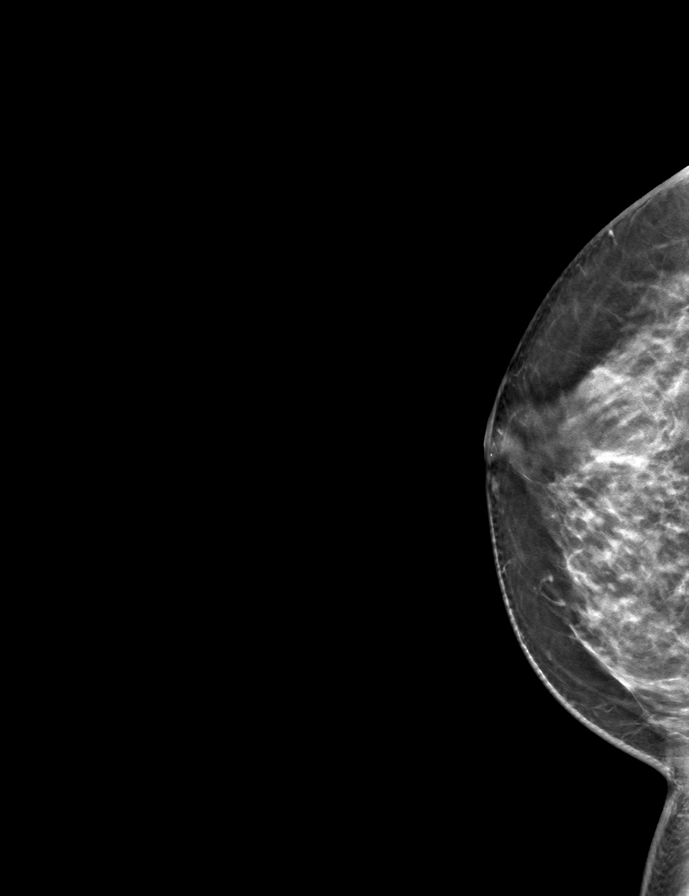

[L MLO tomo · tomo slice 33/65.0]
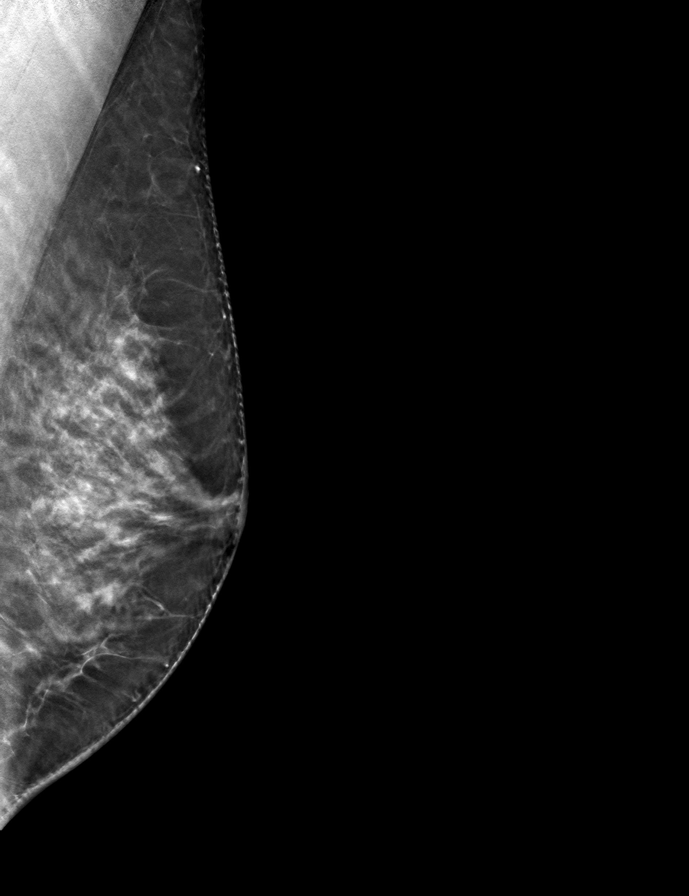

[R MLO tomo · tomo slice 31/61.0]
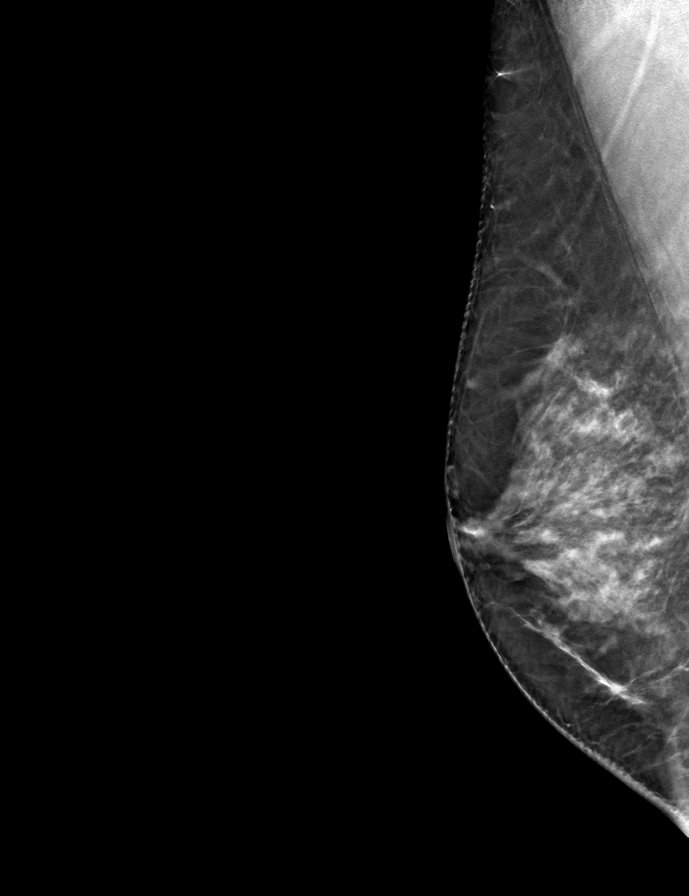

[9 of 24 positions shown; findings below may reference images not displayed]

ACR Breast Density Category c: The breast tissue is heterogeneously
dense, which may obscure small masses.
FINDINGS: There are no findings suspicious for malignancy. Images were
processed with CAD.
IMPRESSION: No mammographic evidence of malignancy. A result letter of this
screening mammogram will be mailed directly to the patient.

RECOMMENDATION:
Screening mammogram in one year. (Code:[id number]-[id number])

BI-RADS CATEGORY  1: Negative.

## 2020-09-07 ENCOUNTER — Ambulatory Visit (INDEPENDENT_AMBULATORY_CARE_PROVIDER_SITE_OTHER): Payer: Medicare HMO

## 2020-09-07 ENCOUNTER — Other Ambulatory Visit: Payer: Self-pay

## 2020-09-07 DIAGNOSIS — M810 Age-related osteoporosis without current pathological fracture: Secondary | ICD-10-CM | POA: Diagnosis not present

## 2020-09-07 MED ORDER — DENOSUMAB 60 MG/ML ~~LOC~~ SOSY
60.0000 mg | PREFILLED_SYRINGE | Freq: Once | SUBCUTANEOUS | Status: AC
  Administered 2020-09-07: 60 mg via SUBCUTANEOUS

## 2020-09-07 NOTE — Progress Notes (Signed)
Pt here for Prolia injection per Dr. Julianne Handler given left arm, sub-q and pt tolerated injection well.

## 2020-11-27 ENCOUNTER — Other Ambulatory Visit (INDEPENDENT_AMBULATORY_CARE_PROVIDER_SITE_OTHER): Payer: Medicare HMO

## 2020-11-27 DIAGNOSIS — Z0001 Encounter for general adult medical examination with abnormal findings: Secondary | ICD-10-CM | POA: Diagnosis not present

## 2020-11-27 DIAGNOSIS — R739 Hyperglycemia, unspecified: Secondary | ICD-10-CM

## 2020-11-27 LAB — CBC WITH DIFFERENTIAL/PLATELET
Basophils Absolute: 0 10*3/uL (ref 0.0–0.1)
Basophils Relative: 0.3 % (ref 0.0–3.0)
Eosinophils Absolute: 0.1 10*3/uL (ref 0.0–0.7)
Eosinophils Relative: 1.3 % (ref 0.0–5.0)
HCT: 39.5 % (ref 36.0–46.0)
Hemoglobin: 13.6 g/dL (ref 12.0–15.0)
Lymphocytes Relative: 36.6 % (ref 12.0–46.0)
Lymphs Abs: 2.1 10*3/uL (ref 0.7–4.0)
MCHC: 34.3 g/dL (ref 30.0–36.0)
MCV: 87 fl (ref 78.0–100.0)
Monocytes Absolute: 0.5 10*3/uL (ref 0.1–1.0)
Monocytes Relative: 9 % (ref 3.0–12.0)
Neutro Abs: 3.1 10*3/uL (ref 1.4–7.7)
Neutrophils Relative %: 52.8 % (ref 43.0–77.0)
Platelets: 190 10*3/uL (ref 150.0–400.0)
RBC: 4.54 Mil/uL (ref 3.87–5.11)
RDW: 12.9 % (ref 11.5–15.5)
WBC: 5.8 10*3/uL (ref 4.0–10.5)

## 2020-11-27 LAB — HEPATIC FUNCTION PANEL
ALT: 22 U/L (ref 0–35)
AST: 21 U/L (ref 0–37)
Albumin: 4.1 g/dL (ref 3.5–5.2)
Alkaline Phosphatase: 36 U/L — ABNORMAL LOW (ref 39–117)
Bilirubin, Direct: 0.1 mg/dL (ref 0.0–0.3)
Total Bilirubin: 0.5 mg/dL (ref 0.2–1.2)
Total Protein: 6.5 g/dL (ref 6.0–8.3)

## 2020-11-27 LAB — LIPID PANEL
Cholesterol: 217 mg/dL — ABNORMAL HIGH (ref 0–200)
HDL: 45.5 mg/dL (ref >39.00)
NonHDL: 171.9
Total CHOL/HDL Ratio: 5
Triglycerides: 287 mg/dL — ABNORMAL HIGH (ref 0.0–149.0)
VLDL: 57.4 mg/dL — ABNORMAL HIGH (ref 0.0–40.0)

## 2020-11-27 LAB — URINALYSIS, ROUTINE W REFLEX MICROSCOPIC
Bilirubin Urine: NEGATIVE
Ketones, ur: NEGATIVE
Leukocytes,Ua: NEGATIVE
Nitrite: NEGATIVE
Specific Gravity, Urine: 1.02 (ref 1.000–1.030)
Total Protein, Urine: NEGATIVE
Urine Glucose: NEGATIVE
Urobilinogen, UA: 0.2 (ref 0.0–1.0)
pH: 6.5 (ref 5.0–8.0)

## 2020-11-27 LAB — BASIC METABOLIC PANEL
BUN: 22 mg/dL (ref 6–23)
CO2: 31 mEq/L (ref 19–32)
Calcium: 9 mg/dL (ref 8.4–10.5)
Chloride: 105 mEq/L (ref 96–112)
Creatinine, Ser: 0.73 mg/dL (ref 0.40–1.20)
GFR: 84.48 mL/min (ref >60.00)
Glucose, Bld: 88 mg/dL (ref 70–99)
Potassium: 4.2 mEq/L (ref 3.5–5.1)
Sodium: 142 mEq/L (ref 135–145)

## 2020-11-27 LAB — LDL CHOLESTEROL, DIRECT: Direct LDL: 103 mg/dL

## 2020-11-27 LAB — HEMOGLOBIN A1C: Hgb A1c MFr Bld: 5.7 % (ref 4.6–6.5)

## 2020-11-27 LAB — TSH: TSH: 1.01 u[IU]/mL (ref 0.35–5.50)

## 2020-11-27 NOTE — Addendum Note (Signed)
Addended by: Jacobo Forest on: 11/27/2020 08:02 AM   Modules accepted: Orders

## 2020-12-04 ENCOUNTER — Encounter: Payer: Self-pay | Admitting: Internal Medicine

## 2020-12-04 ENCOUNTER — Ambulatory Visit (INDEPENDENT_AMBULATORY_CARE_PROVIDER_SITE_OTHER): Payer: Medicare HMO | Admitting: Internal Medicine

## 2020-12-04 ENCOUNTER — Other Ambulatory Visit: Payer: Self-pay

## 2020-12-04 VITALS — BP 120/76 | HR 59 | Temp 98.1°F | Ht 62.0 in | Wt 131.8 lb

## 2020-12-04 DIAGNOSIS — Z0001 Encounter for general adult medical examination with abnormal findings: Secondary | ICD-10-CM

## 2020-12-04 DIAGNOSIS — R739 Hyperglycemia, unspecified: Secondary | ICD-10-CM | POA: Diagnosis not present

## 2020-12-04 DIAGNOSIS — E782 Mixed hyperlipidemia: Secondary | ICD-10-CM

## 2020-12-04 DIAGNOSIS — E538 Deficiency of other specified B group vitamins: Secondary | ICD-10-CM | POA: Diagnosis not present

## 2020-12-04 DIAGNOSIS — R931 Abnormal findings on diagnostic imaging of heart and coronary circulation: Secondary | ICD-10-CM

## 2020-12-04 DIAGNOSIS — M810 Age-related osteoporosis without current pathological fracture: Secondary | ICD-10-CM

## 2020-12-04 DIAGNOSIS — M5416 Radiculopathy, lumbar region: Secondary | ICD-10-CM

## 2020-12-04 DIAGNOSIS — E559 Vitamin D deficiency, unspecified: Secondary | ICD-10-CM | POA: Diagnosis not present

## 2020-12-04 DIAGNOSIS — R3129 Other microscopic hematuria: Secondary | ICD-10-CM

## 2020-12-04 MED ORDER — GABAPENTIN 100 MG PO CAPS: 100.0000 mg | ORAL_CAPSULE | Freq: Three times a day (TID) | ORAL | 5 refills | Status: DC

## 2020-12-04 NOTE — Progress Notes (Signed)
Patient ID: Beverly Shaw, female   DOB: 08-Feb-1951, 70 y.o.   MRN: [id number]         Chief Complaint:: wellness exam and microhematuria, osteoporosis, left lumbar radiculoopathy, dyslipidemia, elevated coronary calcification score       HPI:  Beverly Shaw is a 70 y.o. female here for wellness exam; due for dxa f/u - still taking prolia every 6 months; declines covid booster, o/w up to date with preventive referrals and immunizations                        Also has ongoing pain with bilat hand DJD, and c spine and lumbar DDD and scoliosis.  Has recurring left sided chronic low back pain the worse with left sciatica and has ongoing gabapentin for control, also has sciatic pain occas on the right, and more recently with mild recurring LLE weakness - getting worse in the past 2 months, moderate intermittent  .Marland Kitchen  Still gardening and takes stairs all the time.  Ibuprofen and tylenol and alleve have helped somewhat with pain, tries to avoid other pain medicaiton.  Nothing else makes better and worse.   Still works for [employer] and plans to continue for now  Denies urinary symptoms such as dysuria, frequency, urgency, flank pain, hematuria or n/v, fever, chills.  Pt denies chest pain, increased sob or doe, wheezing, orthopnea, PND, increased LE swelling, palpitations, dizziness or syncope.   Pt denies polydipsia, polyuria, or new focal neuro s/s.   Wt Readings from Last 3 Encounters:  12/04/20 131 lb 12.8 oz (59.8 kg)  11/28/19 134 lb (60.8 kg)  03/08/19 131 lb (59.4 kg)   BP Readings from Last 3 Encounters:  12/04/20 120/76  11/28/19 140/80  03/08/19 140/90   Immunization History  Administered Date(s) Administered   DTaP 09/20/2008   Fluad Quad(high Dose 65+) 02/18/2020   Influenza,inj,Quad PF,6+ Mos 02/04/2014   Influenza-Unspecified 02/04/2014, 03/06/2016   PFIZER(Purple Top)SARS-COV-2 Vaccination 06/12/2019, 07/03/2019, 02/21/2020   Pneumococcal Conjugate-13 11/21/2017   Pneumococcal  Polysaccharide-23 11/27/2018   Tdap 11/27/2018   Zoster Recombinat (Shingrix) 02/09/2018, 02/01/2019   Zoster, Live 10/19/2012  There are no preventive care reminders to display for this patient.    Past Medical History:  Diagnosis Date   Atrophic kidney 11/18/2015   Episodic low back pain    GERD (gastroesophageal reflux disease)    Heart murmur    Migraines    Osteopenia 10/15/2010   Osteoporosis, unspecified 10/19/2012   Positive TB test    Positive TB skin test   Past Surgical History:  Procedure Laterality Date   ABDOMINAL HYSTERECTOMY  1996   TUBAL LIGATION      reports that she has quit smoking. She has never used smokeless tobacco. She reports current alcohol use. She reports that she does not use drugs. family history includes Diabetes in an other family member; Heart disease in her father and mother. Allergies  Allergen Reactions   Yellow Jacket Venom [Bee Venom]    Current Outpatient Medications on File Prior to Visit  Medication Sig Dispense Refill   calcium carbonate (OS-CAL) 600 MG TABS Take 600 mg by mouth daily.       EPINEPHrine 0.3 mg/0.3 mL IJ SOAJ injection Inject 0.3 mLs (0.3 mg total) into the muscle as needed for anaphylaxis. 2 each 1   Multiple Vitamin (MULTIVITAMIN) capsule Take 1 capsule by mouth daily.       No current facility-administered medications on file  prior to visit.        ROS:  All others reviewed and negative.  Objective        PE:  BP 120/76 (BP Location: Left Arm, Patient Position: Sitting, Cuff Size: Normal)   Pulse (!) 59   Temp 98.1 F (36.7 C) (Oral)   Ht 5\' 2"  (1.575 m)   Wt 131 lb 12.8 oz (59.8 kg)   SpO2 99%   BMI 24.11 kg/m                 Constitutional: Pt appears in NAD               HENT: Head: NCAT.                Right Ear: External ear normal.                 Left Ear: External ear normal.                Eyes: . Pupils are equal, round, and reactive to light. Conjunctivae and EOM are normal                Nose: without d/c or deformity               Neck: Neck supple. Gross normal ROM               Cardiovascular: Normal rate and regular rhythm.                 Pulmonary/Chest: Effort normal and breath sounds without rales or wheezing.                Abd:  Soft, NT, ND, + BS, no organomegaly               Neurological: Pt is alert. At baseline orientation, motor grossly intact except 4+/5 LLE motor               Skin: Skin is warm. No rashes, no other new lesions, LE edema - none               Psychiatric: Pt behavior is normal without agitation   Micro: none  Cardiac tracings I have personally interpreted today:  none  Pertinent Radiological findings (summarize): none   Lab Results  Component Value Date   WBC 5.8 11/27/2020   HGB 13.6 11/27/2020   HCT 39.5 11/27/2020   PLT 190.0 11/27/2020   GLUCOSE 88 11/27/2020   CHOL 217 (H) 11/27/2020   TRIG 287.0 (H) 11/27/2020   HDL 45.50 11/27/2020   LDLDIRECT 103.0 11/27/2020   LDLCALC 132 (H) 11/15/2017   ALT 22 11/27/2020   AST 21 11/27/2020   NA 142 11/27/2020   K 4.2 11/27/2020   CL 105 11/27/2020   CREATININE 0.73 11/27/2020   BUN 22 11/27/2020   CO2 31 11/27/2020   TSH 1.01 11/27/2020   HGBA1C 5.7 11/27/2020   Assessment/Plan:  DAKOTA STANGL is a 70 y.o. White or Caucasian [1] female with  has a past medical history of Atrophic kidney (11/18/2015), Episodic low back pain, GERD (gastroesophageal reflux disease), Heart murmur, Migraines, Osteopenia (10/15/2010), Osteoporosis, unspecified (10/19/2012), and Positive TB test.  Encounter for well adult exam with abnormal findings Age and sex appropriate education and counseling updated with regular exercise and diet Referrals for preventative services - none needed Immunizations addressed - declines covid booster Smoking counseling  - none needed Evidence for depression or other mood disorder -  none significant Most recent labs reviewed. I have personally reviewed and have  noted: 1) the patient's medical and social history 2) The patient's current medications and supplements 3) The patient's height, weight, and BMI have been recorded in the chart   Osteoporosis For contd prolia, and or f/u dxa  Left lumbar radiculopathy With recent mild worsening, for LS spine MRI  Microhematuria Chronic stable, declines urology f/u for now  Hyperlipidemia With elevated TG as well - cont fish oild, declines statin or fenofibrate for now  Hyperglycemia Lab Results  Component Value Date   HGBA1C 5.7 11/27/2020   Stable, pt to continue current medical treatment  - diet   Elevated coronary artery calcium score Very mild at 14 in 2021; declines statin, to continue low chol diet  Followup: Return in about 1 year (around 12/04/2021).  Cathlean Cower, MD 12/06/2020 9:14 AM [facility]

## 2020-12-04 NOTE — Patient Instructions (Signed)
Please schedule the bone density test before leaving today at the scheduling desk (where you check out)  Please continue all other medications as before, and refills have been done if requested.  Please have the pharmacy call with any other refills you may need.  Please continue your efforts at being more active, low cholesterol diet, and weight control.  You are otherwise up to date with prevention measures today.  Please keep your appointments with your specialists as you may have planned  You will be contacted regarding the referral for: MRI for the lower back  Please make an Appointment to return for your 1 year visit, or sooner if needed, with Lab testing by Appointment as well, to be done about 3-5 days before at the [place] (so this is for TWO appointments - please see the scheduling desk as you leave)   Due to the ongoing Covid 19 pandemic, our lab now requires an appointment for any labs done at our office.  If you need labs done and do not have an appointment, please call our office ahead of time to schedule before presenting to the lab for your testing.

## 2020-12-06 ENCOUNTER — Encounter: Payer: Self-pay | Admitting: Internal Medicine

## 2020-12-06 DIAGNOSIS — R931 Abnormal findings on diagnostic imaging of heart and coronary circulation: Secondary | ICD-10-CM | POA: Insufficient documentation

## 2020-12-06 NOTE — Assessment & Plan Note (Signed)

## 2020-12-06 NOTE — Assessment & Plan Note (Signed)
For contd prolia, and or f/u dxa

## 2020-12-06 NOTE — Assessment & Plan Note (Signed)
Chronic stable, declines urology f/u for now

## 2020-12-06 NOTE — Assessment & Plan Note (Signed)
Lab Results  Component Value Date   HGBA1C 5.7 11/27/2020   Stable, pt to continue current medical treatment  - diet

## 2020-12-06 NOTE — Assessment & Plan Note (Signed)
With recent mild worsening, for LS spine MRI

## 2020-12-06 NOTE — Assessment & Plan Note (Signed)
With elevated TG as well - cont fish oild, declines statin or fenofibrate for now

## 2020-12-06 NOTE — Assessment & Plan Note (Signed)
Very mild at 51 in 2021; declines statin, to continue low chol diet

## 2020-12-09 ENCOUNTER — Telehealth: Payer: Self-pay | Admitting: Internal Medicine

## 2020-12-09 NOTE — Telephone Encounter (Signed)
Patient states Gabapentin making her feel sluggish and tired  Patient calling for advice on gabapentin (NEURONTIN) 100 MG capsule

## 2020-12-09 NOTE — Telephone Encounter (Signed)
Hmmm, that is unfortunate.  We can try Lyrica low dose, but there is a chance for different side effect of bad dreams and anxiety, or try even cymbalta 30 mg as this is for nerve pain, as well arthritis pain, as well as anxiety and depression (a very unusual medication)- the cymbalta may not work as well but has less chance of side effect

## 2020-12-10 MED ORDER — DULOXETINE HCL 30 MG PO CPEP
30.0000 mg | ORAL_CAPSULE | Freq: Every day | ORAL | 3 refills | Status: DC
Start: 2020-12-10 — End: 2021-12-10

## 2020-12-10 NOTE — Telephone Encounter (Signed)
Ok [facility] 

## 2020-12-11 ENCOUNTER — Other Ambulatory Visit: Payer: Medicare HMO

## 2020-12-11 ENCOUNTER — Ambulatory Visit (INDEPENDENT_AMBULATORY_CARE_PROVIDER_SITE_OTHER)
Admission: RE | Admit: 2020-12-11 | Discharge: 2020-12-11 | Disposition: A | Discharge status: Home / Self Care | Payer: Medicare HMO | Source: Ambulatory Visit | Attending: Internal Medicine | Admitting: Internal Medicine

## 2020-12-11 DIAGNOSIS — M810 Age-related osteoporosis without current pathological fracture: Secondary | ICD-10-CM | POA: Diagnosis not present

## 2020-12-13 ENCOUNTER — Encounter: Payer: Self-pay | Admitting: Internal Medicine

## 2020-12-14 ENCOUNTER — Other Ambulatory Visit: Payer: Self-pay | Admitting: Internal Medicine

## 2020-12-14 DIAGNOSIS — Z1231 Encounter for screening mammogram for malignant neoplasm of breast: Secondary | ICD-10-CM

## 2021-01-22 DIAGNOSIS — L821 Other seborrheic keratosis: Secondary | ICD-10-CM | POA: Diagnosis not present

## 2021-01-22 DIAGNOSIS — L814 Other melanin hyperpigmentation: Secondary | ICD-10-CM | POA: Diagnosis not present

## 2021-01-22 DIAGNOSIS — R202 Paresthesia of skin: Secondary | ICD-10-CM | POA: Diagnosis not present

## 2021-01-22 DIAGNOSIS — D2271 Melanocytic nevi of right lower limb, including hip: Secondary | ICD-10-CM | POA: Diagnosis not present

## 2021-01-22 DIAGNOSIS — L578 Other skin changes due to chronic exposure to nonionizing radiation: Secondary | ICD-10-CM | POA: Diagnosis not present

## 2021-02-05 ENCOUNTER — Ambulatory Visit
Admission: RE | Admit: 2021-02-05 | Discharge: 2021-02-05 | Disposition: A | Discharge status: Home / Self Care | Payer: Medicare HMO | Source: Ambulatory Visit | Attending: Internal Medicine | Admitting: Internal Medicine

## 2021-02-05 ENCOUNTER — Other Ambulatory Visit: Payer: Self-pay

## 2021-02-05 DIAGNOSIS — Z1231 Encounter for screening mammogram for malignant neoplasm of breast: Secondary | ICD-10-CM

## 2021-02-16 ENCOUNTER — Telehealth: Payer: Self-pay

## 2021-02-16 NOTE — Telephone Encounter (Signed)
Prolia VOB initiated via [email or web address]  Last OV:  Next OV:  Last Prolia inj: 09/07/20 Next Prolia inj DUE: 03/10/21

## 2021-02-25 DIAGNOSIS — H5203 Hypermetropia, bilateral: Secondary | ICD-10-CM | POA: Diagnosis not present

## 2021-02-25 NOTE — Telephone Encounter (Signed)
Prior Auth required for Prolia  PA PROCESS DETAILS: PA is required. Call [phone] or complete the PA form available at [email or web address]

## 2021-03-05 NOTE — Telephone Encounter (Signed)
Pt ready for scheduling on or after 03/10/21  Out-of-pocket cost due at time of visit: $295  Primary: [employer] Prolia co-insurance: 20% (approximately $270) Admin fee co-insurance: 20% (approximately $25)  Secondary: n/a Prolia co-insurance:  Admin fee co-insurance:   Deductible: does not apply  Prior Auth: APPROVED PA# [id number] Valid: 03/05/21-03/04/22  ** This summary of benefits is an estimation of the patient's out-of-pocket cost. Exact cost may vary based on individual plan coverage.

## 2021-03-11 ENCOUNTER — Ambulatory Visit (INDEPENDENT_AMBULATORY_CARE_PROVIDER_SITE_OTHER): Payer: Medicare HMO

## 2021-03-11 ENCOUNTER — Other Ambulatory Visit: Payer: Self-pay

## 2021-03-11 DIAGNOSIS — M810 Age-related osteoporosis without current pathological fracture: Secondary | ICD-10-CM | POA: Diagnosis not present

## 2021-03-11 DIAGNOSIS — E559 Vitamin D deficiency, unspecified: Secondary | ICD-10-CM | POA: Diagnosis not present

## 2021-03-11 MED ORDER — DENOSUMAB 60 MG/ML ~~LOC~~ SOSY
60.0000 mg | PREFILLED_SYRINGE | Freq: Once | SUBCUTANEOUS | Status: AC
  Administered 2021-03-11: 60 mg via SUBCUTANEOUS

## 2021-03-11 NOTE — Progress Notes (Signed)
Pt here for Prolia injection per Dr. Jenny Reichmann  Pt tolerated injection well.  Next prolia injection scheduled for 09/10/21

## 2021-03-12 NOTE — Telephone Encounter (Signed)
Pt received Prolia inj 03/11/21 Next Prolia inj due 09/10/20

## 2021-03-30 DIAGNOSIS — M9904 Segmental and somatic dysfunction of sacral region: Secondary | ICD-10-CM | POA: Diagnosis not present

## 2021-03-30 DIAGNOSIS — M5136 Other intervertebral disc degeneration, lumbar region: Secondary | ICD-10-CM | POA: Diagnosis not present

## 2021-03-30 DIAGNOSIS — M5442 Lumbago with sciatica, left side: Secondary | ICD-10-CM | POA: Diagnosis not present

## 2021-03-30 DIAGNOSIS — M5137 Other intervertebral disc degeneration, lumbosacral region: Secondary | ICD-10-CM | POA: Diagnosis not present

## 2021-03-30 DIAGNOSIS — M9903 Segmental and somatic dysfunction of lumbar region: Secondary | ICD-10-CM | POA: Diagnosis not present

## 2021-03-31 DIAGNOSIS — M5136 Other intervertebral disc degeneration, lumbar region: Secondary | ICD-10-CM | POA: Diagnosis not present

## 2021-03-31 DIAGNOSIS — M9904 Segmental and somatic dysfunction of sacral region: Secondary | ICD-10-CM | POA: Diagnosis not present

## 2021-03-31 DIAGNOSIS — M9903 Segmental and somatic dysfunction of lumbar region: Secondary | ICD-10-CM | POA: Diagnosis not present

## 2021-03-31 DIAGNOSIS — M5442 Lumbago with sciatica, left side: Secondary | ICD-10-CM | POA: Diagnosis not present

## 2021-03-31 DIAGNOSIS — M5137 Other intervertebral disc degeneration, lumbosacral region: Secondary | ICD-10-CM | POA: Diagnosis not present

## 2021-04-02 DIAGNOSIS — M9903 Segmental and somatic dysfunction of lumbar region: Secondary | ICD-10-CM | POA: Diagnosis not present

## 2021-04-02 DIAGNOSIS — M5137 Other intervertebral disc degeneration, lumbosacral region: Secondary | ICD-10-CM | POA: Diagnosis not present

## 2021-04-02 DIAGNOSIS — M5136 Other intervertebral disc degeneration, lumbar region: Secondary | ICD-10-CM | POA: Diagnosis not present

## 2021-04-02 DIAGNOSIS — M5442 Lumbago with sciatica, left side: Secondary | ICD-10-CM | POA: Diagnosis not present

## 2021-04-02 DIAGNOSIS — M9904 Segmental and somatic dysfunction of sacral region: Secondary | ICD-10-CM | POA: Diagnosis not present

## 2021-04-05 DIAGNOSIS — M5442 Lumbago with sciatica, left side: Secondary | ICD-10-CM | POA: Diagnosis not present

## 2021-04-05 DIAGNOSIS — M9903 Segmental and somatic dysfunction of lumbar region: Secondary | ICD-10-CM | POA: Diagnosis not present

## 2021-04-05 DIAGNOSIS — M5137 Other intervertebral disc degeneration, lumbosacral region: Secondary | ICD-10-CM | POA: Diagnosis not present

## 2021-04-05 DIAGNOSIS — M9904 Segmental and somatic dysfunction of sacral region: Secondary | ICD-10-CM | POA: Diagnosis not present

## 2021-04-05 DIAGNOSIS — M5136 Other intervertebral disc degeneration, lumbar region: Secondary | ICD-10-CM | POA: Diagnosis not present

## 2021-04-07 DIAGNOSIS — M9904 Segmental and somatic dysfunction of sacral region: Secondary | ICD-10-CM | POA: Diagnosis not present

## 2021-04-07 DIAGNOSIS — M5137 Other intervertebral disc degeneration, lumbosacral region: Secondary | ICD-10-CM | POA: Diagnosis not present

## 2021-04-07 DIAGNOSIS — M5136 Other intervertebral disc degeneration, lumbar region: Secondary | ICD-10-CM | POA: Diagnosis not present

## 2021-04-07 DIAGNOSIS — M9903 Segmental and somatic dysfunction of lumbar region: Secondary | ICD-10-CM | POA: Diagnosis not present

## 2021-04-07 DIAGNOSIS — M5442 Lumbago with sciatica, left side: Secondary | ICD-10-CM | POA: Diagnosis not present

## 2021-04-09 DIAGNOSIS — M9903 Segmental and somatic dysfunction of lumbar region: Secondary | ICD-10-CM | POA: Diagnosis not present

## 2021-04-09 DIAGNOSIS — M5137 Other intervertebral disc degeneration, lumbosacral region: Secondary | ICD-10-CM | POA: Diagnosis not present

## 2021-04-09 DIAGNOSIS — M5136 Other intervertebral disc degeneration, lumbar region: Secondary | ICD-10-CM | POA: Diagnosis not present

## 2021-04-09 DIAGNOSIS — M9904 Segmental and somatic dysfunction of sacral region: Secondary | ICD-10-CM | POA: Diagnosis not present

## 2021-04-09 DIAGNOSIS — M5442 Lumbago with sciatica, left side: Secondary | ICD-10-CM | POA: Diagnosis not present

## 2021-04-12 DIAGNOSIS — M9903 Segmental and somatic dysfunction of lumbar region: Secondary | ICD-10-CM | POA: Diagnosis not present

## 2021-04-12 DIAGNOSIS — M5136 Other intervertebral disc degeneration, lumbar region: Secondary | ICD-10-CM | POA: Diagnosis not present

## 2021-04-12 DIAGNOSIS — M5137 Other intervertebral disc degeneration, lumbosacral region: Secondary | ICD-10-CM | POA: Diagnosis not present

## 2021-04-12 DIAGNOSIS — M5442 Lumbago with sciatica, left side: Secondary | ICD-10-CM | POA: Diagnosis not present

## 2021-04-12 DIAGNOSIS — M9904 Segmental and somatic dysfunction of sacral region: Secondary | ICD-10-CM | POA: Diagnosis not present

## 2021-04-13 DIAGNOSIS — M9903 Segmental and somatic dysfunction of lumbar region: Secondary | ICD-10-CM | POA: Diagnosis not present

## 2021-04-13 DIAGNOSIS — M5137 Other intervertebral disc degeneration, lumbosacral region: Secondary | ICD-10-CM | POA: Diagnosis not present

## 2021-04-13 DIAGNOSIS — M9904 Segmental and somatic dysfunction of sacral region: Secondary | ICD-10-CM | POA: Diagnosis not present

## 2021-04-13 DIAGNOSIS — M5136 Other intervertebral disc degeneration, lumbar region: Secondary | ICD-10-CM | POA: Diagnosis not present

## 2021-04-13 DIAGNOSIS — M5442 Lumbago with sciatica, left side: Secondary | ICD-10-CM | POA: Diagnosis not present

## 2021-04-14 DIAGNOSIS — M9903 Segmental and somatic dysfunction of lumbar region: Secondary | ICD-10-CM | POA: Diagnosis not present

## 2021-04-14 DIAGNOSIS — M5442 Lumbago with sciatica, left side: Secondary | ICD-10-CM | POA: Diagnosis not present

## 2021-04-14 DIAGNOSIS — M5137 Other intervertebral disc degeneration, lumbosacral region: Secondary | ICD-10-CM | POA: Diagnosis not present

## 2021-04-14 DIAGNOSIS — M5136 Other intervertebral disc degeneration, lumbar region: Secondary | ICD-10-CM | POA: Diagnosis not present

## 2021-04-14 DIAGNOSIS — M9904 Segmental and somatic dysfunction of sacral region: Secondary | ICD-10-CM | POA: Diagnosis not present

## 2021-04-19 DIAGNOSIS — M5136 Other intervertebral disc degeneration, lumbar region: Secondary | ICD-10-CM | POA: Diagnosis not present

## 2021-04-19 DIAGNOSIS — M5137 Other intervertebral disc degeneration, lumbosacral region: Secondary | ICD-10-CM | POA: Diagnosis not present

## 2021-04-19 DIAGNOSIS — M9903 Segmental and somatic dysfunction of lumbar region: Secondary | ICD-10-CM | POA: Diagnosis not present

## 2021-04-19 DIAGNOSIS — M5442 Lumbago with sciatica, left side: Secondary | ICD-10-CM | POA: Diagnosis not present

## 2021-04-19 DIAGNOSIS — M9904 Segmental and somatic dysfunction of sacral region: Secondary | ICD-10-CM | POA: Diagnosis not present

## 2021-04-21 DIAGNOSIS — M5136 Other intervertebral disc degeneration, lumbar region: Secondary | ICD-10-CM | POA: Diagnosis not present

## 2021-04-21 DIAGNOSIS — M5137 Other intervertebral disc degeneration, lumbosacral region: Secondary | ICD-10-CM | POA: Diagnosis not present

## 2021-04-21 DIAGNOSIS — M9904 Segmental and somatic dysfunction of sacral region: Secondary | ICD-10-CM | POA: Diagnosis not present

## 2021-04-21 DIAGNOSIS — M9903 Segmental and somatic dysfunction of lumbar region: Secondary | ICD-10-CM | POA: Diagnosis not present

## 2021-04-21 DIAGNOSIS — M5442 Lumbago with sciatica, left side: Secondary | ICD-10-CM | POA: Diagnosis not present

## 2021-04-23 DIAGNOSIS — M5137 Other intervertebral disc degeneration, lumbosacral region: Secondary | ICD-10-CM | POA: Diagnosis not present

## 2021-04-23 DIAGNOSIS — M5136 Other intervertebral disc degeneration, lumbar region: Secondary | ICD-10-CM | POA: Diagnosis not present

## 2021-04-23 DIAGNOSIS — M9903 Segmental and somatic dysfunction of lumbar region: Secondary | ICD-10-CM | POA: Diagnosis not present

## 2021-04-23 DIAGNOSIS — M5442 Lumbago with sciatica, left side: Secondary | ICD-10-CM | POA: Diagnosis not present

## 2021-04-23 DIAGNOSIS — M9904 Segmental and somatic dysfunction of sacral region: Secondary | ICD-10-CM | POA: Diagnosis not present

## 2021-04-24 NOTE — Telephone Encounter (Signed)
Last Prolia inj 03/11/21 Next Prolia inj due 09/10/21

## 2021-04-26 DIAGNOSIS — M9904 Segmental and somatic dysfunction of sacral region: Secondary | ICD-10-CM | POA: Diagnosis not present

## 2021-04-26 DIAGNOSIS — M5137 Other intervertebral disc degeneration, lumbosacral region: Secondary | ICD-10-CM | POA: Diagnosis not present

## 2021-04-26 DIAGNOSIS — M9903 Segmental and somatic dysfunction of lumbar region: Secondary | ICD-10-CM | POA: Diagnosis not present

## 2021-04-26 DIAGNOSIS — M5442 Lumbago with sciatica, left side: Secondary | ICD-10-CM | POA: Diagnosis not present

## 2021-04-26 DIAGNOSIS — M5136 Other intervertebral disc degeneration, lumbar region: Secondary | ICD-10-CM | POA: Diagnosis not present

## 2021-04-28 DIAGNOSIS — M9903 Segmental and somatic dysfunction of lumbar region: Secondary | ICD-10-CM | POA: Diagnosis not present

## 2021-04-28 DIAGNOSIS — M5136 Other intervertebral disc degeneration, lumbar region: Secondary | ICD-10-CM | POA: Diagnosis not present

## 2021-04-28 DIAGNOSIS — M9904 Segmental and somatic dysfunction of sacral region: Secondary | ICD-10-CM | POA: Diagnosis not present

## 2021-04-28 DIAGNOSIS — M5137 Other intervertebral disc degeneration, lumbosacral region: Secondary | ICD-10-CM | POA: Diagnosis not present

## 2021-04-28 DIAGNOSIS — M5442 Lumbago with sciatica, left side: Secondary | ICD-10-CM | POA: Diagnosis not present

## 2021-04-30 DIAGNOSIS — M9904 Segmental and somatic dysfunction of sacral region: Secondary | ICD-10-CM | POA: Diagnosis not present

## 2021-04-30 DIAGNOSIS — M9903 Segmental and somatic dysfunction of lumbar region: Secondary | ICD-10-CM | POA: Diagnosis not present

## 2021-04-30 DIAGNOSIS — M5137 Other intervertebral disc degeneration, lumbosacral region: Secondary | ICD-10-CM | POA: Diagnosis not present

## 2021-04-30 DIAGNOSIS — M5442 Lumbago with sciatica, left side: Secondary | ICD-10-CM | POA: Diagnosis not present

## 2021-04-30 DIAGNOSIS — M5136 Other intervertebral disc degeneration, lumbar region: Secondary | ICD-10-CM | POA: Diagnosis not present

## 2021-05-07 DIAGNOSIS — M9904 Segmental and somatic dysfunction of sacral region: Secondary | ICD-10-CM | POA: Diagnosis not present

## 2021-05-07 DIAGNOSIS — M5442 Lumbago with sciatica, left side: Secondary | ICD-10-CM | POA: Diagnosis not present

## 2021-05-07 DIAGNOSIS — M5136 Other intervertebral disc degeneration, lumbar region: Secondary | ICD-10-CM | POA: Diagnosis not present

## 2021-05-07 DIAGNOSIS — M5137 Other intervertebral disc degeneration, lumbosacral region: Secondary | ICD-10-CM | POA: Diagnosis not present

## 2021-05-07 DIAGNOSIS — M9903 Segmental and somatic dysfunction of lumbar region: Secondary | ICD-10-CM | POA: Diagnosis not present

## 2021-05-14 DIAGNOSIS — M9903 Segmental and somatic dysfunction of lumbar region: Secondary | ICD-10-CM | POA: Diagnosis not present

## 2021-05-14 DIAGNOSIS — M9904 Segmental and somatic dysfunction of sacral region: Secondary | ICD-10-CM | POA: Diagnosis not present

## 2021-05-14 DIAGNOSIS — M5137 Other intervertebral disc degeneration, lumbosacral region: Secondary | ICD-10-CM | POA: Diagnosis not present

## 2021-05-14 DIAGNOSIS — M5442 Lumbago with sciatica, left side: Secondary | ICD-10-CM | POA: Diagnosis not present

## 2021-05-14 DIAGNOSIS — M5136 Other intervertebral disc degeneration, lumbar region: Secondary | ICD-10-CM | POA: Diagnosis not present

## 2021-05-28 DIAGNOSIS — M9904 Segmental and somatic dysfunction of sacral region: Secondary | ICD-10-CM | POA: Diagnosis not present

## 2021-05-28 DIAGNOSIS — M5137 Other intervertebral disc degeneration, lumbosacral region: Secondary | ICD-10-CM | POA: Diagnosis not present

## 2021-05-28 DIAGNOSIS — M9903 Segmental and somatic dysfunction of lumbar region: Secondary | ICD-10-CM | POA: Diagnosis not present

## 2021-05-28 DIAGNOSIS — M5442 Lumbago with sciatica, left side: Secondary | ICD-10-CM | POA: Diagnosis not present

## 2021-05-28 DIAGNOSIS — M5136 Other intervertebral disc degeneration, lumbar region: Secondary | ICD-10-CM | POA: Diagnosis not present

## 2021-06-04 DIAGNOSIS — M5442 Lumbago with sciatica, left side: Secondary | ICD-10-CM | POA: Diagnosis not present

## 2021-06-04 DIAGNOSIS — M9903 Segmental and somatic dysfunction of lumbar region: Secondary | ICD-10-CM | POA: Diagnosis not present

## 2021-06-04 DIAGNOSIS — M9904 Segmental and somatic dysfunction of sacral region: Secondary | ICD-10-CM | POA: Diagnosis not present

## 2021-06-04 DIAGNOSIS — M5136 Other intervertebral disc degeneration, lumbar region: Secondary | ICD-10-CM | POA: Diagnosis not present

## 2021-06-04 DIAGNOSIS — M5137 Other intervertebral disc degeneration, lumbosacral region: Secondary | ICD-10-CM | POA: Diagnosis not present

## 2021-06-11 DIAGNOSIS — Z01419 Encounter for gynecological examination (general) (routine) without abnormal findings: Secondary | ICD-10-CM | POA: Diagnosis not present

## 2021-06-11 DIAGNOSIS — Z90710 Acquired absence of both cervix and uterus: Secondary | ICD-10-CM | POA: Diagnosis not present

## 2021-06-11 DIAGNOSIS — Z8742 Personal history of other diseases of the female genital tract: Secondary | ICD-10-CM | POA: Diagnosis not present

## 2021-06-11 DIAGNOSIS — Z1272 Encounter for screening for malignant neoplasm of vagina: Secondary | ICD-10-CM | POA: Diagnosis not present

## 2021-06-18 DIAGNOSIS — M5136 Other intervertebral disc degeneration, lumbar region: Secondary | ICD-10-CM | POA: Diagnosis not present

## 2021-06-18 DIAGNOSIS — M5137 Other intervertebral disc degeneration, lumbosacral region: Secondary | ICD-10-CM | POA: Diagnosis not present

## 2021-06-18 DIAGNOSIS — M9904 Segmental and somatic dysfunction of sacral region: Secondary | ICD-10-CM | POA: Diagnosis not present

## 2021-06-18 DIAGNOSIS — M9903 Segmental and somatic dysfunction of lumbar region: Secondary | ICD-10-CM | POA: Diagnosis not present

## 2021-06-18 DIAGNOSIS — M5442 Lumbago with sciatica, left side: Secondary | ICD-10-CM | POA: Diagnosis not present

## 2021-07-02 DIAGNOSIS — M9903 Segmental and somatic dysfunction of lumbar region: Secondary | ICD-10-CM | POA: Diagnosis not present

## 2021-07-02 DIAGNOSIS — M5137 Other intervertebral disc degeneration, lumbosacral region: Secondary | ICD-10-CM | POA: Diagnosis not present

## 2021-07-02 DIAGNOSIS — M5442 Lumbago with sciatica, left side: Secondary | ICD-10-CM | POA: Diagnosis not present

## 2021-07-02 DIAGNOSIS — M9904 Segmental and somatic dysfunction of sacral region: Secondary | ICD-10-CM | POA: Diagnosis not present

## 2021-07-02 DIAGNOSIS — M5136 Other intervertebral disc degeneration, lumbar region: Secondary | ICD-10-CM | POA: Diagnosis not present

## 2021-07-16 DIAGNOSIS — M5137 Other intervertebral disc degeneration, lumbosacral region: Secondary | ICD-10-CM | POA: Diagnosis not present

## 2021-07-16 DIAGNOSIS — M5442 Lumbago with sciatica, left side: Secondary | ICD-10-CM | POA: Diagnosis not present

## 2021-07-16 DIAGNOSIS — M9903 Segmental and somatic dysfunction of lumbar region: Secondary | ICD-10-CM | POA: Diagnosis not present

## 2021-07-16 DIAGNOSIS — M5136 Other intervertebral disc degeneration, lumbar region: Secondary | ICD-10-CM | POA: Diagnosis not present

## 2021-07-16 DIAGNOSIS — M9904 Segmental and somatic dysfunction of sacral region: Secondary | ICD-10-CM | POA: Diagnosis not present

## 2021-07-22 NOTE — Telephone Encounter (Signed)
Pt ready for scheduling on or after 09/10/21 ? ?Out-of-pocket cost due at time of visit: $301 ? ?Primary: [employer] Medicare ?Prolia co-insurance: 20% (approximately $276) ?Admin fee co-insurance: 20% (approximately $25) ? ?Secondary: n/a ?Prolia co-insurance:  ?Admin fee co-insurance:  ? ?Deductible: does not apply ? ?Prior Auth: APPROVED ?PA# [id number] ?Valid: 03/05/21-03/04/22 ? ?** This summary of benefits is an estimation of the patient's out-of-pocket cost. Exact cost may vary based on individual plan coverage.  ? ?

## 2021-07-30 DIAGNOSIS — M5442 Lumbago with sciatica, left side: Secondary | ICD-10-CM | POA: Diagnosis not present

## 2021-07-30 DIAGNOSIS — M9904 Segmental and somatic dysfunction of sacral region: Secondary | ICD-10-CM | POA: Diagnosis not present

## 2021-07-30 DIAGNOSIS — M9903 Segmental and somatic dysfunction of lumbar region: Secondary | ICD-10-CM | POA: Diagnosis not present

## 2021-07-30 DIAGNOSIS — M5136 Other intervertebral disc degeneration, lumbar region: Secondary | ICD-10-CM | POA: Diagnosis not present

## 2021-07-30 DIAGNOSIS — M5137 Other intervertebral disc degeneration, lumbosacral region: Secondary | ICD-10-CM | POA: Diagnosis not present

## 2021-08-13 DIAGNOSIS — M9903 Segmental and somatic dysfunction of lumbar region: Secondary | ICD-10-CM | POA: Diagnosis not present

## 2021-08-13 DIAGNOSIS — M9904 Segmental and somatic dysfunction of sacral region: Secondary | ICD-10-CM | POA: Diagnosis not present

## 2021-08-13 DIAGNOSIS — M5442 Lumbago with sciatica, left side: Secondary | ICD-10-CM | POA: Diagnosis not present

## 2021-08-13 DIAGNOSIS — M5137 Other intervertebral disc degeneration, lumbosacral region: Secondary | ICD-10-CM | POA: Diagnosis not present

## 2021-08-13 DIAGNOSIS — M5136 Other intervertebral disc degeneration, lumbar region: Secondary | ICD-10-CM | POA: Diagnosis not present

## 2021-08-27 DIAGNOSIS — M9903 Segmental and somatic dysfunction of lumbar region: Secondary | ICD-10-CM | POA: Diagnosis not present

## 2021-08-27 DIAGNOSIS — M5136 Other intervertebral disc degeneration, lumbar region: Secondary | ICD-10-CM | POA: Diagnosis not present

## 2021-08-27 DIAGNOSIS — M9904 Segmental and somatic dysfunction of sacral region: Secondary | ICD-10-CM | POA: Diagnosis not present

## 2021-08-27 DIAGNOSIS — M5442 Lumbago with sciatica, left side: Secondary | ICD-10-CM | POA: Diagnosis not present

## 2021-08-27 DIAGNOSIS — M5137 Other intervertebral disc degeneration, lumbosacral region: Secondary | ICD-10-CM | POA: Diagnosis not present

## 2021-08-30 ENCOUNTER — Ambulatory Visit (INDEPENDENT_AMBULATORY_CARE_PROVIDER_SITE_OTHER): Payer: Medicare HMO

## 2021-08-30 ENCOUNTER — Ambulatory Visit (INDEPENDENT_AMBULATORY_CARE_PROVIDER_SITE_OTHER): Payer: Medicare HMO | Admitting: Sports Medicine

## 2021-08-30 VITALS — BP 140/80 | HR 64 | Ht 62.0 in | Wt 133.0 lb

## 2021-08-30 DIAGNOSIS — M25571 Pain in right ankle and joints of right foot: Secondary | ICD-10-CM

## 2021-08-30 MED ORDER — MELOXICAM 15 MG PO TABS: 15.0000 mg | ORAL_TABLET | Freq: Every day | ORAL | 0 refills | Status: DC

## 2021-08-30 NOTE — Progress Notes (Signed)
? ?   Beverly Shaw D.Merril Abbe ?[facility] ?[place] ?Phone: [phone] ?  ?Assessment and Plan:   ?  ?1. Acute right ankle pain ?-Acute, unclear etiology, initial sports medicine visit ?- Mild generalized ankle pain x1 week without significant MOI and without significant finding on imaging.  Patient may have experienced a mild injury while walking or gardening, or may have acute flare of mild arthritic changes in ankle ?- Start meloxicam 15 mg daily x2 weeks.  If still having pain after 2 weeks, complete 3rd-week of meloxicam. May use remaining meloxicam as needed once daily for pain control.  Do not to use additional NSAIDs while taking meloxicam.  May use Tylenol [phone] mg 2 to 3 times a day for breakthrough pain. ?- Start HEP for ankle ?- X-ray obtained in clinic.  My interpretation: No acute fracture or dislocation.  Small, isolated bony segment distal to medial malleolus ?- DG Ankle Complete Right; Future  ?  ?Pertinent previous records reviewed include CMP 11/27/2020 ?  ?Follow Up: 3 weeks for reevaluation.  Could consider ultrasound versus physical therapy versus CSI if no improvement or worsening of symptoms ?  ?Subjective:   ?I, Beverly Shaw, am serving as a Education administrator for [employer] ? ?Chief Complaint: right ankle pain and swelling ? ?HPI:  ? ?08/30/21 ?Patient is a 71 year old female complaining of right ankle pain and swelling. 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 ?Relevant Historical Information: GERD, history of CKD, though kidney function tests unremarkable on last check at 11/27/2020 ? ?Additional pertinent review of systems negative. ? ? ?Current Outpatient Medications:  ?  calcium carbonate (OS-CAL) 600 MG TABS, Take  600 mg by mouth daily.  , Disp: , Rfl:  ?  DULoxetine (CYMBALTA) 30 MG capsule, Take 1 capsule (30 mg total) by mouth daily., Disp: 90 capsule, Rfl: 3 ?  EPINEPHrine 0.3 mg/0.3 mL IJ SOAJ injection, Inject 0.3 mLs (0.3 mg total) into the muscle as needed for anaphylaxis., Disp: 2 each, Rfl: 1 ?  meloxicam (MOBIC) 15 MG tablet, Take 1 tablet (15 mg total) by mouth daily., Disp: 30 tablet, Rfl: 0 ?  Multiple Vitamin (MULTIVITAMIN) capsule, Take 1 capsule by mouth daily.  , Disp: , Rfl:   ? ?Objective:   ?  ?Vitals:  ? 08/30/21 1242  ?BP: 140/80  ?Pulse: 64  ?SpO2: 96%  ?Weight: 133 lb (60.3 kg)  ?Height: '5\' 2"'$  (1.575 m)  ?  ?  ?Body mass index is 24.33 kg/m?.  ?  ?Physical Exam:   ? ?Gen: Appears well, nad, nontoxic and pleasant ?Psych: Alert and oriented, appropriate mood and affect ?Neuro: sensation intact, strength is 5/5 with df/pf/inv/ev, muscle tone wnl ?Skin: no susupicious lesions or rashes ? ?Ankle: no deformity, with mild generalized edema to ankle and distal foot, nonpitting ?TTP mildly lateral malleolus, base of fifth, ATFL, CFL, anterior joint line ?NTTP over fibular head, medial mal, achilles, navicular,deltoid, calcaneous  ?ROM DF 30, PF 45, inv/ev intact ?Negative ant drawer, talar tilt, rotation test, squeeze test. ?Neg thompson ?No pain with resisted inversion or eversion  ? ? ?Electronically signed by:  ?Beverly Shaw D.Merril Abbe ?[facility] ?1:09 PM 08/30/21 ?

## 2021-08-30 NOTE — Patient Instructions (Addendum)
Good to see you  - Start meloxicam 15 mg daily x2 weeks.  If still having pain after 2 weeks, complete 3rd-week of meloxicam. May use remaining meloxicam as needed once daily for pain control.  Do not to use additional NSAIDs while taking meloxicam.  May use Tylenol 500-1000 mg 2 to 3 times a day for breakthrough pain. Ankle HEP 3 week follow up  

## 2021-09-09 DIAGNOSIS — M9903 Segmental and somatic dysfunction of lumbar region: Secondary | ICD-10-CM | POA: Diagnosis not present

## 2021-09-09 DIAGNOSIS — M5442 Lumbago with sciatica, left side: Secondary | ICD-10-CM | POA: Diagnosis not present

## 2021-09-09 DIAGNOSIS — M9904 Segmental and somatic dysfunction of sacral region: Secondary | ICD-10-CM | POA: Diagnosis not present

## 2021-09-09 DIAGNOSIS — M5137 Other intervertebral disc degeneration, lumbosacral region: Secondary | ICD-10-CM | POA: Diagnosis not present

## 2021-09-09 DIAGNOSIS — M5136 Other intervertebral disc degeneration, lumbar region: Secondary | ICD-10-CM | POA: Diagnosis not present

## 2021-09-10 ENCOUNTER — Ambulatory Visit (INDEPENDENT_AMBULATORY_CARE_PROVIDER_SITE_OTHER): Payer: Medicare HMO

## 2021-09-10 DIAGNOSIS — M810 Age-related osteoporosis without current pathological fracture: Secondary | ICD-10-CM | POA: Diagnosis not present

## 2021-09-10 MED ORDER — DENOSUMAB 60 MG/ML ~~LOC~~ SOSY: 60.0000 mg | PREFILLED_SYRINGE | Freq: Once | SUBCUTANEOUS | 0 refills | Status: DC

## 2021-09-10 MED ORDER — DENOSUMAB 60 MG/ML ~~LOC~~ SOSY
60.0000 mg | PREFILLED_SYRINGE | Freq: Once | SUBCUTANEOUS | Status: AC
  Administered 2021-09-10: 60 mg via SUBCUTANEOUS

## 2021-09-10 NOTE — Progress Notes (Signed)
Prolia given.   Please co-sign.  

## 2021-09-11 NOTE — Telephone Encounter (Signed)
Last Prolia inj 09/10/21 ?Next Prolia inj due 03/13/22 ?

## 2021-09-21 NOTE — Progress Notes (Signed)
? ?   Beverly Shaw ?[name] ?[name] ?Phone: [phone] ?  ?Assessment and Plan:   ?  ?1. Acute right ankle pain ?-Acute, unclear etiology, subsequent visit ?- Mild improvement in generalized ankle pain that is intermittent and inconsistent in location ?- Discontinue meloxicam as patient could not tolerate it due to GI side effects ?- Start Voltaren gel topically 1-2 times per day ?- Continue HEP ?- Start physical therapy for ankle ?  ?Pertinent previous records reviewed include none ?  ?Follow Up: 4 to 6 weeks for reevaluation.  Could consider ultrasound versus CSI if no improvement or worsening of symptoms ?  ?Subjective:   ?I, Beverly Shaw, am serving as a Education administrator for Beverly Shaw ?  ?Chief Complaint: right ankle pain and swelling ?  ?HPI:  ?  ?08/30/21 ?Patient is a 71 year old female complaining of right ankle pain and swelling. 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 ?09/23/2021 ?Patient states that  the swelling is still there, she has a red spot on her medial ankle, the pain is random and at different spots on her leg . she can notice it as the end of then day , hurts when she walks down stairs if she is on her feet all day or if she walks a lot she is in pain she ices and heats everyday  ?  ?Relevant Historical Information: GERD, history of CKD, though kidney function tests unremarkable on last check at 11/27/2020 ? ?Additional pertinent review of systems negative. ? ? ?Current Outpatient Medications:  ?  calcium carbonate (OS-CAL) 600 MG TABS, Take 600 mg by mouth daily.  , Disp: , Rfl:  ?  DULoxetine (CYMBALTA) 30 MG capsule, Take 1 capsule (30 mg total) by mouth daily., Disp: 90 capsule, Rfl: 3 ?  EPINEPHrine 0.3  mg/0.3 mL IJ SOAJ injection, Inject 0.3 mLs (0.3 mg total) into the muscle as needed for anaphylaxis., Disp: 2 each, Rfl: 1 ?  meloxicam (MOBIC) 15 MG tablet, Take 1 tablet (15 mg total) by mouth daily., Disp: 30 tablet, Rfl: 0 ?  Multiple Vitamin (MULTIVITAMIN) capsule, Take 1 capsule by mouth daily.  , Disp: , Rfl:   ? ?Objective:   ?  ?Vitals:  ? 09/23/21 0917  ?BP: 122/72  ?Pulse: 74  ?SpO2: 94%  ?Weight: 133 lb (60.3 kg)  ?Height: '5\' 2"'$  (1.575 m)  ?  ?  ?Body mass index is 24.33 kg/m?.  ?  ?Physical Exam:   ? ?Gen: Appears well, nad, nontoxic and pleasant ?Psych: Alert and oriented, appropriate mood and affect ?Neuro: sensation intact, strength is 5/5 with df/pf/inv/ev, muscle tone wnl ?Skin: no susupicious lesions or rashes ?  ?Right ankle: no deformity, with mild generalized edema to ankle and distal foot, nonpitting ?TTP mildly lateral malleolus,  anterior joint line ?NTTP over fibular head, medial mal, achilles, navicular,deltoid, calcaneous , base of fifth, ATFL, CFL, ?ROM DF 30, PF 45, inv/ev intact ?Negative ant drawer, talar tilt, rotation test, squeeze test. ?Neg thompson ?No pain with resisted inversion or eversion  ? ? ?Electronically signed by:  ?Beverly Shaw ?Beverly Shaw ?9:37 AM 09/23/21 ?

## 2021-09-23 ENCOUNTER — Ambulatory Visit: Payer: Medicare HMO | Admitting: Sports Medicine

## 2021-09-23 VITALS — BP 122/72 | HR 74 | Ht 62.0 in | Wt 133.0 lb

## 2021-09-23 DIAGNOSIS — M25571 Pain in right ankle and joints of right foot: Secondary | ICD-10-CM | POA: Diagnosis not present

## 2021-09-23 NOTE — Patient Instructions (Addendum)
Good to see you  ?Pt referral  ?Stop meloxicam  ?Recommend using Voltaren gel 1-2 times a day over painful areas ?Continue HEP  ?4-6 week follow up  ?

## 2021-09-24 DIAGNOSIS — M5136 Other intervertebral disc degeneration, lumbar region: Secondary | ICD-10-CM | POA: Diagnosis not present

## 2021-09-24 DIAGNOSIS — M5442 Lumbago with sciatica, left side: Secondary | ICD-10-CM | POA: Diagnosis not present

## 2021-09-24 DIAGNOSIS — M9903 Segmental and somatic dysfunction of lumbar region: Secondary | ICD-10-CM | POA: Diagnosis not present

## 2021-09-24 DIAGNOSIS — M9904 Segmental and somatic dysfunction of sacral region: Secondary | ICD-10-CM | POA: Diagnosis not present

## 2021-09-24 DIAGNOSIS — M5137 Other intervertebral disc degeneration, lumbosacral region: Secondary | ICD-10-CM | POA: Diagnosis not present

## 2021-09-29 DIAGNOSIS — M25571 Pain in right ankle and joints of right foot: Secondary | ICD-10-CM | POA: Diagnosis not present

## 2021-09-29 DIAGNOSIS — M25371 Other instability, right ankle: Secondary | ICD-10-CM | POA: Diagnosis not present

## 2021-10-07 DIAGNOSIS — M25371 Other instability, right ankle: Secondary | ICD-10-CM | POA: Diagnosis not present

## 2021-10-07 DIAGNOSIS — M25571 Pain in right ankle and joints of right foot: Secondary | ICD-10-CM | POA: Diagnosis not present

## 2021-10-08 DIAGNOSIS — M9903 Segmental and somatic dysfunction of lumbar region: Secondary | ICD-10-CM | POA: Diagnosis not present

## 2021-10-08 DIAGNOSIS — M5136 Other intervertebral disc degeneration, lumbar region: Secondary | ICD-10-CM | POA: Diagnosis not present

## 2021-10-08 DIAGNOSIS — M9904 Segmental and somatic dysfunction of sacral region: Secondary | ICD-10-CM | POA: Diagnosis not present

## 2021-10-08 DIAGNOSIS — M5137 Other intervertebral disc degeneration, lumbosacral region: Secondary | ICD-10-CM | POA: Diagnosis not present

## 2021-10-08 DIAGNOSIS — M5442 Lumbago with sciatica, left side: Secondary | ICD-10-CM | POA: Diagnosis not present

## 2021-10-14 DIAGNOSIS — M25371 Other instability, right ankle: Secondary | ICD-10-CM | POA: Diagnosis not present

## 2021-10-14 DIAGNOSIS — M25571 Pain in right ankle and joints of right foot: Secondary | ICD-10-CM | POA: Diagnosis not present

## 2021-10-20 NOTE — Progress Notes (Unsigned)
    Benito Mccreedy D.[facility] [facility] Phone: [phone]   Assessment and Plan:     There are no diagnoses linked to this encounter.  ***   Pertinent previous records reviewed include ***   Follow Up: ***     Subjective:   I, Shaka Cardin, am serving as a Education administrator for Doctor Glennon Mac   Chief Complaint: right ankle pain and swelling   HPI:    08/30/21 Patient is a 71 year old female complaining of right ankle pain and swelling. 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 she can notice it as the end of then day , hurts when she walks down stairs if she is on her feet all day or if she walks a lot she is in pain she ices and heats everyday   10/21/2021 Patient states    Relevant Historical Information: GERD, history of CKD, though kidney function tests unremarkable on last check at 11/27/2020  Additional pertinent review of systems negative.   Current Outpatient Medications:    calcium carbonate (OS-CAL) 600 MG TABS, Take 600 mg by mouth daily.  , Disp: , Rfl:    DULoxetine (CYMBALTA) 30 MG capsule, Take 1 capsule (30 mg total) by mouth daily., Disp: 90 capsule, Rfl: 3   EPINEPHrine 0.3 mg/0.3 mL IJ SOAJ injection, Inject 0.3 mLs (0.3 mg total) into the muscle as needed for anaphylaxis., Disp: 2 each, Rfl: 1   meloxicam (MOBIC) 15 MG tablet, Take 1 tablet (15 mg total) by mouth daily., Disp: 30 tablet, Rfl: 0   Multiple Vitamin (MULTIVITAMIN) capsule, Take 1 capsule by mouth daily.  , Disp: , Rfl:    Objective:     There were no vitals filed  for this visit.    There is no height or weight on file to calculate BMI.    Physical Exam:    ***   Electronically signed by:  Benito Mccreedy D.[facility] 11:32 AM 10/20/21

## 2021-10-21 ENCOUNTER — Ambulatory Visit: Payer: Medicare HMO | Admitting: Sports Medicine

## 2021-10-21 VITALS — BP 138/80 | HR 73 | Ht 62.0 in | Wt 129.0 lb

## 2021-10-21 DIAGNOSIS — M25571 Pain in right ankle and joints of right foot: Secondary | ICD-10-CM | POA: Diagnosis not present

## 2021-10-21 DIAGNOSIS — M79674 Pain in right toe(s): Secondary | ICD-10-CM

## 2021-10-21 NOTE — Patient Instructions (Signed)
Good to see you   

## 2021-10-22 DIAGNOSIS — M5136 Other intervertebral disc degeneration, lumbar region: Secondary | ICD-10-CM | POA: Diagnosis not present

## 2021-10-22 DIAGNOSIS — M5442 Lumbago with sciatica, left side: Secondary | ICD-10-CM | POA: Diagnosis not present

## 2021-10-22 DIAGNOSIS — M9903 Segmental and somatic dysfunction of lumbar region: Secondary | ICD-10-CM | POA: Diagnosis not present

## 2021-10-22 DIAGNOSIS — M9904 Segmental and somatic dysfunction of sacral region: Secondary | ICD-10-CM | POA: Diagnosis not present

## 2021-10-22 DIAGNOSIS — M5137 Other intervertebral disc degeneration, lumbosacral region: Secondary | ICD-10-CM | POA: Diagnosis not present

## 2021-10-26 DIAGNOSIS — M25571 Pain in right ankle and joints of right foot: Secondary | ICD-10-CM | POA: Diagnosis not present

## 2021-10-26 DIAGNOSIS — M25371 Other instability, right ankle: Secondary | ICD-10-CM | POA: Diagnosis not present

## 2021-11-05 DIAGNOSIS — M5442 Lumbago with sciatica, left side: Secondary | ICD-10-CM | POA: Diagnosis not present

## 2021-11-05 DIAGNOSIS — M5136 Other intervertebral disc degeneration, lumbar region: Secondary | ICD-10-CM | POA: Diagnosis not present

## 2021-11-05 DIAGNOSIS — M5137 Other intervertebral disc degeneration, lumbosacral region: Secondary | ICD-10-CM | POA: Diagnosis not present

## 2021-11-05 DIAGNOSIS — M9904 Segmental and somatic dysfunction of sacral region: Secondary | ICD-10-CM | POA: Diagnosis not present

## 2021-11-05 DIAGNOSIS — M9903 Segmental and somatic dysfunction of lumbar region: Secondary | ICD-10-CM | POA: Diagnosis not present

## 2021-11-19 DIAGNOSIS — M9904 Segmental and somatic dysfunction of sacral region: Secondary | ICD-10-CM | POA: Diagnosis not present

## 2021-11-19 DIAGNOSIS — M5136 Other intervertebral disc degeneration, lumbar region: Secondary | ICD-10-CM | POA: Diagnosis not present

## 2021-11-19 DIAGNOSIS — M9903 Segmental and somatic dysfunction of lumbar region: Secondary | ICD-10-CM | POA: Diagnosis not present

## 2021-11-19 DIAGNOSIS — M5442 Lumbago with sciatica, left side: Secondary | ICD-10-CM | POA: Diagnosis not present

## 2021-11-19 DIAGNOSIS — M5137 Other intervertebral disc degeneration, lumbosacral region: Secondary | ICD-10-CM | POA: Diagnosis not present

## 2021-12-02 DIAGNOSIS — M5136 Other intervertebral disc degeneration, lumbar region: Secondary | ICD-10-CM | POA: Diagnosis not present

## 2021-12-02 DIAGNOSIS — M9903 Segmental and somatic dysfunction of lumbar region: Secondary | ICD-10-CM | POA: Diagnosis not present

## 2021-12-02 DIAGNOSIS — M9904 Segmental and somatic dysfunction of sacral region: Secondary | ICD-10-CM | POA: Diagnosis not present

## 2021-12-02 DIAGNOSIS — M5137 Other intervertebral disc degeneration, lumbosacral region: Secondary | ICD-10-CM | POA: Diagnosis not present

## 2021-12-02 DIAGNOSIS — M5442 Lumbago with sciatica, left side: Secondary | ICD-10-CM | POA: Diagnosis not present

## 2021-12-03 ENCOUNTER — Other Ambulatory Visit (INDEPENDENT_AMBULATORY_CARE_PROVIDER_SITE_OTHER): Payer: Medicare HMO

## 2021-12-03 DIAGNOSIS — E559 Vitamin D deficiency, unspecified: Secondary | ICD-10-CM | POA: Diagnosis not present

## 2021-12-03 DIAGNOSIS — R739 Hyperglycemia, unspecified: Secondary | ICD-10-CM

## 2021-12-03 DIAGNOSIS — E782 Mixed hyperlipidemia: Secondary | ICD-10-CM

## 2021-12-03 DIAGNOSIS — E538 Deficiency of other specified B group vitamins: Secondary | ICD-10-CM

## 2021-12-03 LAB — URINALYSIS, ROUTINE W REFLEX MICROSCOPIC
Bilirubin Urine: NEGATIVE
Hgb urine dipstick: NEGATIVE
Ketones, ur: NEGATIVE
Leukocytes,Ua: NEGATIVE
Nitrite: NEGATIVE
RBC / HPF: NONE SEEN (ref 0–?)
Specific Gravity, Urine: 1.01 (ref 1.000–1.030)
Total Protein, Urine: NEGATIVE
Urine Glucose: NEGATIVE
Urobilinogen, UA: 0.2 (ref 0.0–1.0)
pH: 7 (ref 5.0–8.0)

## 2021-12-03 LAB — CBC WITH DIFFERENTIAL/PLATELET
Basophils Absolute: 0 10*3/uL (ref 0.0–0.1)
Basophils Relative: 0.4 % (ref 0.0–3.0)
Eosinophils Absolute: 0.1 10*3/uL (ref 0.0–0.7)
Eosinophils Relative: 1.2 % (ref 0.0–5.0)
HCT: 39.3 % (ref 36.0–46.0)
Hemoglobin: 13.3 g/dL (ref 12.0–15.0)
Lymphocytes Relative: 40.9 % (ref 12.0–46.0)
Lymphs Abs: 2.2 10*3/uL (ref 0.7–4.0)
MCHC: 33.8 g/dL (ref 30.0–36.0)
MCV: 88.1 fl (ref 78.0–100.0)
Monocytes Absolute: 0.5 10*3/uL (ref 0.1–1.0)
Monocytes Relative: 9.1 % (ref 3.0–12.0)
Neutro Abs: 2.6 10*3/uL (ref 1.4–7.7)
Neutrophils Relative %: 48.4 % (ref 43.0–77.0)
Platelets: 179 10*3/uL (ref 150.0–400.0)
RBC: 4.47 Mil/uL (ref 3.87–5.11)
RDW: 13.3 % (ref 11.5–15.5)
WBC: 5.4 10*3/uL (ref 4.0–10.5)

## 2021-12-03 LAB — HEMOGLOBIN A1C: Hgb A1c MFr Bld: 5.7 % (ref 4.6–6.5)

## 2021-12-03 LAB — LIPID PANEL
Cholesterol: 209 mg/dL — ABNORMAL HIGH (ref 0–200)
HDL: 49.6 mg/dL (ref >39.00)
LDL Cholesterol: 128 mg/dL — ABNORMAL HIGH (ref 0–99)
NonHDL: 159.09
Total CHOL/HDL Ratio: 4
Triglycerides: 153 mg/dL — ABNORMAL HIGH (ref 0.0–149.0)
VLDL: 30.6 mg/dL (ref 0.0–40.0)

## 2021-12-03 LAB — VITAMIN B12: Vitamin B-12: 612 pg/mL (ref 211–911)

## 2021-12-03 LAB — TSH: TSH: 1.36 u[IU]/mL (ref 0.35–5.50)

## 2021-12-03 LAB — BASIC METABOLIC PANEL
BUN: 19 mg/dL (ref 6–23)
CO2: 28 mEq/L (ref 19–32)
Calcium: 8.8 mg/dL (ref 8.4–10.5)
Chloride: 105 mEq/L (ref 96–112)
Creatinine, Ser: 0.75 mg/dL (ref 0.40–1.20)
GFR: 81.21 mL/min (ref >60.00)
Glucose, Bld: 97 mg/dL (ref 70–99)
Potassium: 3.9 mEq/L (ref 3.5–5.1)
Sodium: 140 mEq/L (ref 135–145)

## 2021-12-03 LAB — HEPATIC FUNCTION PANEL
ALT: 21 U/L (ref 0–35)
AST: 25 U/L (ref 0–37)
Albumin: 4.2 g/dL (ref 3.5–5.2)
Alkaline Phosphatase: 42 U/L (ref 39–117)
Bilirubin, Direct: 0.1 mg/dL (ref 0.0–0.3)
Total Bilirubin: 0.6 mg/dL (ref 0.2–1.2)
Total Protein: 6.4 g/dL (ref 6.0–8.3)

## 2021-12-03 LAB — VITAMIN D 25 HYDROXY (VIT D DEFICIENCY, FRACTURES): VITD: 55.88 ng/mL (ref 30.00–100.00)

## 2021-12-10 ENCOUNTER — Encounter: Payer: Self-pay | Admitting: Internal Medicine

## 2021-12-10 ENCOUNTER — Ambulatory Visit (INDEPENDENT_AMBULATORY_CARE_PROVIDER_SITE_OTHER): Payer: Medicare HMO | Admitting: Internal Medicine

## 2021-12-10 VITALS — BP 136/70 | HR 63 | Temp 98.1°F | Ht 62.0 in | Wt 131.0 lb

## 2021-12-10 DIAGNOSIS — E538 Deficiency of other specified B group vitamins: Secondary | ICD-10-CM

## 2021-12-10 DIAGNOSIS — G8929 Other chronic pain: Secondary | ICD-10-CM | POA: Diagnosis not present

## 2021-12-10 DIAGNOSIS — E559 Vitamin D deficiency, unspecified: Secondary | ICD-10-CM | POA: Diagnosis not present

## 2021-12-10 DIAGNOSIS — E782 Mixed hyperlipidemia: Secondary | ICD-10-CM | POA: Diagnosis not present

## 2021-12-10 DIAGNOSIS — R739 Hyperglycemia, unspecified: Secondary | ICD-10-CM

## 2021-12-10 DIAGNOSIS — Z0001 Encounter for general adult medical examination with abnormal findings: Secondary | ICD-10-CM

## 2021-12-10 DIAGNOSIS — M545 Low back pain, unspecified: Secondary | ICD-10-CM | POA: Diagnosis not present

## 2021-12-10 DIAGNOSIS — L578 Other skin changes due to chronic exposure to nonionizing radiation: Secondary | ICD-10-CM | POA: Insufficient documentation

## 2021-12-10 DIAGNOSIS — H9193 Unspecified hearing loss, bilateral: Secondary | ICD-10-CM

## 2021-12-10 MED ORDER — ROSUVASTATIN CALCIUM 10 MG PO TABS: 10.0000 mg | ORAL_TABLET | Freq: Every day | ORAL | 3 refills | Status: DC

## 2021-12-10 NOTE — Patient Instructions (Addendum)
Your ears were cleared today  Please take all new medication as prescribed - the crestor 10 mg per day  Please continue all other medications as before, and refills have been done if requested.  Please have the pharmacy call with any other refills you may need.  Please continue your efforts at being more active, low cholesterol diet, and weight control.  You are otherwise up to date with prevention measures today.  Please keep your appointments with your specialists as you may have planned  Please make an Appointment to return for your 1 year visit, or sooner if needed, with Lab testing by Appointment as well, to be done about 3-5 days before at the [facility] (so this is for TWO appointments - please see the scheduling desk as you leave)

## 2021-12-10 NOTE — Progress Notes (Unsigned)
Patient ID: Beverly Shaw, female   DOB: 1951-02-02, 71 y.o.   MRN: [id number]         Chief Complaint:: wellness exam and hld, bilateral hearing reduced, hyperglycemia, chronic lbp       HPI:  Beverly Shaw is a 71 y.o. female here for wellness exam; declines covid booster, o/w up to date                        Also April 2023 PT for right ankle soft tissue pain - resolved.  Still working full time at [employer].  Taking OTC fish oil and TG improved this year.   Back pain resolved with recent chiropracter  Walks 30 min per day.  Has pain to left [facility] getting worse with DJD, s/p right surgury in past, better with volt gel for now.  Has bilateral tinnitus chronic, but also bilateral hearing loss in the past wk and hx of recurring ear wax impactions.  Trying to follow lower chol diet.  Pt denies chest pain, increased sob or doe, wheezing, orthopnea, PND, increased LE swelling, palpitations, dizziness or syncope.   Pt denies polydipsia, polyuria, or new focal neuro s/s.     Wt Readings from Last 3 Encounters:  12/10/21 131 lb (59.4 kg)  10/21/21 129 lb (58.5 kg)  09/23/21 133 lb (60.3 kg)   BP Readings from Last 3 Encounters:  12/10/21 136/70  10/21/21 138/80  09/23/21 122/72   Immunization History  Administered Date(s) Administered   DTaP 09/20/2008   Fluad Quad(high Dose 65+) 02/18/2020   Influenza,inj,Quad PF,6+ Mos 02/04/2014   Influenza-Unspecified 02/04/2014, 03/06/2016   PFIZER Comirnaty(Gray Top)Covid-19 Tri-Sucrose Vaccine 12/17/2020   PFIZER(Purple Top)SARS-COV-2 Vaccination 06/12/2019, 07/03/2019, 02/21/2020   Pneumococcal Conjugate-13 11/21/2017   Pneumococcal Polysaccharide-23 11/27/2018   Tdap 11/27/2018   Zoster Recombinat (Shingrix) 02/09/2018, 02/01/2019   Zoster, Live 10/19/2012  There are no preventive care reminders to display for this patient.    Past Medical History:  Diagnosis Date   Atrophic kidney 11/18/2015   Episodic low back pain    GERD (gastroesophageal reflux  disease)    Heart murmur    Migraines    Osteopenia 10/15/2010   Osteoporosis, unspecified 10/19/2012   Positive TB test    Positive TB skin test   Past Surgical History:  Procedure Laterality Date   ABDOMINAL HYSTERECTOMY  1996   TUBAL LIGATION      reports that she has quit smoking. She has never used smokeless tobacco. She reports current alcohol use. She reports that she does not use drugs. family history includes Diabetes in an other family member; Heart disease in her father and mother. Allergies  Allergen Reactions   Gabapentin Other (See Comments)    sedation   Mobic [Meloxicam] Nausea And Vomiting   Yellow Jacket Venom [Bee Venom]    Current Outpatient Medications on File Prior to Visit  Medication Sig Dispense Refill   calcium carbonate (OS-CAL) 600 MG TABS Take 600 mg by mouth daily.       denosumab (PROLIA) 60 MG/ML SOSY injection      Multiple Vitamin (MULTIVITAMIN) capsule Take 1 capsule by mouth daily.       Omega-3 Fatty Acids (FISH OIL) 1000 MG CAPS Take by mouth.     EPINEPHrine 0.3 mg/0.3 mL IJ SOAJ injection Inject 0.3 mLs (0.3 mg total) into the muscle as needed for anaphylaxis. (Patient not taking: Reported on 12/10/2021) 2 each 1   No current  facility-administered medications on file prior to visit.        ROS:  All others reviewed and negative.  Objective        PE:  BP 136/70 (BP Location: Right Arm, Patient Position: Sitting, Cuff Size: Normal)   Pulse 63   Temp 98.1 F (36.7 C) (Oral)   Ht '5\' 2"'$  (1.575 m)   Wt 131 lb (59.4 kg)   SpO2 98%   BMI 23.96 kg/m                 Constitutional: Pt appears in NAD               HENT: Head: NCAT.                Right Ear: External ear normal.                 Left Ear: External ear normal.                Eyes: . Pupils are equal, round, and reactive to light. Conjunctivae and EOM are normal               Nose: without d/c or deformity               Neck: Neck supple. Gross normal ROM                Cardiovascular: Normal rate and regular rhythm.                 Pulmonary/Chest: Effort normal and breath sounds without rales or wheezing.                Abd:  Soft, NT, ND, + BS, no organomegaly               Neurological: Pt is alert. At baseline orientation, motor grossly intact               Skin: Skin is warm. No rashes, no other new lesions, LE edema - none               Psychiatric: Pt behavior is normal without agitation   Micro: none  Cardiac tracings I have personally interpreted today:  none  Pertinent Radiological findings (summarize): none   Lab Results  Component Value Date   WBC 5.4 12/03/2021   HGB 13.3 12/03/2021   HCT 39.3 12/03/2021   PLT 179.0 12/03/2021   GLUCOSE 97 12/03/2021   CHOL 209 (H) 12/03/2021   TRIG 153.0 (H) 12/03/2021   HDL 49.60 12/03/2021   LDLDIRECT 103.0 11/27/2020   LDLCALC 128 (H) 12/03/2021   ALT 21 12/03/2021   AST 25 12/03/2021   NA 140 12/03/2021   K 3.9 12/03/2021   CL 105 12/03/2021   CREATININE 0.75 12/03/2021   BUN 19 12/03/2021   CO2 28 12/03/2021   TSH 1.36 12/03/2021   HGBA1C 5.7 12/03/2021   Assessment/Plan:  Beverly Shaw is a 71 y.o. White or Caucasian [1] female with  has a past medical history of Atrophic kidney (11/18/2015), Episodic low back pain, GERD (gastroesophageal reflux disease), Heart murmur, Migraines, Osteopenia (10/15/2010), Osteoporosis, unspecified (10/19/2012), and Positive TB test.  Chronic lower back pain Much improved with recent chiropracter  Encounter for well adult exam with abnormal findings Age and sex appropriate education and counseling updated with regular exercise and diet Referrals for preventative services - none needed Immunizations addressed - declines covid booster Smoking counseling  - none needed  Evidence for depression or other mood disorder - none significant Most recent labs reviewed. I have personally reviewed and have noted: 1) the patient's medical and social history 2) The  patient's current medications and supplements 3) The patient's height, weight, and BMI have been recorded in the chart   Hyperlipidemia Lab Results  Component Value Date   LDLCALC 128 (H) 12/03/2021   Uncontrolled, goal ldl < 70, pt to start crestor 10 mg qd   Hyperglycemia Lab Results  Component Value Date   HGBA1C 5.7 12/03/2021   Stable, pt to continue current medical treatment  - diet, wt control, excercise   Bilateral hearing loss Resolved with irrigation and hearing improved  Ceruminosis is noted.  Wax is removed by syringing and manual debridement. Instructions for home care to prevent wax buildup are given.  Followup: Return in about 1 year (around 12/11/2022).  Cathlean Cower, MD 12/12/2021 8:49 PM [facility]

## 2021-12-10 NOTE — Assessment & Plan Note (Signed)
Much improved with recent chiropracter

## 2021-12-12 ENCOUNTER — Encounter: Payer: Self-pay | Admitting: Internal Medicine

## 2021-12-12 NOTE — Assessment & Plan Note (Signed)
Lab Results  Component Value Date   LDLCALC 128 (H) 12/03/2021   Uncontrolled, goal ldl < 70, pt to start crestor 10 mg qd

## 2021-12-12 NOTE — Assessment & Plan Note (Signed)

## 2021-12-12 NOTE — Assessment & Plan Note (Signed)
Lab Results  Component Value Date   HGBA1C 5.7 12/03/2021   Stable, pt to continue current medical treatment  - diet, wt control, excercise

## 2021-12-12 NOTE — Assessment & Plan Note (Signed)
Resolved with irrigation and hearing improved  Ceruminosis is noted.  Wax is removed by syringing and manual debridement. Instructions for home care to prevent wax buildup are given.

## 2021-12-17 DIAGNOSIS — M5136 Other intervertebral disc degeneration, lumbar region: Secondary | ICD-10-CM | POA: Diagnosis not present

## 2021-12-17 DIAGNOSIS — M5442 Lumbago with sciatica, left side: Secondary | ICD-10-CM | POA: Diagnosis not present

## 2021-12-17 DIAGNOSIS — M5137 Other intervertebral disc degeneration, lumbosacral region: Secondary | ICD-10-CM | POA: Diagnosis not present

## 2021-12-17 DIAGNOSIS — M9904 Segmental and somatic dysfunction of sacral region: Secondary | ICD-10-CM | POA: Diagnosis not present

## 2021-12-17 DIAGNOSIS — M9903 Segmental and somatic dysfunction of lumbar region: Secondary | ICD-10-CM | POA: Diagnosis not present

## 2021-12-31 DIAGNOSIS — M5136 Other intervertebral disc degeneration, lumbar region: Secondary | ICD-10-CM | POA: Diagnosis not present

## 2021-12-31 DIAGNOSIS — M9904 Segmental and somatic dysfunction of sacral region: Secondary | ICD-10-CM | POA: Diagnosis not present

## 2021-12-31 DIAGNOSIS — M5442 Lumbago with sciatica, left side: Secondary | ICD-10-CM | POA: Diagnosis not present

## 2021-12-31 DIAGNOSIS — M9903 Segmental and somatic dysfunction of lumbar region: Secondary | ICD-10-CM | POA: Diagnosis not present

## 2021-12-31 DIAGNOSIS — M5137 Other intervertebral disc degeneration, lumbosacral region: Secondary | ICD-10-CM | POA: Diagnosis not present

## 2022-01-04 ENCOUNTER — Other Ambulatory Visit: Payer: Self-pay | Admitting: Internal Medicine

## 2022-01-04 DIAGNOSIS — Z1231 Encounter for screening mammogram for malignant neoplasm of breast: Secondary | ICD-10-CM

## 2022-01-14 DIAGNOSIS — M9903 Segmental and somatic dysfunction of lumbar region: Secondary | ICD-10-CM | POA: Diagnosis not present

## 2022-01-14 DIAGNOSIS — M5137 Other intervertebral disc degeneration, lumbosacral region: Secondary | ICD-10-CM | POA: Diagnosis not present

## 2022-01-14 DIAGNOSIS — M5136 Other intervertebral disc degeneration, lumbar region: Secondary | ICD-10-CM | POA: Diagnosis not present

## 2022-01-14 DIAGNOSIS — M5442 Lumbago with sciatica, left side: Secondary | ICD-10-CM | POA: Diagnosis not present

## 2022-01-14 DIAGNOSIS — M9904 Segmental and somatic dysfunction of sacral region: Secondary | ICD-10-CM | POA: Diagnosis not present

## 2022-01-29 NOTE — Telephone Encounter (Signed)
Prolia VOB initiated via [email or web address] 

## 2022-02-03 DIAGNOSIS — M5136 Other intervertebral disc degeneration, lumbar region: Secondary | ICD-10-CM | POA: Diagnosis not present

## 2022-02-03 DIAGNOSIS — M5442 Lumbago with sciatica, left side: Secondary | ICD-10-CM | POA: Diagnosis not present

## 2022-02-03 DIAGNOSIS — M9903 Segmental and somatic dysfunction of lumbar region: Secondary | ICD-10-CM | POA: Diagnosis not present

## 2022-02-03 DIAGNOSIS — M9904 Segmental and somatic dysfunction of sacral region: Secondary | ICD-10-CM | POA: Diagnosis not present

## 2022-02-03 DIAGNOSIS — M5137 Other intervertebral disc degeneration, lumbosacral region: Secondary | ICD-10-CM | POA: Diagnosis not present

## 2022-02-04 DIAGNOSIS — L814 Other melanin hyperpigmentation: Secondary | ICD-10-CM | POA: Diagnosis not present

## 2022-02-04 DIAGNOSIS — L821 Other seborrheic keratosis: Secondary | ICD-10-CM | POA: Diagnosis not present

## 2022-02-04 DIAGNOSIS — L578 Other skin changes due to chronic exposure to nonionizing radiation: Secondary | ICD-10-CM | POA: Diagnosis not present

## 2022-02-04 DIAGNOSIS — D225 Melanocytic nevi of trunk: Secondary | ICD-10-CM | POA: Diagnosis not present

## 2022-02-04 DIAGNOSIS — D2271 Melanocytic nevi of right lower limb, including hip: Secondary | ICD-10-CM | POA: Diagnosis not present

## 2022-02-09 NOTE — Telephone Encounter (Signed)
Prior auth required for PROLIA (renewal due)  PA PROCESS DETAILS: Precertification is required. Call [phone] or complete the Precertification form available at [email or web address]

## 2022-02-11 ENCOUNTER — Ambulatory Visit
Admission: RE | Admit: 2022-02-11 | Discharge: 2022-02-11 | Disposition: A | Discharge status: Home / Self Care | Payer: Medicare HMO | Source: Ambulatory Visit | Attending: Internal Medicine | Admitting: Internal Medicine

## 2022-02-11 DIAGNOSIS — Z1231 Encounter for screening mammogram for malignant neoplasm of breast: Secondary | ICD-10-CM | POA: Diagnosis not present

## 2022-02-17 DIAGNOSIS — M1812 Unilateral primary osteoarthritis of first carpometacarpal joint, left hand: Secondary | ICD-10-CM | POA: Diagnosis not present

## 2022-02-17 DIAGNOSIS — G5603 Carpal tunnel syndrome, bilateral upper limbs: Secondary | ICD-10-CM | POA: Diagnosis not present

## 2022-02-17 DIAGNOSIS — M79642 Pain in left hand: Secondary | ICD-10-CM | POA: Diagnosis not present

## 2022-02-18 DIAGNOSIS — M5442 Lumbago with sciatica, left side: Secondary | ICD-10-CM | POA: Diagnosis not present

## 2022-02-18 DIAGNOSIS — M5137 Other intervertebral disc degeneration, lumbosacral region: Secondary | ICD-10-CM | POA: Diagnosis not present

## 2022-02-18 DIAGNOSIS — M9904 Segmental and somatic dysfunction of sacral region: Secondary | ICD-10-CM | POA: Diagnosis not present

## 2022-02-18 DIAGNOSIS — M5136 Other intervertebral disc degeneration, lumbar region: Secondary | ICD-10-CM | POA: Diagnosis not present

## 2022-02-18 DIAGNOSIS — M9903 Segmental and somatic dysfunction of lumbar region: Secondary | ICD-10-CM | POA: Diagnosis not present

## 2022-03-02 DIAGNOSIS — G5602 Carpal tunnel syndrome, left upper limb: Secondary | ICD-10-CM | POA: Diagnosis not present

## 2022-03-11 DIAGNOSIS — M5137 Other intervertebral disc degeneration, lumbosacral region: Secondary | ICD-10-CM | POA: Diagnosis not present

## 2022-03-11 DIAGNOSIS — M5442 Lumbago with sciatica, left side: Secondary | ICD-10-CM | POA: Diagnosis not present

## 2022-03-11 DIAGNOSIS — M5136 Other intervertebral disc degeneration, lumbar region: Secondary | ICD-10-CM | POA: Diagnosis not present

## 2022-03-11 DIAGNOSIS — M9904 Segmental and somatic dysfunction of sacral region: Secondary | ICD-10-CM | POA: Diagnosis not present

## 2022-03-11 DIAGNOSIS — M9903 Segmental and somatic dysfunction of lumbar region: Secondary | ICD-10-CM | POA: Diagnosis not present

## 2022-03-17 DIAGNOSIS — M1812 Unilateral primary osteoarthritis of first carpometacarpal joint, left hand: Secondary | ICD-10-CM | POA: Diagnosis not present

## 2022-03-17 DIAGNOSIS — M79642 Pain in left hand: Secondary | ICD-10-CM | POA: Diagnosis not present

## 2022-03-17 DIAGNOSIS — G5603 Carpal tunnel syndrome, bilateral upper limbs: Secondary | ICD-10-CM | POA: Diagnosis not present

## 2022-03-18 ENCOUNTER — Ambulatory Visit: Payer: Medicare HMO

## 2022-03-22 ENCOUNTER — Other Ambulatory Visit (HOSPITAL_COMMUNITY): Payer: Self-pay

## 2022-03-22 NOTE — Telephone Encounter (Signed)
Next Prolia inj was due 03/13/22

## 2022-03-22 NOTE — Telephone Encounter (Signed)
Pharmacy Patient Advocate Encounter  Insurance verification completed.    The patient is insured through [employer]   Ran test claims for: Prolia 60mg.  Pharmacy benefit copay: $100.00  

## 2022-03-25 DIAGNOSIS — M5136 Other intervertebral disc degeneration, lumbar region: Secondary | ICD-10-CM | POA: Diagnosis not present

## 2022-03-25 DIAGNOSIS — M5137 Other intervertebral disc degeneration, lumbosacral region: Secondary | ICD-10-CM | POA: Diagnosis not present

## 2022-03-25 DIAGNOSIS — M5442 Lumbago with sciatica, left side: Secondary | ICD-10-CM | POA: Diagnosis not present

## 2022-03-25 DIAGNOSIS — M9903 Segmental and somatic dysfunction of lumbar region: Secondary | ICD-10-CM | POA: Diagnosis not present

## 2022-03-25 DIAGNOSIS — M9904 Segmental and somatic dysfunction of sacral region: Secondary | ICD-10-CM | POA: Diagnosis not present

## 2022-03-30 ENCOUNTER — Telehealth: Payer: Self-pay | Admitting: Internal Medicine

## 2022-03-30 NOTE — Telephone Encounter (Signed)
Patient would like to know if her prolia injection was ever approved and when she can schedule her appointment .

## 2022-04-01 NOTE — Telephone Encounter (Signed)
Forwarding to Rx Prior Auth Team 

## 2022-04-05 NOTE — Telephone Encounter (Signed)
Left message for a call back regarding prolia.

## 2022-04-08 ENCOUNTER — Ambulatory Visit: Payer: Medicare HMO | Admitting: Family Medicine

## 2022-04-08 DIAGNOSIS — M5442 Lumbago with sciatica, left side: Secondary | ICD-10-CM | POA: Diagnosis not present

## 2022-04-08 DIAGNOSIS — M5137 Other intervertebral disc degeneration, lumbosacral region: Secondary | ICD-10-CM | POA: Diagnosis not present

## 2022-04-08 DIAGNOSIS — M9903 Segmental and somatic dysfunction of lumbar region: Secondary | ICD-10-CM | POA: Diagnosis not present

## 2022-04-08 DIAGNOSIS — M9904 Segmental and somatic dysfunction of sacral region: Secondary | ICD-10-CM | POA: Diagnosis not present

## 2022-04-08 DIAGNOSIS — M5136 Other intervertebral disc degeneration, lumbar region: Secondary | ICD-10-CM | POA: Diagnosis not present

## 2022-04-19 NOTE — Telephone Encounter (Signed)
Call PROLIA in to [facility] to have Prolia shipped to office.  Please see auth team message pasted below:  Prolia $100 through pharmacy benefit, plus $25 admin fee is cheaper option for patient.   Please send prescription to [facility] to have med shipped to the office.

## 2022-04-19 NOTE — Telephone Encounter (Signed)
Prolia $100 through pharmacy benefit, plus $25 admin fee is cheaper option for patient.  Please send prescription to [facility] to have med shipped to the office.

## 2022-04-20 ENCOUNTER — Other Ambulatory Visit: Payer: Self-pay

## 2022-05-12 DIAGNOSIS — H52223 Regular astigmatism, bilateral: Secondary | ICD-10-CM | POA: Diagnosis not present

## 2022-05-12 DIAGNOSIS — Z0100 Encounter for examination of eyes and vision without abnormal findings: Secondary | ICD-10-CM | POA: Diagnosis not present

## 2022-05-12 DIAGNOSIS — H5203 Hypermetropia, bilateral: Secondary | ICD-10-CM | POA: Diagnosis not present

## 2022-05-12 DIAGNOSIS — H25813 Combined forms of age-related cataract, bilateral: Secondary | ICD-10-CM | POA: Diagnosis not present

## 2022-05-12 DIAGNOSIS — H524 Presbyopia: Secondary | ICD-10-CM | POA: Diagnosis not present

## 2022-05-20 DIAGNOSIS — M5442 Lumbago with sciatica, left side: Secondary | ICD-10-CM | POA: Diagnosis not present

## 2022-05-20 DIAGNOSIS — M9903 Segmental and somatic dysfunction of lumbar region: Secondary | ICD-10-CM | POA: Diagnosis not present

## 2022-05-20 DIAGNOSIS — M9904 Segmental and somatic dysfunction of sacral region: Secondary | ICD-10-CM | POA: Diagnosis not present

## 2022-05-20 DIAGNOSIS — M5136 Other intervertebral disc degeneration, lumbar region: Secondary | ICD-10-CM | POA: Diagnosis not present

## 2022-05-20 DIAGNOSIS — M5137 Other intervertebral disc degeneration, lumbosacral region: Secondary | ICD-10-CM | POA: Diagnosis not present

## 2022-06-01 DIAGNOSIS — M1812 Unilateral primary osteoarthritis of first carpometacarpal joint, left hand: Secondary | ICD-10-CM | POA: Diagnosis not present

## 2022-06-03 DIAGNOSIS — M5136 Other intervertebral disc degeneration, lumbar region: Secondary | ICD-10-CM | POA: Diagnosis not present

## 2022-06-03 DIAGNOSIS — M9903 Segmental and somatic dysfunction of lumbar region: Secondary | ICD-10-CM | POA: Diagnosis not present

## 2022-06-03 DIAGNOSIS — M5137 Other intervertebral disc degeneration, lumbosacral region: Secondary | ICD-10-CM | POA: Diagnosis not present

## 2022-06-03 DIAGNOSIS — M9904 Segmental and somatic dysfunction of sacral region: Secondary | ICD-10-CM | POA: Diagnosis not present

## 2022-06-03 DIAGNOSIS — M5442 Lumbago with sciatica, left side: Secondary | ICD-10-CM | POA: Diagnosis not present

## 2022-06-17 DIAGNOSIS — M1812 Unilateral primary osteoarthritis of first carpometacarpal joint, left hand: Secondary | ICD-10-CM | POA: Diagnosis not present

## 2022-06-17 DIAGNOSIS — M19032 Primary osteoarthritis, left wrist: Secondary | ICD-10-CM | POA: Diagnosis not present

## 2022-06-30 DIAGNOSIS — M25642 Stiffness of left hand, not elsewhere classified: Secondary | ICD-10-CM | POA: Diagnosis not present

## 2022-06-30 DIAGNOSIS — M1812 Unilateral primary osteoarthritis of first carpometacarpal joint, left hand: Secondary | ICD-10-CM | POA: Diagnosis not present

## 2022-07-01 ENCOUNTER — Ambulatory Visit (INDEPENDENT_AMBULATORY_CARE_PROVIDER_SITE_OTHER): Payer: Medicare HMO | Admitting: Internal Medicine

## 2022-07-01 ENCOUNTER — Other Ambulatory Visit: Payer: Self-pay | Admitting: Internal Medicine

## 2022-07-01 VITALS — BP 120/72 | HR 70 | Temp 97.9°F | Ht 62.0 in | Wt 131.0 lb

## 2022-07-01 DIAGNOSIS — R739 Hyperglycemia, unspecified: Secondary | ICD-10-CM

## 2022-07-01 DIAGNOSIS — E782 Mixed hyperlipidemia: Secondary | ICD-10-CM

## 2022-07-01 DIAGNOSIS — M810 Age-related osteoporosis without current pathological fracture: Secondary | ICD-10-CM | POA: Diagnosis not present

## 2022-07-01 DIAGNOSIS — M9903 Segmental and somatic dysfunction of lumbar region: Secondary | ICD-10-CM | POA: Diagnosis not present

## 2022-07-01 DIAGNOSIS — R002 Palpitations: Secondary | ICD-10-CM | POA: Diagnosis not present

## 2022-07-01 DIAGNOSIS — M5137 Other intervertebral disc degeneration, lumbosacral region: Secondary | ICD-10-CM | POA: Diagnosis not present

## 2022-07-01 DIAGNOSIS — M5136 Other intervertebral disc degeneration, lumbar region: Secondary | ICD-10-CM | POA: Diagnosis not present

## 2022-07-01 DIAGNOSIS — Z0001 Encounter for general adult medical examination with abnormal findings: Secondary | ICD-10-CM | POA: Diagnosis not present

## 2022-07-01 DIAGNOSIS — R232 Flushing: Secondary | ICD-10-CM

## 2022-07-01 DIAGNOSIS — M9904 Segmental and somatic dysfunction of sacral region: Secondary | ICD-10-CM | POA: Diagnosis not present

## 2022-07-01 DIAGNOSIS — M5442 Lumbago with sciatica, left side: Secondary | ICD-10-CM | POA: Diagnosis not present

## 2022-07-01 MED ORDER — VEOZAH 45 MG PO TABS: 45.0000 mg | ORAL_TABLET | Freq: Every day | ORAL | 0 refills | Status: AC

## 2022-07-01 MED ORDER — ROSUVASTATIN CALCIUM 5 MG PO TABS: 5.0000 mg | ORAL_TABLET | Freq: Every day | ORAL | 3 refills | Status: DC

## 2022-07-01 MED ORDER — VEOZAH 45 MG PO TABS: 45.0000 mg | ORAL_TABLET | Freq: Every day | ORAL | 3 refills | Status: DC

## 2022-07-01 NOTE — Progress Notes (Unsigned)
Patient ID: Beverly Shaw, female   DOB: 10/02/1950, 72 y.o.   MRN: [id number]         Chief Complaint:: wellness exam and Osteoperosis and Medication Refill (Decrease crestor to 25m)  , hld, hot flash, palpitations       HPI:  FKYLEE ERNZENis a 72y.o. female here for wellness exam; declines covid booster, flu shot o/w up to date                        Also for some reason has missed her oct 2023 prolia shot, and needs to restart, apparently some logistical issue I suspect.  Pt is convinced herself she can tolerate only 5 mg crestor due to myalgias, so hoping for new rx so as to not have to cut pills.  Pt denies chest pain, increased sob or doe, wheezing, orthopnea, PND, increased LE swelling, dizziness or syncope but does have occasional palpittations without associated symptoms short lived.  Also has 2-3 times per day hot flashes with sweating and hot then cold sensations, hoping for treatment.  .   Wt Readings from Last 3 Encounters:  07/01/22 131 lb (59.4 kg)  12/10/21 131 lb (59.4 kg)  10/21/21 129 lb (58.5 kg)   BP Readings from Last 3 Encounters:  07/01/22 120/72  12/10/21 136/70  10/21/21 138/80   Immunization History  Administered Date(s) Administered   COVID-19, mRNA, vaccine(Comirnaty)12 years and older 03/20/2022   DTaP 09/20/2008   Fluad Quad(high Dose 65+) 02/18/2020   Influenza,inj,Quad PF,6+ Mos 02/04/2014   Influenza-Unspecified 02/04/2014, 03/06/2016   PFIZER Comirnaty(Gray Top)Covid-19 Tri-Sucrose Vaccine 12/17/2020   PFIZER(Purple Top)SARS-COV-2 Vaccination 06/12/2019, 07/03/2019, 02/21/2020   Pneumococcal Conjugate-13 11/21/2017   Pneumococcal Polysaccharide-23 11/27/2018   Tdap 11/27/2018   Zoster Recombinat (Shingrix) 02/09/2018, 03/23/2018, 02/01/2019   Zoster, Live 10/19/2012   Health Maintenance Due  Topic Date Due   Medicare Annual Wellness (AWV)  Never done      Past Medical History:  Diagnosis Date   Atrophic kidney 11/18/2015   Episodic low  back pain    GERD (gastroesophageal reflux disease)    Heart murmur    Migraines    Osteopenia 10/15/2010   Osteoporosis, unspecified 10/19/2012   Positive TB test    Positive TB skin test   Past Surgical History:  Procedure Laterality Date   ABDOMINAL HYSTERECTOMY  1996   TUBAL LIGATION      reports that she has quit smoking. She has never used smokeless tobacco. She reports current alcohol use. She reports that she does not use drugs. family history includes Diabetes in an other family member; Heart disease in her father and mother. Allergies  Allergen Reactions   Other Other (See Comments)   Brimonidine Tartrate Other (See Comments)   Gabapentin Other (See Comments)    sedation   Mobic [Meloxicam] Nausea And Vomiting   Yellow Jacket Venom [Bee Venom]    Current Outpatient Medications on File Prior to Visit  Medication Sig Dispense Refill   calcium carbonate (OS-CAL) 600 MG TABS Take 600 mg by mouth daily.       denosumab (PROLIA) 60 MG/ML SOSY injection      EPINEPHrine 0.3 mg/0.3 mL IJ SOAJ injection Inject 0.3 mLs (0.3 mg total) into the muscle as needed for anaphylaxis. 2 each 1   HYDROcodone-acetaminophen (NORCO/VICODIN) 5-325 MG tablet Take 1 tablet by mouth every 6 (six) hours.     Multiple Vitamin (MULTIVITAMIN) capsule Take 1 capsule  by mouth daily.       Omega-3 Fatty Acids (FISH OIL) 1000 MG CAPS Take by mouth.     traMADol (ULTRAM) 50 MG tablet Take 50 mg by mouth every 6 (six) hours.     No current facility-administered medications on file prior to visit.        ROS:  All others reviewed and negative.  Objective        PE:  BP 120/72 (BP Location: Right Arm, Patient Position: Sitting, Cuff Size: Large)   Pulse 70   Temp 97.9 F (36.6 C) (Oral)   Ht 5' 2"$  (1.575 m)   Wt 131 lb (59.4 kg)   SpO2 99%   BMI 23.96 kg/m                 Constitutional: Pt appears in NAD               HENT: Head: NCAT.                Right Ear: External ear normal.                  Left Ear: External ear normal.                Eyes: . Pupils are equal, round, and reactive to light. Conjunctivae and EOM are normal               Nose: without d/c or deformity               Neck: Neck supple. Gross normal ROM               Cardiovascular: Normal rate and regular rhythm.                 Pulmonary/Chest: Effort normal and breath sounds without rales or wheezing.                Abd:  Soft, NT, ND, + BS, no organomegaly               Neurological: Pt is alert. At baseline orientation, motor grossly intact               Skin: Skin is warm. No rashes, no other new lesions, LE edema - none               Psychiatric: Pt behavior is normal without agitation   Micro: none  Cardiac tracings I have personally interpreted today:  ECG - NSR 64  Pertinent Radiological findings (summarize): none   Lab Results  Component Value Date   WBC 5.4 12/03/2021   HGB 13.3 12/03/2021   HCT 39.3 12/03/2021   PLT 179.0 12/03/2021   GLUCOSE 97 12/03/2021   CHOL 209 (H) 12/03/2021   TRIG 153.0 (H) 12/03/2021   HDL 49.60 12/03/2021   LDLDIRECT 103.0 11/27/2020   LDLCALC 128 (H) 12/03/2021   ALT 21 12/03/2021   AST 25 12/03/2021   NA 140 12/03/2021   K 3.9 12/03/2021   CL 105 12/03/2021   CREATININE 0.75 12/03/2021   BUN 19 12/03/2021   CO2 28 12/03/2021   TSH 1.36 12/03/2021   HGBA1C 5.7 12/03/2021   Assessment/Plan:  Beverly Shaw is a 72 y.o. White or Caucasian [1] female with  has a past medical history of Atrophic kidney (11/18/2015), Episodic low back pain, GERD (gastroesophageal reflux disease), Heart murmur, Migraines, Osteopenia (10/15/2010), Osteoporosis, unspecified (10/19/2012), and Positive TB test.  Encounter for well  adult exam with abnormal findings Age and sex appropriate education and counseling updated with regular exercise and diet Referrals for preventative services - none needed Immunizations addressed - declines covid booster and flu shot Smoking counseling   - none needed Evidence for depression or other mood disorder - none significant Most recent labs reviewed. I have personally reviewed and have noted: 1) the patient's medical and social history 2) The patient's current medications and supplements 3) The patient's height, weight, and BMI have been recorded in the chart   Hyperglycemia Lab Results  Component Value Date   HGBA1C 5.7 12/03/2021   Stable, pt to continue current medical treatment  - diet wt control   Hyperlipidemia Lab Results  Component Value Date   LDLCALC 128 (H) 12/03/2021   Uncontrolled, but pt willing to take 5 mg crestor qd only, ok for new rx, pt declines other such as zetia    Hot flashes Chronic persistent > 10 yrs - for veozah 75 mg daily  Palpitations Exam benign, ecg reviewed, declines heart monitor for now,  to f/u any worsening symptoms or concerns  Osteoporosis Will need to arrange to restart prolia  Followup: Return in about 6 months (around 12/30/2022).  Cathlean Cower, MD 07/03/2022 1:17 PM [facility]

## 2022-07-01 NOTE — Patient Instructions (Signed)
We have sent message to Beverly Shaw regarding restarting the Prolia asap  Your EKG was normal today  Ok to decrease the crestor to 5 mg as you mentioned  Please take all new medication as prescribed - the [facility] for hot flashes  Please continue all other medications as before, and refills have been done if requested.  Please have the pharmacy call with any other refills you may need.  Please keep your appointments with your specialists as you may have planned  Please make an Appointment to return in 6 months, or sooner if needed, also with Lab Appointment for testing done 3-5 days before at the [facility] (so this is for TWO appointments - please see the scheduling desk as you leave)

## 2022-07-03 ENCOUNTER — Encounter: Payer: Self-pay | Admitting: Internal Medicine

## 2022-07-03 DIAGNOSIS — R002 Palpitations: Secondary | ICD-10-CM | POA: Insufficient documentation

## 2022-07-03 NOTE — Assessment & Plan Note (Signed)
Lab Results  Component Value Date   HGBA1C 5.7 12/03/2021   Stable, pt to continue current medical treatment  - diet wt control

## 2022-07-03 NOTE — Assessment & Plan Note (Signed)
Will need to arrange to restart prolia

## 2022-07-03 NOTE — Assessment & Plan Note (Signed)
Chronic persistent > 10 yrs - for veozah 75 mg daily

## 2022-07-03 NOTE — Assessment & Plan Note (Signed)
Exam benign, ecg reviewed, declines heart monitor for now,  to f/u any worsening symptoms or concerns

## 2022-07-03 NOTE — Assessment & Plan Note (Signed)
Lab Results  Component Value Date   LDLCALC 128 (H) 12/03/2021   Uncontrolled, but pt willing to take 5 mg crestor qd only, ok for new rx, pt declines other such as zetia

## 2022-07-03 NOTE — Assessment & Plan Note (Signed)
Age and sex appropriate education and counseling updated with regular exercise and diet Referrals for preventative services - none needed Immunizations addressed - declines covid booster and flu shot Smoking counseling  - none needed Evidence for depression or other mood disorder - none significant Most recent labs reviewed. I have personally reviewed and have noted: 1) the patient's medical and social history 2) The patient's current medications and supplements 3) The patient's height, weight, and BMI have been recorded in the chart

## 2022-07-03 NOTE — Telephone Encounter (Signed)
Biagio Borg, MD  Jasper Loser, CMA; Max Sane, CMA Hello - pt missed oct 2023 Prolia injection  Can we get restarted?   Thanks Dr Jenny Reichmann

## 2022-07-04 ENCOUNTER — Telehealth: Payer: Self-pay

## 2022-07-04 ENCOUNTER — Ambulatory Visit: Payer: Medicare HMO

## 2022-07-04 ENCOUNTER — Other Ambulatory Visit (HOSPITAL_COMMUNITY): Payer: Self-pay

## 2022-07-04 NOTE — Telephone Encounter (Signed)
Pharmacy Patient Advocate Encounter  Insurance verification completed.    The patient is insured through [employer] test claims for: Prolia 95m.  Pharmacy benefit copay: $100.00

## 2022-07-04 NOTE — Telephone Encounter (Signed)
Pt need  PA for her prolia injection

## 2022-07-04 NOTE — Telephone Encounter (Signed)
Prolia VOB initiated via [email or web address] 

## 2022-07-05 NOTE — Telephone Encounter (Signed)
Prior Authorization initiated for [facility] via [employer] Case ID: [id number] - pending tech review

## 2022-07-06 NOTE — Telephone Encounter (Signed)
APPROVED PA# [id number] Valid: 07/05/22-07/06/23

## 2022-07-06 NOTE — Telephone Encounter (Signed)
Pt ready for scheduling on or after 05/23/22  Out-of-pocket cost due at time of visit: $327  Primary: [employer] PPO Prolia co-insurance: 20% (approximately $302) Admin fee co-insurance: 20% (approximately $25)  Deductible: does not apply  Prior Auth: APPROVED PA# [id number] Valid: 07/05/22-07/06/23  Secondary: n/a Prolia co-insurance:  Admin fee co-insurance:   Deductible: n/a  Prior Auth: n/a PA# Valid:   ** This summary of benefits is an estimation of the patient's out-of-pocket cost. Exact cost may vary based on individual plan coverage.

## 2022-07-06 NOTE — Telephone Encounter (Signed)
Auth# [id number] pending

## 2022-07-07 NOTE — Telephone Encounter (Addendum)
Patient Advocate Encounter  Prior Authorization for Prolia 14m has been approved.    PA# [id number] dates: 07/05/22 through 07/06/23

## 2022-07-07 NOTE — Telephone Encounter (Signed)
Pt ready for scheduling for Prolia on or after : 07/07/22  Out-of-pocket cost due at time of visit: $327  Primary:  Prolia co-insurance: 20% Admin fee co-insurance: 20%  Secondary: N/A Prolia co-insurance:  Admin fee co-insurance:   Medical Benefit Details: Date Benefits were checked: 07/04/22 Deductible: N/A/ Coinsurance: 20%/ Admin Fee: 20%  Prior Auth: Approved PA# [id number]  Expiration Date: 07/06/23   Pharmacy benefit: Copay $100 If patient wants fill through the pharmacy benefit please send prescription to: [employer], and include estimated need by date in rx notes. Pharmacy will ship medication directly to the office.  Patient not eligible for Prolia Copay Card. Copay Card can make patient's cost as little as $25. Link to apply: [email or web address]  ** This summary of benefits is an estimation of the patient's out-of-pocket cost. Exact cost may very based on individual plan coverage.

## 2022-07-08 DIAGNOSIS — M25642 Stiffness of left hand, not elsewhere classified: Secondary | ICD-10-CM | POA: Diagnosis not present

## 2022-07-15 ENCOUNTER — Ambulatory Visit: Payer: Medicare HMO

## 2022-07-15 DIAGNOSIS — M810 Age-related osteoporosis without current pathological fracture: Secondary | ICD-10-CM

## 2022-07-15 DIAGNOSIS — M5442 Lumbago with sciatica, left side: Secondary | ICD-10-CM | POA: Diagnosis not present

## 2022-07-15 DIAGNOSIS — M5137 Other intervertebral disc degeneration, lumbosacral region: Secondary | ICD-10-CM | POA: Diagnosis not present

## 2022-07-15 DIAGNOSIS — M25642 Stiffness of left hand, not elsewhere classified: Secondary | ICD-10-CM | POA: Diagnosis not present

## 2022-07-15 DIAGNOSIS — M9904 Segmental and somatic dysfunction of sacral region: Secondary | ICD-10-CM | POA: Diagnosis not present

## 2022-07-15 DIAGNOSIS — M9903 Segmental and somatic dysfunction of lumbar region: Secondary | ICD-10-CM | POA: Diagnosis not present

## 2022-07-15 DIAGNOSIS — M5136 Other intervertebral disc degeneration, lumbar region: Secondary | ICD-10-CM | POA: Diagnosis not present

## 2022-07-15 MED ORDER — DENOSUMAB 60 MG/ML ~~LOC~~ SOSY
60.0000 mg | PREFILLED_SYRINGE | Freq: Once | SUBCUTANEOUS | Status: AC
  Administered 2022-07-15: 60 mg via SUBCUTANEOUS

## 2022-07-15 MED ORDER — DENOSUMAB 60 MG/ML ~~LOC~~ SOSY: 60.0000 mg | PREFILLED_SYRINGE | Freq: Once | SUBCUTANEOUS | 0 refills | Status: DC

## 2022-07-19 NOTE — Telephone Encounter (Signed)
Forwarding to Rx Prior Auth Team 

## 2022-07-20 NOTE — Progress Notes (Signed)
Prolia given-pt tolerated well.

## 2022-07-22 DIAGNOSIS — M25642 Stiffness of left hand, not elsewhere classified: Secondary | ICD-10-CM | POA: Diagnosis not present

## 2022-07-29 DIAGNOSIS — M5442 Lumbago with sciatica, left side: Secondary | ICD-10-CM | POA: Diagnosis not present

## 2022-07-29 DIAGNOSIS — M5137 Other intervertebral disc degeneration, lumbosacral region: Secondary | ICD-10-CM | POA: Diagnosis not present

## 2022-07-29 DIAGNOSIS — M9904 Segmental and somatic dysfunction of sacral region: Secondary | ICD-10-CM | POA: Diagnosis not present

## 2022-07-29 DIAGNOSIS — M25642 Stiffness of left hand, not elsewhere classified: Secondary | ICD-10-CM | POA: Diagnosis not present

## 2022-07-29 DIAGNOSIS — M5136 Other intervertebral disc degeneration, lumbar region: Secondary | ICD-10-CM | POA: Diagnosis not present

## 2022-07-29 DIAGNOSIS — M9903 Segmental and somatic dysfunction of lumbar region: Secondary | ICD-10-CM | POA: Diagnosis not present

## 2022-08-12 DIAGNOSIS — M5136 Other intervertebral disc degeneration, lumbar region: Secondary | ICD-10-CM | POA: Diagnosis not present

## 2022-08-12 DIAGNOSIS — M5442 Lumbago with sciatica, left side: Secondary | ICD-10-CM | POA: Diagnosis not present

## 2022-08-12 DIAGNOSIS — M9904 Segmental and somatic dysfunction of sacral region: Secondary | ICD-10-CM | POA: Diagnosis not present

## 2022-08-12 DIAGNOSIS — M5137 Other intervertebral disc degeneration, lumbosacral region: Secondary | ICD-10-CM | POA: Diagnosis not present

## 2022-08-12 DIAGNOSIS — M25642 Stiffness of left hand, not elsewhere classified: Secondary | ICD-10-CM | POA: Diagnosis not present

## 2022-08-12 DIAGNOSIS — M9903 Segmental and somatic dysfunction of lumbar region: Secondary | ICD-10-CM | POA: Diagnosis not present

## 2022-08-26 DIAGNOSIS — M5442 Lumbago with sciatica, left side: Secondary | ICD-10-CM | POA: Diagnosis not present

## 2022-08-26 DIAGNOSIS — M9904 Segmental and somatic dysfunction of sacral region: Secondary | ICD-10-CM | POA: Diagnosis not present

## 2022-08-26 DIAGNOSIS — M5137 Other intervertebral disc degeneration, lumbosacral region: Secondary | ICD-10-CM | POA: Diagnosis not present

## 2022-08-26 DIAGNOSIS — M5136 Other intervertebral disc degeneration, lumbar region: Secondary | ICD-10-CM | POA: Diagnosis not present

## 2022-08-26 DIAGNOSIS — M9903 Segmental and somatic dysfunction of lumbar region: Secondary | ICD-10-CM | POA: Diagnosis not present

## 2022-09-09 DIAGNOSIS — M5136 Other intervertebral disc degeneration, lumbar region: Secondary | ICD-10-CM | POA: Diagnosis not present

## 2022-09-09 DIAGNOSIS — M9904 Segmental and somatic dysfunction of sacral region: Secondary | ICD-10-CM | POA: Diagnosis not present

## 2022-09-09 DIAGNOSIS — M9903 Segmental and somatic dysfunction of lumbar region: Secondary | ICD-10-CM | POA: Diagnosis not present

## 2022-09-09 DIAGNOSIS — M5442 Lumbago with sciatica, left side: Secondary | ICD-10-CM | POA: Diagnosis not present

## 2022-09-09 DIAGNOSIS — M5137 Other intervertebral disc degeneration, lumbosacral region: Secondary | ICD-10-CM | POA: Diagnosis not present

## 2022-09-21 DIAGNOSIS — M25511 Pain in right shoulder: Secondary | ICD-10-CM | POA: Diagnosis not present

## 2022-09-23 DIAGNOSIS — M9904 Segmental and somatic dysfunction of sacral region: Secondary | ICD-10-CM | POA: Diagnosis not present

## 2022-09-23 DIAGNOSIS — M5442 Lumbago with sciatica, left side: Secondary | ICD-10-CM | POA: Diagnosis not present

## 2022-09-23 DIAGNOSIS — M5136 Other intervertebral disc degeneration, lumbar region: Secondary | ICD-10-CM | POA: Diagnosis not present

## 2022-09-23 DIAGNOSIS — M9903 Segmental and somatic dysfunction of lumbar region: Secondary | ICD-10-CM | POA: Diagnosis not present

## 2022-09-23 DIAGNOSIS — M5137 Other intervertebral disc degeneration, lumbosacral region: Secondary | ICD-10-CM | POA: Diagnosis not present

## 2022-10-07 DIAGNOSIS — M5136 Other intervertebral disc degeneration, lumbar region: Secondary | ICD-10-CM | POA: Diagnosis not present

## 2022-10-07 DIAGNOSIS — M5442 Lumbago with sciatica, left side: Secondary | ICD-10-CM | POA: Diagnosis not present

## 2022-10-07 DIAGNOSIS — M9903 Segmental and somatic dysfunction of lumbar region: Secondary | ICD-10-CM | POA: Diagnosis not present

## 2022-10-07 DIAGNOSIS — M5137 Other intervertebral disc degeneration, lumbosacral region: Secondary | ICD-10-CM | POA: Diagnosis not present

## 2022-10-07 DIAGNOSIS — M9904 Segmental and somatic dysfunction of sacral region: Secondary | ICD-10-CM | POA: Diagnosis not present

## 2022-10-21 DIAGNOSIS — M9904 Segmental and somatic dysfunction of sacral region: Secondary | ICD-10-CM | POA: Diagnosis not present

## 2022-10-21 DIAGNOSIS — M5137 Other intervertebral disc degeneration, lumbosacral region: Secondary | ICD-10-CM | POA: Diagnosis not present

## 2022-10-21 DIAGNOSIS — M9903 Segmental and somatic dysfunction of lumbar region: Secondary | ICD-10-CM | POA: Diagnosis not present

## 2022-10-21 DIAGNOSIS — M5136 Other intervertebral disc degeneration, lumbar region: Secondary | ICD-10-CM | POA: Diagnosis not present

## 2022-10-21 DIAGNOSIS — M5442 Lumbago with sciatica, left side: Secondary | ICD-10-CM | POA: Diagnosis not present

## 2022-11-04 DIAGNOSIS — M9903 Segmental and somatic dysfunction of lumbar region: Secondary | ICD-10-CM | POA: Diagnosis not present

## 2022-11-04 DIAGNOSIS — M5137 Other intervertebral disc degeneration, lumbosacral region: Secondary | ICD-10-CM | POA: Diagnosis not present

## 2022-11-04 DIAGNOSIS — M5136 Other intervertebral disc degeneration, lumbar region: Secondary | ICD-10-CM | POA: Diagnosis not present

## 2022-11-04 DIAGNOSIS — M5442 Lumbago with sciatica, left side: Secondary | ICD-10-CM | POA: Diagnosis not present

## 2022-11-04 DIAGNOSIS — M9904 Segmental and somatic dysfunction of sacral region: Secondary | ICD-10-CM | POA: Diagnosis not present

## 2022-12-02 DIAGNOSIS — M9904 Segmental and somatic dysfunction of sacral region: Secondary | ICD-10-CM | POA: Diagnosis not present

## 2022-12-02 DIAGNOSIS — M5136 Other intervertebral disc degeneration, lumbar region: Secondary | ICD-10-CM | POA: Diagnosis not present

## 2022-12-02 DIAGNOSIS — M9903 Segmental and somatic dysfunction of lumbar region: Secondary | ICD-10-CM | POA: Diagnosis not present

## 2022-12-02 DIAGNOSIS — M5442 Lumbago with sciatica, left side: Secondary | ICD-10-CM | POA: Diagnosis not present

## 2022-12-02 DIAGNOSIS — M5137 Other intervertebral disc degeneration, lumbosacral region: Secondary | ICD-10-CM | POA: Diagnosis not present

## 2022-12-09 ENCOUNTER — Other Ambulatory Visit (INDEPENDENT_AMBULATORY_CARE_PROVIDER_SITE_OTHER): Payer: Medicare HMO

## 2022-12-09 DIAGNOSIS — E559 Vitamin D deficiency, unspecified: Secondary | ICD-10-CM

## 2022-12-09 DIAGNOSIS — E538 Deficiency of other specified B group vitamins: Secondary | ICD-10-CM

## 2022-12-09 DIAGNOSIS — E782 Mixed hyperlipidemia: Secondary | ICD-10-CM | POA: Diagnosis not present

## 2022-12-09 DIAGNOSIS — R739 Hyperglycemia, unspecified: Secondary | ICD-10-CM | POA: Diagnosis not present

## 2022-12-09 LAB — TSH: TSH: 1.74 u[IU]/mL (ref 0.35–5.50)

## 2022-12-09 LAB — CBC WITH DIFFERENTIAL/PLATELET
Basophils Absolute: 0 10*3/uL (ref 0.0–0.1)
Basophils Relative: 0.4 % (ref 0.0–3.0)
Eosinophils Absolute: 0.1 10*3/uL (ref 0.0–0.7)
Eosinophils Relative: 1.2 % (ref 0.0–5.0)
HCT: 40.3 % (ref 36.0–46.0)
Hemoglobin: 13.2 g/dL (ref 12.0–15.0)
Lymphocytes Relative: 38 % (ref 12.0–46.0)
Lymphs Abs: 2 10*3/uL (ref 0.7–4.0)
MCHC: 32.7 g/dL (ref 30.0–36.0)
MCV: 88.6 fl (ref 78.0–100.0)
Monocytes Absolute: 0.5 10*3/uL (ref 0.1–1.0)
Monocytes Relative: 8.8 % (ref 3.0–12.0)
Neutro Abs: 2.7 10*3/uL (ref 1.4–7.7)
Neutrophils Relative %: 51.6 % (ref 43.0–77.0)
Platelets: 178 10*3/uL (ref 150.0–400.0)
RBC: 4.55 Mil/uL (ref 3.87–5.11)
RDW: 13.4 % (ref 11.5–15.5)
WBC: 5.2 10*3/uL (ref 4.0–10.5)

## 2022-12-09 LAB — URINALYSIS, ROUTINE W REFLEX MICROSCOPIC
Bilirubin Urine: NEGATIVE
Ketones, ur: NEGATIVE
Leukocytes,Ua: NEGATIVE
Nitrite: NEGATIVE
Specific Gravity, Urine: >=1.03 — AB (ref 1.000–1.030)
Total Protein, Urine: NEGATIVE
Urine Glucose: NEGATIVE
Urobilinogen, UA: 0.2 (ref 0.0–1.0)
WBC, UA: NONE SEEN (ref 0–?)
pH: 5.5 (ref 5.0–8.0)

## 2022-12-09 LAB — LIPID PANEL
Cholesterol: 158 mg/dL (ref 0–200)
HDL: 53 mg/dL (ref >39.00)
LDL Cholesterol: 79 mg/dL (ref 0–99)
NonHDL: 105.23
Total CHOL/HDL Ratio: 3
Triglycerides: 132 mg/dL (ref 0.0–149.0)
VLDL: 26.4 mg/dL (ref 0.0–40.0)

## 2022-12-09 LAB — VITAMIN D 25 HYDROXY (VIT D DEFICIENCY, FRACTURES): VITD: 46.8 ng/mL (ref 30.00–100.00)

## 2022-12-09 LAB — BASIC METABOLIC PANEL
BUN: 19 mg/dL (ref 6–23)
CO2: 29 mEq/L (ref 19–32)
Calcium: 9.4 mg/dL (ref 8.4–10.5)
Chloride: 106 mEq/L (ref 96–112)
Creatinine, Ser: 0.77 mg/dL (ref 0.40–1.20)
GFR: 78.12 mL/min (ref >60.00)
Glucose, Bld: 98 mg/dL (ref 70–99)
Potassium: 4.5 mEq/L (ref 3.5–5.1)
Sodium: 143 mEq/L (ref 135–145)

## 2022-12-09 LAB — HEPATIC FUNCTION PANEL
ALT: 18 U/L (ref 0–35)
AST: 21 U/L (ref 0–37)
Albumin: 4.3 g/dL (ref 3.5–5.2)
Alkaline Phosphatase: 39 U/L (ref 39–117)
Bilirubin, Direct: 0.1 mg/dL (ref 0.0–0.3)
Total Bilirubin: 0.5 mg/dL (ref 0.2–1.2)
Total Protein: 6.2 g/dL (ref 6.0–8.3)

## 2022-12-09 LAB — VITAMIN B12: Vitamin B-12: 499 pg/mL (ref 211–911)

## 2022-12-09 LAB — HEMOGLOBIN A1C: Hgb A1c MFr Bld: 5.7 % (ref 4.6–6.5)

## 2022-12-16 ENCOUNTER — Ambulatory Visit: Payer: Medicare HMO | Admitting: Internal Medicine

## 2022-12-16 ENCOUNTER — Encounter: Payer: Self-pay | Admitting: Internal Medicine

## 2022-12-16 ENCOUNTER — Telehealth: Payer: Self-pay

## 2022-12-16 ENCOUNTER — Other Ambulatory Visit (HOSPITAL_COMMUNITY): Payer: Self-pay

## 2022-12-16 VITALS — BP 136/88 | HR 65 | Temp 98.1°F | Ht 62.0 in | Wt 125.0 lb

## 2022-12-16 DIAGNOSIS — Z0001 Encounter for general adult medical examination with abnormal findings: Secondary | ICD-10-CM | POA: Diagnosis not present

## 2022-12-16 DIAGNOSIS — R739 Hyperglycemia, unspecified: Secondary | ICD-10-CM | POA: Diagnosis not present

## 2022-12-16 DIAGNOSIS — M810 Age-related osteoporosis without current pathological fracture: Secondary | ICD-10-CM | POA: Diagnosis not present

## 2022-12-16 DIAGNOSIS — E782 Mixed hyperlipidemia: Secondary | ICD-10-CM | POA: Diagnosis not present

## 2022-12-16 DIAGNOSIS — K219 Gastro-esophageal reflux disease without esophagitis: Secondary | ICD-10-CM | POA: Diagnosis not present

## 2022-12-16 MED ORDER — ROSUVASTATIN CALCIUM 5 MG PO TABS: 5.0000 mg | ORAL_TABLET | Freq: Every day | ORAL | 3 refills | Status: DC

## 2022-12-16 MED ORDER — PANTOPRAZOLE SODIUM 40 MG PO TBEC: 40.0000 mg | DELAYED_RELEASE_TABLET | Freq: Every day | ORAL | 3 refills | Status: DC

## 2022-12-16 MED ORDER — VEOZAH 45 MG PO TABS: 45.0000 mg | ORAL_TABLET | Freq: Every day | ORAL | 3 refills | Status: DC

## 2022-12-16 MED ORDER — EPINEPHRINE 0.3 MG/0.3ML IJ SOAJ: 0.3000 mg | INTRAMUSCULAR | 1 refills | Status: AC | PRN

## 2022-12-16 NOTE — Telephone Encounter (Signed)
Created new encounter for Prolia BIV. Will route encounter back once benefit verification is complete.  

## 2022-12-16 NOTE — Patient Instructions (Signed)
Please take all new medication as prescribed- the protonix 40 mg per day  Please continue all other medications as before, and refills have been done if requested.  Please have the pharmacy call with any other refills you may need.  Please continue your efforts at being more active, low cholesterol diet, and weight control.  You are otherwise up to date with prevention measures today.  Please keep your appointments with your specialists as you may have planned  Please make an Appointment to return for your 1 year visit, or sooner if needed, with Lab testing by Appointment as well, to be done about 3-5 days before at the FIRST FLOOR Lab (so this is for TWO appointments - please see the scheduling desk as you leave)

## 2022-12-16 NOTE — Telephone Encounter (Signed)
Pt ready for scheduling for PROLIA on or after : 01/12/23  Out-of-pocket cost due at time of visit: $327  Primary: [employer] Prolia co-insurance: 20% Admin fee co-insurance: 20%  Secondary: --- Prolia co-insurance:  Admin fee co-insurance:   Medical Benefit Details: Date Benefits were checked: 12/16/22 Deductible: NO/ Coinsurance: 20%/ Admin Fee: 20%  Prior Auth: APPROVED PA# [id number] Expiration Date: 07/05/22-07/06/23  # of doses approved:  Pharmacy benefit: Copay $100 If patient wants fill through the pharmacy benefit please send prescription to: [facility], and include estimated need by date in rx notes. Pharmacy will ship medication directly to the office.  Patient NOT eligible for Prolia Copay Card. Copay Card can make patient's cost as little as $25. Link to apply: [email or web address]  ** This summary of benefits is an estimation of the patient's out-of-pocket cost. Exact cost may very based on individual plan coverage. ;

## 2022-12-16 NOTE — Progress Notes (Unsigned)
Patient ID: Beverly Shaw, female   DOB: Oct 27, 1950, 72 y.o.   MRN: [id number]         Chief Complaint:: wellness exam and Annual Exam (Been having post nasal drip for awhile, after eating feels discomfort and gassy bloating been going on for several months )  , hld, osteoporosis, hyperglycemia       HPI:  Beverly Shaw is a 72 y.o. female here for wellness exam; dec;ines covid booster, o/w up to date                        Also had a fall may 1 with a trip and fall to right shoulder with pain for 2 mo, neg for fracture, still has good function overall now and no complaiatns.  Has had mild worsening reflux, but no abd pain, dysphagia, n/v, bowel change or blood.  Pt denies chest pain, increased sob or doe, wheezing, orthopnea, PND, increased LE swelling, palpitations, dizziness or syncope.   Pt denies polydipsia, polyuria, or new focal neuro s/s.      Wt Readings from Last 3 Encounters:  12/16/22 125 lb (56.7 kg)  07/01/22 131 lb (59.4 kg)  12/10/21 131 lb (59.4 kg)   BP Readings from Last 3 Encounters:  12/16/22 136/88  07/01/22 120/72  12/10/21 136/70   Immunization History  Administered Date(s) Administered   COVID-19, mRNA, vaccine(Comirnaty)12 years and older 03/20/2022   DTaP 09/20/2008   Fluad Quad(high Dose 65+) 02/18/2020   Influenza Split 02/04/2014   Influenza,inj,Quad PF,6+ Mos 02/04/2014   Influenza,inj,Quad PF,6-35 Mos 03/06/2016   Influenza-Unspecified 02/04/2014, 03/06/2016   PFIZER Comirnaty(Gray Top)Covid-19 Tri-Sucrose Vaccine 12/17/2020   PFIZER(Purple Top)SARS-COV-2 Vaccination 06/12/2019, 07/03/2019, 02/21/2020   Pneumococcal Conjugate-13 11/21/2017   Pneumococcal Polysaccharide-23 11/27/2018   Tdap 11/27/2018   Zoster Recombinant(Shingrix) 02/09/2018, 03/23/2018, 02/01/2019   Zoster, Live 10/19/2012   Health Maintenance Due  Topic Date Due   Medicare Annual Wellness (AWV)  Never done   COVID-19 Vaccine (6 - 2023-24 season) 05/15/2022      Past  Medical History:  Diagnosis Date   Atrophic kidney 11/18/2015   Episodic low back pain    GERD (gastroesophageal reflux disease)    Heart murmur    Migraines    Osteopenia 10/15/2010   Osteoporosis, unspecified 10/19/2012   Positive TB test    Positive TB skin test   Past Surgical History:  Procedure Laterality Date   ABDOMINAL HYSTERECTOMY  1996   TUBAL LIGATION      reports that she has quit smoking. She has never used smokeless tobacco. She reports current alcohol use. She reports that she does not use drugs. family history includes Diabetes in an other family member; Heart disease in her father and mother. Allergies  Allergen Reactions   Other Other (See Comments)   Brimonidine Tartrate Other (See Comments)   Gabapentin Other (See Comments)    sedation   Mobic [Meloxicam] Nausea And Vomiting   Yellow Jacket Venom [Bee Venom]    Current Outpatient Medications on File Prior to Visit  Medication Sig Dispense Refill   calcium carbonate (OS-CAL) 600 MG TABS Take 600 mg by mouth daily.       denosumab (PROLIA) 60 MG/ML SOSY injection      Multiple Vitamin (MULTIVITAMIN) capsule Take 1 capsule by mouth daily.       Omega-3 Fatty Acids (FISH OIL) 1000 MG CAPS Take by mouth.     No current facility-administered medications on  file prior to visit.        ROS:  All others reviewed and negative.  Objective        PE:  BP 136/88 (BP Location: Left Arm, Patient Position: Sitting, Cuff Size: Normal)   Pulse 65   Temp 98.1 F (36.7 C) (Oral)   Ht 5\' 2"  (1.575 m)   Wt 125 lb (56.7 kg)   SpO2 99%   BMI 22.86 kg/m                 Constitutional: Pt appears in NAD               HENT: Head: NCAT.                Right Ear: External ear normal.                 Left Ear: External ear normal.                Eyes: . Pupils are equal, round, and reactive to light. Conjunctivae and EOM are normal               Nose: without d/c or deformity               Neck: Neck supple. Gross normal  ROM               Cardiovascular: Normal rate and regular rhythm.                 Pulmonary/Chest: Effort normal and breath sounds without rales or wheezing.                Abd:  Soft, NT, ND, + BS, no organomegaly               Neurological: Pt is alert. At baseline orientation, motor grossly intact               Skin: Skin is warm. No rashes, no other new lesions, LE edema - none               Psychiatric: Pt behavior is normal without agitation   Micro: none  Cardiac tracings I have personally interpreted today:  none  Pertinent Radiological findings (summarize): none   Lab Results  Component Value Date   WBC 5.2 12/09/2022   HGB 13.2 12/09/2022   HCT 40.3 12/09/2022   PLT 178.0 12/09/2022   GLUCOSE 98 12/09/2022   CHOL 158 12/09/2022   TRIG 132.0 12/09/2022   HDL 53.00 12/09/2022   LDLDIRECT 103.0 11/27/2020   LDLCALC 79 12/09/2022   ALT 18 12/09/2022   AST 21 12/09/2022   NA 143 12/09/2022   K 4.5 12/09/2022   CL 106 12/09/2022   CREATININE 0.77 12/09/2022   BUN 19 12/09/2022   CO2 29 12/09/2022   TSH 1.74 12/09/2022   HGBA1C 5.7 12/09/2022   Assessment/Plan:  Beverly Shaw is a 72 y.o. White or Caucasian [1] female with  has a past medical history of Atrophic kidney (11/18/2015), Episodic low back pain, GERD (gastroesophageal reflux disease), Heart murmur, Migraines, Osteopenia (10/15/2010), Osteoporosis, unspecified (10/19/2012), and Positive TB test.  Encounter for well adult exam with abnormal findings Age and sex appropriate education and counseling updated with regular exercise and diet Referrals for preventative services - none needed Immunizations addressed - declines covid booster Smoking counseling  - none needed Evidence for depression or other mood disorder - none significant Most recent labs reviewed. I have  personally reviewed and have noted: 1) the patient's medical and social history 2) The patient's current medications and supplements 3) The  patient's height, weight, and BMI have been recorded in the chart   GERD (gastroesophageal reflux disease) Mild to mod worsening, for protonix 40 qd,  to f/u any worsening symptoms or concerns   Hyperlipidemia Lab Results  Component Value Date   LDLCALC 79 12/09/2022   Uncontrolled, goal ldl < 70, pt to continue current statin crestor 5 mg as declines increase for now   Hyperglycemia Lab Results  Component Value Date   HGBA1C 5.7 12/09/2022   Stable, pt to continue current medical treatment  - diet, wt control   Osteoporosis Stable, cont proliia asd  Followup: Return in about 1 year (around 12/16/2023).  Oliver Barre, MD 12/18/2022 3:44 PM [facility]

## 2022-12-16 NOTE — Telephone Encounter (Signed)
Prolia VOB initiated via [email or web address]  Last Prolia inj: 07/15/22 Next Prolia inj DUE: 01/12/23

## 2022-12-18 ENCOUNTER — Encounter: Payer: Self-pay | Admitting: Internal Medicine

## 2022-12-18 NOTE — Assessment & Plan Note (Signed)
Mild to mod worsening, for protonix 40 qd,  to f/u any worsening symptoms or concerns

## 2022-12-18 NOTE — Assessment & Plan Note (Signed)
Lab Results  Component Value Date   HGBA1C 5.7 12/09/2022   Stable, pt to continue current medical treatment  - diet, wt control

## 2022-12-18 NOTE — Assessment & Plan Note (Signed)
Lab Results  Component Value Date   LDLCALC 79 12/09/2022   Uncontrolled, goal ldl < 70, pt to continue current statin crestor 5 mg as declines increase for now

## 2022-12-18 NOTE — Assessment & Plan Note (Signed)

## 2022-12-18 NOTE — Assessment & Plan Note (Signed)
Stable, cont proliia asd

## 2022-12-21 ENCOUNTER — Encounter (INDEPENDENT_AMBULATORY_CARE_PROVIDER_SITE_OTHER): Payer: Self-pay

## 2022-12-30 DIAGNOSIS — M9903 Segmental and somatic dysfunction of lumbar region: Secondary | ICD-10-CM | POA: Diagnosis not present

## 2022-12-30 DIAGNOSIS — M5137 Other intervertebral disc degeneration, lumbosacral region: Secondary | ICD-10-CM | POA: Diagnosis not present

## 2022-12-30 DIAGNOSIS — M9904 Segmental and somatic dysfunction of sacral region: Secondary | ICD-10-CM | POA: Diagnosis not present

## 2022-12-30 DIAGNOSIS — M5136 Other intervertebral disc degeneration, lumbar region: Secondary | ICD-10-CM | POA: Diagnosis not present

## 2022-12-30 DIAGNOSIS — M5442 Lumbago with sciatica, left side: Secondary | ICD-10-CM | POA: Diagnosis not present

## 2023-01-05 ENCOUNTER — Ambulatory Visit (INDEPENDENT_AMBULATORY_CARE_PROVIDER_SITE_OTHER): Payer: Medicare HMO

## 2023-01-05 VITALS — Ht 62.5 in | Wt 125.0 lb

## 2023-01-05 DIAGNOSIS — Z1211 Encounter for screening for malignant neoplasm of colon: Secondary | ICD-10-CM | POA: Diagnosis not present

## 2023-01-05 DIAGNOSIS — Z0000 Encounter for general adult medical examination without abnormal findings: Secondary | ICD-10-CM | POA: Diagnosis not present

## 2023-01-05 DIAGNOSIS — Z1212 Encounter for screening for malignant neoplasm of rectum: Secondary | ICD-10-CM

## 2023-01-05 DIAGNOSIS — Z780 Asymptomatic menopausal state: Secondary | ICD-10-CM | POA: Diagnosis not present

## 2023-01-05 NOTE — Progress Notes (Signed)
Subjective:   Beverly Shaw is a 72 y.o. female who presents for an Initial Medicare Annual Wellness Visit.  Visit Complete: Virtual  I connected with  Beverly Shaw on 01/05/23 by a audio enabled telemedicine application and verified that I am speaking with the correct person using two identifiers.  Patient Location: Home  Provider Location: Office/Clinic  I discussed the limitations of evaluation and management by telemedicine. The patient expressed understanding and agreed to proceed.  Vital Signs: Because this visit was a virtual/telehealth visit, some criteria may be missing or patient reported. Any vitals not documented were not able to be obtained and vitals that have been documented are patient reported.    Review of Systems    Cardiac Risk Factors include: advanced age (>58men, >41 women);Other (see comment);dyslipidemia, Risk factor comments: osteoporosis, Atropic kidney     Objective:    Today's Vitals   01/05/23 1309  Weight: 125 lb (56.7 kg)  Height: 5' 2.5" (1.588 m)   Body mass index is 22.5 kg/m.     01/05/2023    1:15 PM 02/15/2019    9:50 AM 06/01/2018    9:21 AM 12/06/2013    3:30 PM  Advanced Directives  Does Patient Have a Medical Advance Directive? Yes No No Patient does not have advance directive  Type of Public librarian Power of [place];Living will     Copy of Healthcare Power of Attorney in Chart? No - copy requested     Would patient like information on creating a medical advance directive?  No - Patient declined No - Patient declined   Pre-existing out of facility DNR order (yellow form or pink MOST form)    No    Current Medications (verified) Outpatient Encounter Medications as of 01/05/2023  Medication Sig   calcium carbonate (OS-CAL) 600 MG TABS Take 600 mg by mouth daily.     denosumab (PROLIA) 60 MG/ML SOSY injection    EPINEPHrine 0.3 mg/0.3 mL IJ SOAJ injection Inject 0.3 mg into the muscle as needed for anaphylaxis.    Fezolinetant (VEOZAH) 45 MG TABS Take 1 tablet (45 mg total) by mouth daily.   Multiple Vitamin (MULTIVITAMIN) capsule Take 1 capsule by mouth daily.     Omega-3 Fatty Acids (FISH OIL) 1000 MG CAPS Take by mouth.   pantoprazole (PROTONIX) 40 MG tablet Take 1 tablet (40 mg total) by mouth daily.   rosuvastatin (CRESTOR) 5 MG tablet Take 1 tablet (5 mg total) by mouth daily.   No facility-administered encounter medications on file as of 01/05/2023.    Allergies (verified) Other, Brimonidine tartrate, Gabapentin, Mobic [meloxicam], and Yellow jacket venom [bee venom]   History: Past Medical History:  Diagnosis Date   Atrophic kidney 11/18/2015   Episodic low back pain    GERD (gastroesophageal reflux disease)    Heart murmur    Migraines    Osteopenia 10/15/2010   Osteoporosis, unspecified 10/19/2012   Positive TB test    Positive TB skin test   Past Surgical History:  Procedure Laterality Date   ABDOMINAL HYSTERECTOMY  1996   TUBAL LIGATION     Family History  Problem Relation Age of Onset   Heart disease Mother    Heart disease Father    Diabetes Other    Colon cancer Neg Hx    Pancreatic cancer Neg Hx    Rectal cancer Neg Hx    Stomach cancer Neg Hx    Osteoporosis Neg Hx    Social History  Socioeconomic History   Marital status: Married    Spouse name: Douglass   Number of children: 3   Years of education: Not on file   Highest education level: Not on file  Occupational History   Occupation: ED Control Coordinator [employer]  Tobacco Use   Smoking status: Former   Smokeless tobacco: Never  Vaping Use   Vaping status: Never Used  Substance and Sexual Activity   Alcohol use: Yes    Comment: once monthly   Drug use: No   Sexual activity: Not on file  Other Topics Concern   Not on file  Social History Narrative   Works part-time at [employer]   Social Determinants of Health   Financial Resource Strain: Low Risk  (01/05/2023)   Overall Financial  Resource Strain (CARDIA)    Difficulty of Paying Living Expenses: Not hard at all  Food Insecurity: No Food Insecurity (01/05/2023)   Hunger Vital Sign    Worried About Running Out of Food in the Last Year: Never true    Ran Out of Food in the Last Year: Never true  Transportation Needs: No Transportation Needs (01/05/2023)   PRAPARE - Administrator, Civil Service (Medical): No    Lack of Transportation (Non-Medical): No  Physical Activity: Sufficiently Active (01/05/2023)   Exercise Vital Sign    Days of Exercise per Week: 5 days    Minutes of Exercise per Session: 60 min  Stress: No Stress Concern Present (01/05/2023)   [employer] of Occupational Health - Occupational Stress Questionnaire    Feeling of Stress : Not at all  Social Connections: Moderately Isolated (01/05/2023)   Social Connection and Isolation Panel [NHANES]    Frequency of Communication with Friends and Family: More than three times a week    Frequency of Social Gatherings with Friends and Family: Twice a week    Attends Religious Services: Never    Diplomatic Services operational officer: Not on file    Attends Banker Meetings: Never    Marital Status: Married    Tobacco Counseling Counseling given: Not Answered   Clinical Intake:  Pre-visit preparation completed: Yes  Pain : No/denies pain     BMI - recorded: 22.5 Nutritional Risks: None Diabetes: No  How often do you need to have someone help you when you read instructions, pamphlets, or other written materials from your doctor or pharmacy?: 1 - Never     Information entered by ::  ,RMA   Activities of Daily Living    01/05/2023    1:12 PM  In your present state of health, do you have any difficulty performing the following activities:  Hearing? 0  Vision? 0  Difficulty concentrating or making decisions? 0  Walking or climbing stairs? 0  Dressing or bathing? 0  Doing errands, shopping? 0   Preparing Food and eating ? N  Using the Toilet? N  In the past six months, have you accidently leaked urine? N  Do you have problems with loss of bowel control? N  Managing your Medications? N  Managing your Finances? N  Housekeeping or managing your Housekeeping? N    Patient Care Team: Corwin Levins, MD as PCP - General (Internal Medicine)  Indicate any recent Medical Services you may have received from other than [facility] providers in the past year (date may be approximate).     Assessment:   This is a routine wellness examination for Chrisy.  Hearing/Vision  screen Hearing Screening - Comments:: Denies hearing difficulties   Vision Screening - Comments:: Wears eyeglasses  Dietary issues and exercise activities discussed:     Goals Addressed               This Visit's Progress     Patient Stated (pt-stated)        Plans to work as long as she can.      Depression Screen    01/05/2023    1:21 PM 12/16/2022    8:36 AM 07/01/2022    9:48 AM 12/10/2021    8:18 AM 12/10/2021    8:04 AM 12/04/2020    8:15 AM 12/04/2020    8:02 AM  PHQ 2/9 Scores  PHQ - 2 Score 0 0 0 0 0 0 0  PHQ- 9 Score 2  0  0      Fall Risk    01/05/2023    1:15 PM 12/16/2022    8:36 AM 07/01/2022    9:48 AM 12/10/2021    8:17 AM 12/10/2021    8:04 AM  Fall Risk   Falls in the past year? 1 1 0 0 0  Number falls in past yr: 0 0 0 0 0  Injury with Fall? 1 1 0 0 0  Comment went to Urgent Care      Risk for fall due to :  History of fall(s) No Fall Risks    Follow up Falls evaluation completed;Falls prevention discussed Falls evaluation completed Falls evaluation completed      MEDICARE RISK AT HOME:  Medicare Risk at Home - 01/05/23 1316     Any stairs in or around the home? Yes    If so, are there any without handrails? Yes    Home free of loose throw rugs in walkways, pet beds, electrical cords, etc? Yes    Adequate lighting in your home to reduce risk of falls? Yes    Life alert? No    Use  of a cane, walker or w/c? No    Grab bars in the bathroom? No    Shower chair or bench in shower? No    Elevated toilet seat or a handicapped toilet? Yes             TIMED UP AND GO:  Was the test performed? No    Cognitive Function:        01/05/2023    1:17 PM  6CIT Screen  What Year? 0 points  What month? 0 points  What time? 0 points  Count back from 20 0 points  Months in reverse 0 points  Repeat phrase 10 points  Total Score 10 points    Immunizations Immunization History  Administered Date(s) Administered   COVID-19, mRNA, vaccine(Comirnaty)12 years and older 03/20/2022   DTaP 09/20/2008   Fluad Quad(high Dose 65+) 02/18/2020   Influenza Split 02/04/2014   Influenza,inj,Quad PF,6+ Mos 02/04/2014   Influenza,inj,Quad PF,6-35 Mos 03/06/2016   Influenza-Unspecified 02/04/2014, 03/06/2016   PFIZER Comirnaty(Gray Top)Covid-19 Tri-Sucrose Vaccine 12/17/2020   PFIZER(Purple Top)SARS-COV-2 Vaccination 06/12/2019, 07/03/2019, 02/21/2020   Pneumococcal Conjugate-13 11/21/2017   Pneumococcal Polysaccharide-23 11/27/2018   Tdap 11/27/2018   Zoster Recombinant(Shingrix) 02/09/2018, 03/23/2018, 02/01/2019   Zoster, Live 10/19/2012    TDAP status: Up to date  Flu Vaccine status: Due, Education has been provided regarding the importance of this vaccine. Advised may receive this vaccine at local pharmacy or Health Dept. Aware to provide a copy of the vaccination record if obtained from  local pharmacy or Health Dept. Verbalized acceptance and understanding.  Pneumococcal vaccine status: Up to date  Covid-19 vaccine status: Completed vaccines  Qualifies for Shingles Vaccine? Yes   Zostavax completed Yes   Shingrix Completed?: Yes  Screening Tests Health Maintenance  Topic Date Due   COVID-19 Vaccine (6 - 2023-24 season) 05/15/2022   INFLUENZA VACCINE  12/22/2022   Colonoscopy  12/24/2023   Medicare Annual Wellness (AWV)  01/05/2024   MAMMOGRAM  02/12/2024    DTaP/Tdap/Td (3 - Td or Tdap) 11/26/2028   Pneumonia Vaccine 90+ Years old  Completed   DEXA SCAN  Completed   Hepatitis C Screening  Completed   Zoster Vaccines- Shingrix  Completed   HPV VACCINES  Aged Out    Health Maintenance  Health Maintenance Due  Topic Date Due   COVID-19 Vaccine (6 - 2023-24 season) 05/15/2022   INFLUENZA VACCINE  12/22/2022    Colorectal cancer screening: Type of screening: Colonoscopy. Completed 12/23/2013. Repeat every 10 years  Mammogram status: Completed 02/11/2022. Repeat every year  Bone Density status: Ordered 01/05/2023. Pt provided with contact info and advised to call to schedule appt.  Lung Cancer Screening: (Low Dose CT Chest recommended if Age 14-80 years, 20 pack-year currently smoking OR have quit w/in 15years.) does not qualify.   Lung Cancer Screening Referral: N/A  Additional Screening:  Hepatitis C Screening: does qualify; Completed 11/14/2016  Vision Screening: Recommended annual ophthalmology exams for early detection of glaucoma and other disorders of the eye. Is the patient up to date with their annual eye exam?  Yes  Who is the provider or what is the name of the office in which the patient attends annual eye exams? [facility] If pt is not established with a provider, would they like to be referred to a provider to establish care? No .   Dental Screening: Recommended annual dental exams for proper oral hygiene   Community Resource Referral / Chronic Care Management: CRR required this visit?  No   CCM required this visit?  No     Plan:     I have personally reviewed and noted the following in the patient's chart:   Medical and social history Use of alcohol, tobacco or illicit drugs  Current medications and supplements including opioid prescriptions. Patient is not currently taking opioid prescriptions. Functional ability and status Nutritional status Physical activity Advanced directives List of other  physicians Hospitalizations, surgeries, and ER visits in previous 12 months Vitals Screenings to include cognitive, depression, and falls Referrals and appointments  In addition, I have reviewed and discussed with patient certain preventive protocols, quality metrics, and best practice recommendations. A written personalized care plan for preventive services as well as general preventive health recommendations were provided to patient.      L , CMA   01/05/2023   After Visit Summary: (MyChart) Due to this being a telephonic visit, the after visit summary with patients personalized plan was offered to patient via MyChart   Nurse Notes: Patient is due for a DEXA and Mammogram screening soon.  She is aware to call and schedule these appointments.  A referral has been placed today DEXA.   Patient had no other concerns today.

## 2023-01-05 NOTE — Patient Instructions (Addendum)
Ms. Beverly Shaw , Thank you for taking time to come for your Medicare Wellness Visit. I appreciate your ongoing commitment to your health goals. Please review the following plan we discussed and let me know if I can assist you in the future.   Referrals/Orders/Follow-Ups/Clinician Recommendations: Remember to call [facility] to schedule your Bone Density and Mammogram soon ([phone]).  It was nice speaking with you today.  Keep up the good work.  This is a list of the screening recommended for you and due dates:  Health Maintenance  Topic Date Due   COVID-19 Vaccine (6 - 2023-24 season) 05/15/2022   Flu Shot  12/22/2022   Colon Cancer Screening  12/24/2023   Medicare Annual Wellness Visit  01/05/2024   Mammogram  02/12/2024   DTaP/Tdap/Td vaccine (3 - Td or Tdap) 11/26/2028   Pneumonia Vaccine  Completed   DEXA scan (bone density measurement)  Completed   Hepatitis C Screening  Completed   Zoster (Shingles) Vaccine  Completed   HPV Vaccine  Aged Out    Advanced directives: (Copy Requested) Please bring a copy of your health care power of attorney and living will to the office to be added to your chart at your convenience.  Next Medicare Annual Wellness Visit scheduled for next year: Yes  Preventive Care 43 Years and Older, Female Preventive care refers to lifestyle choices and visits with your health care provider that can promote health and wellness. What does preventive care include? A yearly physical exam. This is also called an annual well check. Dental exams once or twice a year. Routine eye exams. Ask your health care provider how often you should have your eyes checked. Personal lifestyle choices, including: Daily care of your teeth and gums. Regular physical activity. Eating a healthy diet. Avoiding tobacco and drug use. Limiting alcohol use. Practicing safe sex. Taking low-dose aspirin every day. Taking vitamin and mineral supplements as recommended by your  health care provider. What happens during an annual well check? The services and screenings done by your health care provider during your annual well check will depend on your age, overall health, lifestyle risk factors, and family history of disease. Counseling  Your health care provider may ask you questions about your: Alcohol use. Tobacco use. Drug use. Emotional well-being. Home and relationship well-being. Sexual activity. Eating habits. History of falls. Memory and ability to understand (cognition). Work and work Astronomer. Reproductive health. Screening  You may have the following tests or measurements: Height, weight, and BMI. Blood pressure. Lipid and cholesterol levels. These may be checked every 5 years, or more frequently if you are over 66 years old. Skin check. Lung cancer screening. You may have this screening every year starting at age 70 if you have a 30-pack-year history of smoking and currently smoke or have quit within the past 15 years. Fecal occult blood test (FOBT) of the stool. You may have this test every year starting at age 48. Flexible sigmoidoscopy or colonoscopy. You may have a sigmoidoscopy every 5 years or a colonoscopy every 10 years starting at age 60. Hepatitis C blood test. Hepatitis B blood test. Sexually transmitted disease (STD) testing. Diabetes screening. This is done by checking your blood sugar (glucose) after you have not eaten for a while (fasting). You may have this done every 1-3 years. Bone density scan. This is done to screen for osteoporosis. You may have this done starting at age 73. Mammogram. This may be done every 1-2 years. Talk to your  health care provider about how often you should have regular mammograms. Talk with your health care provider about your test results, treatment options, and if necessary, the need for more tests. Vaccines  Your health care provider may recommend certain vaccines, such as: Influenza vaccine.  This is recommended every year. Tetanus, diphtheria, and acellular pertussis (Tdap, Td) vaccine. You may need a Td booster every 10 years. Zoster vaccine. You may need this after age 35. Pneumococcal 13-valent conjugate (PCV13) vaccine. One dose is recommended after age 70. Pneumococcal polysaccharide (PPSV23) vaccine. One dose is recommended after age 70. Talk to your health care provider about which screenings and vaccines you need and how often you need them. This information is not intended to replace advice given to you by your health care provider. Make sure you discuss any questions you have with your health care provider. Document Released: 06/05/2015 Document Revised: 01/27/2016 Document Reviewed: 03/10/2015 Elsevier Interactive Patient Education  2017 [employer].  Fall Prevention in the Home Falls can cause injuries. They can happen to people of all ages. There are many things you can do to make your home safe and to help prevent falls. What can I do on the outside of my home? Regularly fix the edges of walkways and driveways and fix any cracks. Remove anything that might make you trip as you walk through a door, such as a raised step or threshold. Trim any bushes or trees on the path to your home. Use bright outdoor lighting. Clear any walking paths of anything that might make someone trip, such as rocks or tools. Regularly check to see if handrails are loose or broken. Make sure that both sides of any steps have handrails. Any raised decks and porches should have guardrails on the edges. Have any leaves, snow, or ice cleared regularly. Use sand or salt on walking paths during winter. Clean up any spills in your garage right away. This includes oil or grease spills. What can I do in the bathroom? Use night lights. Install grab bars by the toilet and in the tub and shower. Do not use towel bars as grab bars. Use non-skid mats or decals in the tub or shower. If you need to sit  down in the shower, use a plastic, non-slip stool. Keep the floor dry. Clean up any water that spills on the floor as soon as it happens. Remove soap buildup in the tub or shower regularly. Attach bath mats securely with double-sided non-slip rug tape. Do not have throw rugs and other things on the floor that can make you trip. What can I do in the bedroom? Use night lights. Make sure that you have a light by your bed that is easy to reach. Do not use any sheets or blankets that are too big for your bed. They should not hang down onto the floor. Have a firm chair that has side arms. You can use this for support while you get dressed. Do not have throw rugs and other things on the floor that can make you trip. What can I do in the kitchen? Clean up any spills right away. Avoid walking on wet floors. Keep items that you use a lot in easy-to-reach places. If you need to reach something above you, use a strong step stool that has a grab bar. Keep electrical cords out of the way. Do not use floor polish or wax that makes floors slippery. If you must use wax, use non-skid floor wax. Do not have  throw rugs and other things on the floor that can make you trip. What can I do with my stairs? Do not leave any items on the stairs. Make sure that there are handrails on both sides of the stairs and use them. Fix handrails that are broken or loose. Make sure that handrails are as long as the stairways. Check any carpeting to make sure that it is firmly attached to the stairs. Fix any carpet that is loose or worn. Avoid having throw rugs at the top or bottom of the stairs. If you do have throw rugs, attach them to the floor with carpet tape. Make sure that you have a light switch at the top of the stairs and the bottom of the stairs. If you do not have them, ask someone to add them for you. What else can I do to help prevent falls? Wear shoes that: Do not have high heels. Have rubber bottoms. Are  comfortable and fit you well. Are closed at the toe. Do not wear sandals. If you use a stepladder: Make sure that it is fully opened. Do not climb a closed stepladder. Make sure that both sides of the stepladder are locked into place. Ask someone to hold it for you, if possible. Clearly mark and make sure that you can see: Any grab bars or handrails. First and last steps. Where the edge of each step is. Use tools that help you move around (mobility aids) if they are needed. These include: Canes. Walkers. Scooters. Crutches. Turn on the lights when you go into a dark area. Replace any light bulbs as soon as they burn out. Set up your furniture so you have a clear path. Avoid moving your furniture around. If any of your floors are uneven, fix them. If there are any pets around you, be aware of where they are. Review your medicines with your doctor. Some medicines can make you feel dizzy. This can increase your chance of falling. Ask your doctor what other things that you can do to help prevent falls. This information is not intended to replace advice given to you by your health care provider. Make sure you discuss any questions you have with your health care provider. Document Released: 03/05/2009 Document Revised: 10/15/2015 Document Reviewed: 06/13/2014 Elsevier Interactive Patient Education  2017 [employer].

## 2023-01-09 ENCOUNTER — Other Ambulatory Visit: Payer: Self-pay | Admitting: Internal Medicine

## 2023-01-09 DIAGNOSIS — Z1231 Encounter for screening mammogram for malignant neoplasm of breast: Secondary | ICD-10-CM

## 2023-01-13 ENCOUNTER — Ambulatory Visit (INDEPENDENT_AMBULATORY_CARE_PROVIDER_SITE_OTHER): Payer: Medicare HMO

## 2023-01-13 ENCOUNTER — Ambulatory Visit: Payer: Medicare HMO | Admitting: Internal Medicine

## 2023-01-13 ENCOUNTER — Ambulatory Visit
Admission: RE | Admit: 2023-01-13 | Discharge: 2023-01-13 | Disposition: A | Discharge status: Home / Self Care | Payer: Medicare HMO | Source: Ambulatory Visit | Attending: Internal Medicine | Admitting: Internal Medicine

## 2023-01-13 DIAGNOSIS — Z780 Asymptomatic menopausal state: Secondary | ICD-10-CM | POA: Diagnosis not present

## 2023-01-13 DIAGNOSIS — M810 Age-related osteoporosis without current pathological fracture: Secondary | ICD-10-CM | POA: Diagnosis not present

## 2023-01-13 DIAGNOSIS — Z0000 Encounter for general adult medical examination without abnormal findings: Secondary | ICD-10-CM

## 2023-01-13 MED ORDER — DENOSUMAB 60 MG/ML ~~LOC~~ SOSY
60.0000 mg | PREFILLED_SYRINGE | Freq: Once | SUBCUTANEOUS | Status: AC
  Administered 2023-01-13: 60 mg via SUBCUTANEOUS

## 2023-01-13 NOTE — Progress Notes (Signed)
Pt was given Prolia Injection w/o any complications.

## 2023-01-26 DIAGNOSIS — L578 Other skin changes due to chronic exposure to nonionizing radiation: Secondary | ICD-10-CM | POA: Diagnosis not present

## 2023-01-26 DIAGNOSIS — L814 Other melanin hyperpigmentation: Secondary | ICD-10-CM | POA: Diagnosis not present

## 2023-01-26 DIAGNOSIS — L821 Other seborrheic keratosis: Secondary | ICD-10-CM | POA: Diagnosis not present

## 2023-01-26 DIAGNOSIS — D2271 Melanocytic nevi of right lower limb, including hip: Secondary | ICD-10-CM | POA: Diagnosis not present

## 2023-01-26 DIAGNOSIS — D485 Neoplasm of uncertain behavior of skin: Secondary | ICD-10-CM | POA: Diagnosis not present

## 2023-01-26 DIAGNOSIS — L570 Actinic keratosis: Secondary | ICD-10-CM | POA: Diagnosis not present

## 2023-01-26 DIAGNOSIS — D225 Melanocytic nevi of trunk: Secondary | ICD-10-CM | POA: Diagnosis not present

## 2023-01-26 DIAGNOSIS — L7211 Pilar cyst: Secondary | ICD-10-CM | POA: Diagnosis not present

## 2023-01-26 DIAGNOSIS — L820 Inflamed seborrheic keratosis: Secondary | ICD-10-CM | POA: Diagnosis not present

## 2023-01-27 DIAGNOSIS — M5442 Lumbago with sciatica, left side: Secondary | ICD-10-CM | POA: Diagnosis not present

## 2023-01-27 DIAGNOSIS — M9904 Segmental and somatic dysfunction of sacral region: Secondary | ICD-10-CM | POA: Diagnosis not present

## 2023-01-27 DIAGNOSIS — M9903 Segmental and somatic dysfunction of lumbar region: Secondary | ICD-10-CM | POA: Diagnosis not present

## 2023-01-27 DIAGNOSIS — M5137 Other intervertebral disc degeneration, lumbosacral region: Secondary | ICD-10-CM | POA: Diagnosis not present

## 2023-01-27 DIAGNOSIS — M5136 Other intervertebral disc degeneration, lumbar region: Secondary | ICD-10-CM | POA: Diagnosis not present

## 2023-02-15 ENCOUNTER — Ambulatory Visit
Admission: RE | Admit: 2023-02-15 | Discharge: 2023-02-15 | Disposition: A | Discharge status: Home / Self Care | Payer: Medicare HMO | Source: Ambulatory Visit | Attending: Internal Medicine | Admitting: Internal Medicine

## 2023-02-15 DIAGNOSIS — Z1231 Encounter for screening mammogram for malignant neoplasm of breast: Secondary | ICD-10-CM

## 2023-02-24 DIAGNOSIS — M51361 Other intervertebral disc degeneration, lumbar region with lower extremity pain only: Secondary | ICD-10-CM | POA: Diagnosis not present

## 2023-02-24 DIAGNOSIS — M5442 Lumbago with sciatica, left side: Secondary | ICD-10-CM | POA: Diagnosis not present

## 2023-02-24 DIAGNOSIS — M9903 Segmental and somatic dysfunction of lumbar region: Secondary | ICD-10-CM | POA: Diagnosis not present

## 2023-02-24 DIAGNOSIS — M9904 Segmental and somatic dysfunction of sacral region: Secondary | ICD-10-CM | POA: Diagnosis not present

## 2023-02-24 DIAGNOSIS — M51371 Other intervertebral disc degeneration, lumbosacral region with lower extremity pain only: Secondary | ICD-10-CM | POA: Diagnosis not present

## 2023-03-15 DIAGNOSIS — L570 Actinic keratosis: Secondary | ICD-10-CM | POA: Diagnosis not present

## 2023-03-24 DIAGNOSIS — M9904 Segmental and somatic dysfunction of sacral region: Secondary | ICD-10-CM | POA: Diagnosis not present

## 2023-03-24 DIAGNOSIS — M51362 Other intervertebral disc degeneration, lumbar region with discogenic back pain and lower extremity pain: Secondary | ICD-10-CM | POA: Diagnosis not present

## 2023-03-24 DIAGNOSIS — M51372 Other intervertebral disc degeneration, lumbosacral region with discogenic back pain and lower extremity pain: Secondary | ICD-10-CM | POA: Diagnosis not present

## 2023-03-24 DIAGNOSIS — M9903 Segmental and somatic dysfunction of lumbar region: Secondary | ICD-10-CM | POA: Diagnosis not present

## 2023-03-24 DIAGNOSIS — M5442 Lumbago with sciatica, left side: Secondary | ICD-10-CM | POA: Diagnosis not present

## 2023-04-28 DIAGNOSIS — M9903 Segmental and somatic dysfunction of lumbar region: Secondary | ICD-10-CM | POA: Diagnosis not present

## 2023-04-28 DIAGNOSIS — M51372 Other intervertebral disc degeneration, lumbosacral region with discogenic back pain and lower extremity pain: Secondary | ICD-10-CM | POA: Diagnosis not present

## 2023-04-28 DIAGNOSIS — M51362 Other intervertebral disc degeneration, lumbar region with discogenic back pain and lower extremity pain: Secondary | ICD-10-CM | POA: Diagnosis not present

## 2023-04-28 DIAGNOSIS — M5442 Lumbago with sciatica, left side: Secondary | ICD-10-CM | POA: Diagnosis not present

## 2023-04-28 DIAGNOSIS — M9904 Segmental and somatic dysfunction of sacral region: Secondary | ICD-10-CM | POA: Diagnosis not present

## 2023-05-26 DIAGNOSIS — M51372 Other intervertebral disc degeneration, lumbosacral region with discogenic back pain and lower extremity pain: Secondary | ICD-10-CM | POA: Diagnosis not present

## 2023-05-26 DIAGNOSIS — M5442 Lumbago with sciatica, left side: Secondary | ICD-10-CM | POA: Diagnosis not present

## 2023-05-26 DIAGNOSIS — M9903 Segmental and somatic dysfunction of lumbar region: Secondary | ICD-10-CM | POA: Diagnosis not present

## 2023-05-26 DIAGNOSIS — M9904 Segmental and somatic dysfunction of sacral region: Secondary | ICD-10-CM | POA: Diagnosis not present

## 2023-05-26 DIAGNOSIS — M51362 Other intervertebral disc degeneration, lumbar region with discogenic back pain and lower extremity pain: Secondary | ICD-10-CM | POA: Diagnosis not present

## 2023-06-20 ENCOUNTER — Encounter: Payer: Self-pay | Admitting: Internal Medicine

## 2023-06-20 ENCOUNTER — Ambulatory Visit (INDEPENDENT_AMBULATORY_CARE_PROVIDER_SITE_OTHER): Payer: Medicare HMO | Admitting: Internal Medicine

## 2023-06-20 VITALS — BP 142/92 | HR 65 | Ht 62.5 in | Wt 127.0 lb

## 2023-06-20 DIAGNOSIS — H9313 Tinnitus, bilateral: Secondary | ICD-10-CM | POA: Diagnosis not present

## 2023-06-20 DIAGNOSIS — H6123 Impacted cerumen, bilateral: Secondary | ICD-10-CM

## 2023-06-20 DIAGNOSIS — H6993 Unspecified Eustachian tube disorder, bilateral: Secondary | ICD-10-CM

## 2023-06-20 DIAGNOSIS — J309 Allergic rhinitis, unspecified: Secondary | ICD-10-CM | POA: Diagnosis not present

## 2023-06-20 MED ORDER — PREDNISONE 10 MG PO TABS: ORAL_TABLET | ORAL | 0 refills | Status: DC

## 2023-06-20 MED ORDER — GUAIFENESIN ER 600 MG PO TB12: 1200.0000 mg | ORAL_TABLET | Freq: Two times a day (BID) | ORAL | 1 refills | Status: DC | PRN

## 2023-06-20 NOTE — Progress Notes (Signed)
Patient ID: Beverly Shaw, female   DOB: [birth year], 73 y.o.   MRN: [id number]        Chief Complaint: follow up allergies, bilateral eustachian tube dysfunction, bilateral ear wax, tinnitus       HPI:  Beverly Shaw is a 73 y.o. female here with c/o recurring tinnitus since x 2 yrs, now with 2 wks worsening bilateral eaer fullness, popping and crackling.  Does have several wks ongoing nasal allergy symptoms with clearish congestion, itch and sneezing, without fever, pain, ST, cough, swelling or wheezing.   Pt denies polydipsia, polyuria, or new focal neuro s/s.    Pt denies fever, wt loss, night sweats, loss of appetite, or other constitutional symptoms  Causes increased anxiety.         Wt Readings from Last 3 Encounters:  06/20/23 127 lb (57.6 kg)  01/05/23 125 lb (56.7 kg)  12/16/22 125 lb (56.7 kg)   BP Readings from Last 3 Encounters:  06/20/23 (!) 142/92  12/16/22 136/88  07/01/22 120/72         Past Medical History:  Diagnosis Date   Atrophic kidney 11/18/2015   Episodic low back pain    GERD (gastroesophageal reflux disease)    Heart murmur    Migraines    Osteopenia 10/15/2010   Osteoporosis, unspecified 10/19/2012   Positive TB test    Positive TB skin test   Past Surgical History:  Procedure Laterality Date   ABDOMINAL HYSTERECTOMY  1996   TUBAL LIGATION      reports that she has quit smoking. She has never used smokeless tobacco. She reports current alcohol use. She reports that she does not use drugs. family history includes Diabetes in an other family member; Heart disease in her father and mother. Allergies  Allergen Reactions   Other Other (See Comments)   Brimonidine Tartrate Other (See Comments)   Gabapentin Other (See Comments)    sedation   Mobic [Meloxicam] Nausea And Vomiting   Yellow Jacket Venom [Bee Venom]    Current Outpatient Medications on File Prior to Visit  Medication Sig Dispense Refill   calcium carbonate (OS-CAL) 600 MG TABS Take 600 mg  by mouth daily.       denosumab (PROLIA) 60 MG/ML SOSY injection      EPINEPHrine 0.3 mg/0.3 mL IJ SOAJ injection Inject 0.3 mg into the muscle as needed for anaphylaxis. 2 each 1   Fezolinetant (VEOZAH) 45 MG TABS Take 1 tablet (45 mg total) by mouth daily. 90 tablet 3   Multiple Vitamin (MULTIVITAMIN) capsule Take 1 capsule by mouth daily.       Omega-3 Fatty Acids (FISH OIL) 1000 MG CAPS Take by mouth.     pantoprazole (PROTONIX) 40 MG tablet Take 1 tablet (40 mg total) by mouth daily. 90 tablet 3   rosuvastatin (CRESTOR) 5 MG tablet Take 1 tablet (5 mg total) by mouth daily. 90 tablet 3   No current facility-administered medications on file prior to visit.        ROS:  All others reviewed and negative.  Objective        PE:  BP (!) 142/92 (BP Location: Left Arm, Patient Position: Sitting, Cuff Size: Normal)   Pulse 65   Ht 5' 2.5" (1.588 m)   Wt 127 lb (57.6 kg)   SpO2 95%   BMI 22.86 kg/m                 Constitutional: Pt appears in NAD  HENT: Head: NCAT.                Right Ear: External ear normal.                 Left Ear: External ear normal. Bilat tm's with mild erythema.  Max sinus areas non tender.  Pharynx with mild erythema, no exudate;                Bilat wax impactions resolved               Eyes: . Pupils are equal, round, and reactive to light.  Conjunctivae and EOM are normal               Nose: without d/c or deformity               Neck: Neck supple. Gross normal ROM               Cardiovascular: Normal rate and regular rhythm.                 Pulmonary/Chest: Effort normal and breath sounds without rales or wheezing.                               Neurological: Pt is alert. At baseline orientation, motor grossly intact               Skin: Skin is warm. No rashes, no other new lesions, LE edema - none               Psychiatric: Pt behavior is normal without agitation   Micro: none  Cardiac tracings I have personally interpreted today:   none  Pertinent Radiological findings (summarize): none   Lab Results  Component Value Date   WBC 5.2 12/09/2022   HGB 13.2 12/09/2022   HCT 40.3 12/09/2022   PLT 178.0 12/09/2022   GLUCOSE 98 12/09/2022   CHOL 158 12/09/2022   TRIG 132.0 12/09/2022   HDL 53.00 12/09/2022   LDLDIRECT 103.0 11/27/2020   LDLCALC 79 12/09/2022   ALT 18 12/09/2022   AST 21 12/09/2022   NA 143 12/09/2022   K 4.5 12/09/2022   CL 106 12/09/2022   CREATININE 0.77 12/09/2022   BUN 19 12/09/2022   CO2 29 12/09/2022   TSH 1.74 12/09/2022   HGBA1C 5.7 12/09/2022   Assessment/Plan:  Beverly Shaw is a 73 y.o. White or Caucasian [1] female with  has a past medical history of Atrophic kidney (11/18/2015), Episodic low back pain, GERD (gastroesophageal reflux disease), Heart murmur, Migraines, Osteopenia (10/15/2010), Osteoporosis, unspecified (10/19/2012), and Positive TB test.  Allergic rhinitis Mild to mod, for prednisone taper asd, to f/u any worsening symptoms or concerns  Dysfunction of Eustachian tube, bilateral Mild to mod, for mucinex bid prn, to f/u any worsening symptoms or concerns  Bilateral impacted cerumen Resolved today, hearing improved,  to f/u any worsening symptoms or concerns  Tinnitus Mild recurrring, etiology unclear, for tx a as above  Followup: Return in about 6 months (around 12/20/2023).  Oliver Barre, MD 06/24/2023 11:12 AM [facility]

## 2023-06-20 NOTE — Patient Instructions (Addendum)
Your ears were cleared of wax today  Please take all new medication as prescribed  - the prednisone short course, and the mucinex   Please continue all other medications as before, including the claritin and the eye drops.  Please have the pharmacy call with any other refills you may need.  Please keep your appointments with your specialists as you may have planned  Please make an Appointment to return in July 30, or sooner if needed

## 2023-06-24 ENCOUNTER — Encounter: Payer: Self-pay | Admitting: Internal Medicine

## 2023-06-24 DIAGNOSIS — H6123 Impacted cerumen, bilateral: Secondary | ICD-10-CM | POA: Insufficient documentation

## 2023-06-24 DIAGNOSIS — H6993 Unspecified Eustachian tube disorder, bilateral: Secondary | ICD-10-CM | POA: Insufficient documentation

## 2023-06-24 DIAGNOSIS — H9319 Tinnitus, unspecified ear: Secondary | ICD-10-CM | POA: Insufficient documentation

## 2023-06-24 NOTE — Assessment & Plan Note (Signed)
Mild to mod, for prednisone taper asd, to f/u any worsening symptoms or concerns

## 2023-06-24 NOTE — Assessment & Plan Note (Signed)
Resolved today,hearing improved,  to f/u any worsening symptoms or concerns

## 2023-06-24 NOTE — Assessment & Plan Note (Signed)
Mild to mod, for mucinex bid prn,  to f/u any worsening symptoms or concerns 

## 2023-06-24 NOTE — Assessment & Plan Note (Signed)
Mild recurrring, etiology unclear, for tx a as above

## 2023-06-30 DIAGNOSIS — M9903 Segmental and somatic dysfunction of lumbar region: Secondary | ICD-10-CM | POA: Diagnosis not present

## 2023-06-30 DIAGNOSIS — M5442 Lumbago with sciatica, left side: Secondary | ICD-10-CM | POA: Diagnosis not present

## 2023-06-30 DIAGNOSIS — M9904 Segmental and somatic dysfunction of sacral region: Secondary | ICD-10-CM | POA: Diagnosis not present

## 2023-06-30 DIAGNOSIS — M51371 Other intervertebral disc degeneration, lumbosacral region with lower extremity pain only: Secondary | ICD-10-CM | POA: Diagnosis not present

## 2023-06-30 DIAGNOSIS — M51361 Other intervertebral disc degeneration, lumbar region with lower extremity pain only: Secondary | ICD-10-CM | POA: Diagnosis not present

## 2023-07-12 ENCOUNTER — Other Ambulatory Visit: Payer: Medicare HMO

## 2023-07-13 ENCOUNTER — Other Ambulatory Visit: Payer: Medicare HMO

## 2023-07-28 DIAGNOSIS — M9904 Segmental and somatic dysfunction of sacral region: Secondary | ICD-10-CM | POA: Diagnosis not present

## 2023-07-28 DIAGNOSIS — M5442 Lumbago with sciatica, left side: Secondary | ICD-10-CM | POA: Diagnosis not present

## 2023-07-28 DIAGNOSIS — M9903 Segmental and somatic dysfunction of lumbar region: Secondary | ICD-10-CM | POA: Diagnosis not present

## 2023-07-28 DIAGNOSIS — M51371 Other intervertebral disc degeneration, lumbosacral region with lower extremity pain only: Secondary | ICD-10-CM | POA: Diagnosis not present

## 2023-07-28 DIAGNOSIS — M51361 Other intervertebral disc degeneration, lumbar region with lower extremity pain only: Secondary | ICD-10-CM | POA: Diagnosis not present

## 2023-08-03 ENCOUNTER — Other Ambulatory Visit: Payer: Self-pay

## 2023-08-03 DIAGNOSIS — M810 Age-related osteoporosis without current pathological fracture: Secondary | ICD-10-CM

## 2023-08-03 MED ORDER — DENOSUMAB 60 MG/ML ~~LOC~~ SOSY
60.0000 mg | PREFILLED_SYRINGE | Freq: Once | SUBCUTANEOUS | Status: AC
  Administered 2023-09-20: 60 mg via SUBCUTANEOUS

## 2023-08-25 DIAGNOSIS — M9904 Segmental and somatic dysfunction of sacral region: Secondary | ICD-10-CM | POA: Diagnosis not present

## 2023-08-25 DIAGNOSIS — M51371 Other intervertebral disc degeneration, lumbosacral region with lower extremity pain only: Secondary | ICD-10-CM | POA: Diagnosis not present

## 2023-08-25 DIAGNOSIS — M5442 Lumbago with sciatica, left side: Secondary | ICD-10-CM | POA: Diagnosis not present

## 2023-08-25 DIAGNOSIS — M51361 Other intervertebral disc degeneration, lumbar region with lower extremity pain only: Secondary | ICD-10-CM | POA: Diagnosis not present

## 2023-08-25 DIAGNOSIS — M9903 Segmental and somatic dysfunction of lumbar region: Secondary | ICD-10-CM | POA: Diagnosis not present

## 2023-09-01 ENCOUNTER — Telehealth: Payer: Self-pay

## 2023-09-01 ENCOUNTER — Other Ambulatory Visit (HOSPITAL_COMMUNITY): Payer: Self-pay

## 2023-09-01 NOTE — Telephone Encounter (Signed)
 Prolia VOB initiated via [email or web address]  Next Prolia inj DUE: NOW

## 2023-09-06 NOTE — Telephone Encounter (Signed)
 Pharmacy Patient Advocate Encounter   Received notification from  [employer] Portal that prior authorization for PROLIA is required/requested.   Insurance verification completed.   The patient is insured through [employer] .   Per test claim: PA required; PA submitted to above mentioned insurance via [employer]/confirmation #/EOC [id number] Status is pending

## 2023-09-06 NOTE — Telephone Encounter (Signed)
 Marland Kitchen

## 2023-09-07 ENCOUNTER — Other Ambulatory Visit (HOSPITAL_COMMUNITY): Payer: Self-pay

## 2023-09-07 NOTE — Telephone Encounter (Signed)
 Pt ready for scheduling for PROLIA on or after : 09/07/23  Option# 1: Buy/Bill (Office supplied medication)  Out-of-pocket cost due at time of clinic visit: $357  Number of injection/visits approved: 2  Primary: [employer] Prolia co-insurance: 20% Admin fee co-insurance: 20%  Secondary: --- Prolia co-insurance:  Admin fee co-insurance:   Medical Benefit Details: Date Benefits were checked: 09/01/23 Deductible: NO/ Coinsurance: 20%/ Admin Fee: 20%  Prior Auth: APPROVED PA# [id number] Expiration Date: 09/06/23-09/05/24  # of doses approved: 2 ----------------------------------------------------------------------- Option# 2- Med Obtained from pharmacy:  Pharmacy benefit: Copay $645.79 (Paid to pharmacy) Admin Fee: 20% (Pay at clinic)  Prior Auth: N/A PA# Expiration Date:   # of doses approved:   If patient wants fill through the pharmacy benefit please send prescription to: [employer], and include estimated need by date in rx notes. Pharmacy will ship medication directly to the office.  Patient NOT eligible for Prolia Copay Card. Copay Card can make patient's cost as little as $25. Link to apply: [email or web address]://[email or web address]  ** This summary of benefits is an estimation of the patient's out-of-pocket cost. Exact cost may very based on individual plan coverage.

## 2023-09-20 ENCOUNTER — Ambulatory Visit

## 2023-09-20 DIAGNOSIS — M810 Age-related osteoporosis without current pathological fracture: Secondary | ICD-10-CM | POA: Diagnosis not present

## 2023-09-20 MED ORDER — DENOSUMAB 60 MG/ML ~~LOC~~ SOSY
60.0000 mg | PREFILLED_SYRINGE | Freq: Once | SUBCUTANEOUS | Status: AC
  Administered 2024-03-21: 60 mg via SUBCUTANEOUS

## 2023-09-20 NOTE — Progress Notes (Signed)
 Pt was given Prolia  injection w/o any complications at this time.

## 2023-09-22 DIAGNOSIS — M5442 Lumbago with sciatica, left side: Secondary | ICD-10-CM | POA: Diagnosis not present

## 2023-09-22 DIAGNOSIS — M9904 Segmental and somatic dysfunction of sacral region: Secondary | ICD-10-CM | POA: Diagnosis not present

## 2023-09-22 DIAGNOSIS — M51361 Other intervertebral disc degeneration, lumbar region with lower extremity pain only: Secondary | ICD-10-CM | POA: Diagnosis not present

## 2023-09-22 DIAGNOSIS — M51371 Other intervertebral disc degeneration, lumbosacral region with lower extremity pain only: Secondary | ICD-10-CM | POA: Diagnosis not present

## 2023-09-22 DIAGNOSIS — M9903 Segmental and somatic dysfunction of lumbar region: Secondary | ICD-10-CM | POA: Diagnosis not present

## 2023-10-27 DIAGNOSIS — M51361 Other intervertebral disc degeneration, lumbar region with lower extremity pain only: Secondary | ICD-10-CM | POA: Diagnosis not present

## 2023-10-27 DIAGNOSIS — M9904 Segmental and somatic dysfunction of sacral region: Secondary | ICD-10-CM | POA: Diagnosis not present

## 2023-10-27 DIAGNOSIS — M51371 Other intervertebral disc degeneration, lumbosacral region with lower extremity pain only: Secondary | ICD-10-CM | POA: Diagnosis not present

## 2023-10-27 DIAGNOSIS — M5442 Lumbago with sciatica, left side: Secondary | ICD-10-CM | POA: Diagnosis not present

## 2023-10-27 DIAGNOSIS — M9903 Segmental and somatic dysfunction of lumbar region: Secondary | ICD-10-CM | POA: Diagnosis not present

## 2023-12-01 DIAGNOSIS — M51371 Other intervertebral disc degeneration, lumbosacral region with lower extremity pain only: Secondary | ICD-10-CM | POA: Diagnosis not present

## 2023-12-01 DIAGNOSIS — M9903 Segmental and somatic dysfunction of lumbar region: Secondary | ICD-10-CM | POA: Diagnosis not present

## 2023-12-01 DIAGNOSIS — M51361 Other intervertebral disc degeneration, lumbar region with lower extremity pain only: Secondary | ICD-10-CM | POA: Diagnosis not present

## 2023-12-01 DIAGNOSIS — M9904 Segmental and somatic dysfunction of sacral region: Secondary | ICD-10-CM | POA: Diagnosis not present

## 2023-12-01 DIAGNOSIS — M5442 Lumbago with sciatica, left side: Secondary | ICD-10-CM | POA: Diagnosis not present

## 2023-12-15 ENCOUNTER — Other Ambulatory Visit: Payer: Self-pay | Admitting: Internal Medicine

## 2023-12-15 ENCOUNTER — Other Ambulatory Visit

## 2023-12-15 DIAGNOSIS — E559 Vitamin D deficiency, unspecified: Secondary | ICD-10-CM

## 2023-12-15 DIAGNOSIS — E538 Deficiency of other specified B group vitamins: Secondary | ICD-10-CM | POA: Diagnosis not present

## 2023-12-15 DIAGNOSIS — R739 Hyperglycemia, unspecified: Secondary | ICD-10-CM

## 2023-12-15 DIAGNOSIS — E782 Mixed hyperlipidemia: Secondary | ICD-10-CM

## 2023-12-15 LAB — URINALYSIS, ROUTINE W REFLEX MICROSCOPIC
Bilirubin Urine: NEGATIVE
Ketones, ur: NEGATIVE
Nitrite: NEGATIVE
Specific Gravity, Urine: 1.02 (ref 1.000–1.030)
Total Protein, Urine: NEGATIVE
Urine Glucose: NEGATIVE
Urobilinogen, UA: 0.2 (ref 0.0–1.0)
pH: 6 (ref 5.0–8.0)

## 2023-12-15 LAB — LIPID PANEL
Cholesterol: 158 mg/dL (ref 0–200)
HDL: 52.4 mg/dL (ref >39.00)
LDL Cholesterol: 68 mg/dL (ref 0–99)
NonHDL: 105.85
Total CHOL/HDL Ratio: 3
Triglycerides: 187 mg/dL — ABNORMAL HIGH (ref 0.0–149.0)
VLDL: 37.4 mg/dL (ref 0.0–40.0)

## 2023-12-15 LAB — MICROALBUMIN / CREATININE URINE RATIO
Creatinine,U: 218.6 mg/dL
Microalb Creat Ratio: 3.8 mg/g (ref 0.0–30.0)
Microalb, Ur: 0.8 mg/dL (ref 0.0–1.9)

## 2023-12-15 LAB — BASIC METABOLIC PANEL WITH GFR
BUN: 20 mg/dL (ref 6–23)
CO2: 30 meq/L (ref 19–32)
Calcium: 8.8 mg/dL (ref 8.4–10.5)
Chloride: 105 meq/L (ref 96–112)
Creatinine, Ser: 0.74 mg/dL (ref 0.40–1.20)
GFR: 81.35 mL/min (ref >60.00)
Glucose, Bld: 99 mg/dL (ref 70–99)
Potassium: 4 meq/L (ref 3.5–5.1)
Sodium: 142 meq/L (ref 135–145)

## 2023-12-15 LAB — HEPATIC FUNCTION PANEL
ALT: 21 U/L (ref 0–35)
AST: 22 U/L (ref 0–37)
Albumin: 4.2 g/dL (ref 3.5–5.2)
Alkaline Phosphatase: 35 U/L — ABNORMAL LOW (ref 39–117)
Bilirubin, Direct: 0.1 mg/dL (ref 0.0–0.3)
Total Bilirubin: 0.6 mg/dL (ref 0.2–1.2)
Total Protein: 6.3 g/dL (ref 6.0–8.3)

## 2023-12-15 LAB — CBC WITH DIFFERENTIAL/PLATELET
Basophils Absolute: 0 K/uL (ref 0.0–0.1)
Basophils Relative: 0.5 % (ref 0.0–3.0)
Eosinophils Absolute: 0.1 K/uL (ref 0.0–0.7)
Eosinophils Relative: 1.5 % (ref 0.0–5.0)
HCT: 40 % (ref 36.0–46.0)
Hemoglobin: 13.4 g/dL (ref 12.0–15.0)
Lymphocytes Relative: 40.5 % (ref 12.0–46.0)
Lymphs Abs: 2.5 K/uL (ref 0.7–4.0)
MCHC: 33.6 g/dL (ref 30.0–36.0)
MCV: 87.3 fl (ref 78.0–100.0)
Monocytes Absolute: 0.5 K/uL (ref 0.1–1.0)
Monocytes Relative: 8.4 % (ref 3.0–12.0)
Neutro Abs: 3 K/uL (ref 1.4–7.7)
Neutrophils Relative %: 49.1 % (ref 43.0–77.0)
Platelets: 179 K/uL (ref 150.0–400.0)
RBC: 4.58 Mil/uL (ref 3.87–5.11)
RDW: 13.3 % (ref 11.5–15.5)
WBC: 6.2 K/uL (ref 4.0–10.5)

## 2023-12-15 LAB — TSH: TSH: 1.14 u[IU]/mL (ref 0.35–5.50)

## 2023-12-15 LAB — VITAMIN D 25 HYDROXY (VIT D DEFICIENCY, FRACTURES): VITD: 40.84 ng/mL (ref 30.00–100.00)

## 2023-12-15 LAB — HEMOGLOBIN A1C: Hgb A1c MFr Bld: 5.8 % (ref 4.6–6.5)

## 2023-12-15 LAB — VITAMIN B12: Vitamin B-12: 637 pg/mL (ref 211–911)

## 2023-12-20 ENCOUNTER — Ambulatory Visit (INDEPENDENT_AMBULATORY_CARE_PROVIDER_SITE_OTHER)

## 2023-12-20 ENCOUNTER — Encounter: Payer: Self-pay | Admitting: Internal Medicine

## 2023-12-20 ENCOUNTER — Ambulatory Visit (INDEPENDENT_AMBULATORY_CARE_PROVIDER_SITE_OTHER): Payer: Medicare HMO | Admitting: Internal Medicine

## 2023-12-20 VITALS — BP 168/84 | HR 59 | Temp 97.8°F | Ht 62.5 in | Wt 131.0 lb

## 2023-12-20 DIAGNOSIS — Z1211 Encounter for screening for malignant neoplasm of colon: Secondary | ICD-10-CM

## 2023-12-20 DIAGNOSIS — M1712 Unilateral primary osteoarthritis, left knee: Secondary | ICD-10-CM | POA: Diagnosis not present

## 2023-12-20 DIAGNOSIS — E782 Mixed hyperlipidemia: Secondary | ICD-10-CM

## 2023-12-20 DIAGNOSIS — Z0000 Encounter for general adult medical examination without abnormal findings: Secondary | ICD-10-CM

## 2023-12-20 DIAGNOSIS — Z23 Encounter for immunization: Secondary | ICD-10-CM | POA: Diagnosis not present

## 2023-12-20 DIAGNOSIS — E559 Vitamin D deficiency, unspecified: Secondary | ICD-10-CM | POA: Diagnosis not present

## 2023-12-20 DIAGNOSIS — M810 Age-related osteoporosis without current pathological fracture: Secondary | ICD-10-CM

## 2023-12-20 DIAGNOSIS — E538 Deficiency of other specified B group vitamins: Secondary | ICD-10-CM

## 2023-12-20 DIAGNOSIS — Z0001 Encounter for general adult medical examination with abnormal findings: Secondary | ICD-10-CM

## 2023-12-20 DIAGNOSIS — R739 Hyperglycemia, unspecified: Secondary | ICD-10-CM

## 2023-12-20 DIAGNOSIS — M25562 Pain in left knee: Secondary | ICD-10-CM | POA: Diagnosis not present

## 2023-12-20 NOTE — Patient Instructions (Signed)
 You had the Prevnar 20 pneumonia shot today  Please continue all other medications as before, and refills have been done if requested.  Please have the pharmacy call with any other refills you may need.  Please continue your efforts at being more active, low cholesterol diet, and weight control.  You are otherwise up to date with prevention measures today.  Please keep your appointments with your specialists as you may have planned  You will be contacted regarding the referral for: colonoscopy with Dr Abran, as well as Sports Medicine  Please go to the XRAY Department in the first floor for the x-ray testing  You will be contacted by phone if any changes need to be made immediately.  Otherwise, you will receive a letter about your results with an explanation, but please check with MyChart first.  Please make an Appointment to return for your 1 year visit, or sooner if needed, with Lab testing by Appointment as well, to be done about 3-5 days before at the FIRST FLOOR Lab (so this is for TWO appointments - please see the scheduling desk as you leave)

## 2023-12-20 NOTE — Assessment & Plan Note (Signed)
 Lab Results  Component Value Date   HGBA1C 5.8 12/15/2023   Stable, pt to continue current medical treatment  - diet, wt control

## 2023-12-20 NOTE — Assessment & Plan Note (Signed)
 Pt to continue prolia  asd, for f/u DXA next yr

## 2023-12-20 NOTE — Assessment & Plan Note (Signed)
 Lab Results  Component Value Date   LDLCALC 68 12/15/2023   Stable, pt to continue current statin crestor  5 mg qd

## 2023-12-20 NOTE — Assessment & Plan Note (Signed)
 Pt with recent onset worsening pain - for left knee xray, and refer sport medicine, may need cortisone

## 2023-12-20 NOTE — Assessment & Plan Note (Signed)
 Age and sex appropriate education and counseling updated with regular exercise and diet Referrals for preventative services - for colonoscopy Immunizations addressed - for Prevnar 20 today Smoking counseling  - none needed Evidence for depression or other mood disorder - none significant Most recent labs reviewed. I have personally reviewed and have noted: 1) the patient's medical and social history 2) The patient's current medications and supplements 3) The patient's height, weight, and BMI have been recorded in the chart

## 2023-12-20 NOTE — Progress Notes (Signed)
 Patient ID: Beverly Shaw, female   DOB: 1950/07/18, 73 y.o.   MRN: [id number]         Chief Complaint:: wellness exam and left knee and leg pain throbbing at night, hld, hyperglycemia       HPI:  Beverly Shaw is a 73 y.o. female here for wellness exam; due for colonoscopy, for prevnar 20 today, o/w up to date                        Also Pt denies chest pain, increased sob or doe, wheezing, orthopnea, PND, increased LE swelling, palpitations, dizziness or syncope.   Pt denies polydipsia, polyuria, or new focal neuro s/s.   Has persistent 2 mo left knee pain and bony swelling, worse at night mild to mod with throbbing.  No giveaways or falls.     Wt Readings from Last 3 Encounters:  12/20/23 131 lb (59.4 kg)  06/20/23 127 lb (57.6 kg)  01/05/23 125 lb (56.7 kg)   BP Readings from Last 3 Encounters:  12/20/23 (!) 168/84  06/20/23 (!) 142/92  12/16/22 136/88   Immunization History  Administered Date(s) Administered   DTaP 09/20/2008   Fluad Quad(high Dose 65+) 02/18/2020   Influenza Split 02/04/2014   Influenza,inj,Quad PF,6+ Mos 02/04/2014   Influenza,inj,Quad PF,6-35 Mos 03/06/2016   Influenza-Unspecified 02/04/2014, 03/06/2016   PFIZER Comirnaty(Gray Top)Covid-19 Tri-Sucrose Vaccine 12/17/2020   PFIZER(Purple Top)SARS-COV-2 Vaccination 06/12/2019, 07/03/2019, 02/21/2020   PNEUMOCOCCAL CONJUGATE-20 12/20/2023   Pfizer(Comirnaty)Fall Seasonal Vaccine 12 years and older 03/20/2022   Pneumococcal Conjugate-13 11/21/2017   Pneumococcal Polysaccharide-23 11/27/2018   Tdap 11/27/2018   Zoster Recombinant(Shingrix) 02/09/2018, 03/23/2018, 02/01/2019   Zoster, Live 10/19/2012   Health Maintenance Due  Topic Date Due   Colonoscopy  12/24/2023   Medicare Annual Wellness (AWV)  01/13/2024      Past Medical History:  Diagnosis Date   Atrophic kidney 11/18/2015   Episodic low back pain    GERD (gastroesophageal reflux disease)    Heart murmur    Migraines    Osteopenia 10/15/2010    Osteoporosis, unspecified 10/19/2012   Positive TB test    Positive TB skin test   Past Surgical History:  Procedure Laterality Date   ABDOMINAL HYSTERECTOMY  1996   TUBAL LIGATION      reports that she has quit smoking. She has never used smokeless tobacco. She reports current alcohol use. She reports that she does not use drugs. family history includes Diabetes in an other family member; Heart disease in her father and mother. Allergies  Allergen Reactions   Other Other (See Comments)   Brimonidine Tartrate Other (See Comments)   Gabapentin  Other (See Comments)    sedation   Mobic  [Meloxicam ] Nausea And Vomiting   Yellow Jacket Venom [Bee Venom]    Current Outpatient Medications on File Prior to Visit  Medication Sig Dispense Refill   calcium  carbonate (OS-CAL) 600 MG TABS Take 600 mg by mouth daily.       EPINEPHrine  0.3 mg/0.3 mL IJ SOAJ injection Inject 0.3 mg into the muscle as needed for anaphylaxis. 2 each 1   Multiple Vitamin (MULTIVITAMIN) capsule Take 1 capsule by mouth daily.       Omega-3 Fatty Acids (FISH OIL) 1000 MG CAPS Take by mouth.     rosuvastatin  (CRESTOR ) 5 MG tablet Take 1 tablet (5 mg total) by mouth daily. 90 tablet 3   Current Facility-Administered Medications on File Prior to Visit  Medication  Dose Route Frequency Provider Last Rate Last Admin   denosumab  (PROLIA ) injection 60 mg  60 mg Subcutaneous Once Norleen Lynwood ORN, MD            ROS:  All others reviewed and negative.  Objective        PE:  BP (!) 168/84   Pulse (!) 59   Temp 97.8 F (36.6 C)   Ht 5' 2.5 (1.588 m)   Wt 131 lb (59.4 kg)   SpO2 98%   BMI 23.58 kg/m                 Constitutional: Pt appears in NAD               HENT: Head: NCAT.                Right Ear: External ear normal.                 Left Ear: External ear normal.                Eyes: . Pupils are equal, round, and reactive to light. Conjunctivae and EOM are normal               Nose: without d/c or  deformity               Neck: Neck supple. Gross normal ROM               Cardiovascular: Normal rate and regular rhythm.                 Pulmonary/Chest: Effort normal and breath sounds without rales or wheezing.                Abd:  Soft, NT, ND, + BS, no organomegaly; left knee with full ROM but large crepitus and bony enlargement degenerative changes               Neurological: Pt is alert. At baseline orientation, motor grossly intact               Skin: Skin is warm. No rashes, no other new lesions, LE edema - none               Psychiatric: Pt behavior is normal without agitation   Micro: none  Cardiac tracings I have personally interpreted today:  none  Pertinent Radiological findings (summarize): none   Lab Results  Component Value Date   WBC 6.2 12/15/2023   HGB 13.4 12/15/2023   HCT 40.0 12/15/2023   PLT 179.0 12/15/2023   GLUCOSE 99 12/15/2023   CHOL 158 12/15/2023   TRIG 187.0 (H) 12/15/2023   HDL 52.40 12/15/2023   LDLDIRECT 103.0 11/27/2020   LDLCALC 68 12/15/2023   ALT 21 12/15/2023   AST 22 12/15/2023   NA 142 12/15/2023   K 4.0 12/15/2023   CL 105 12/15/2023   CREATININE 0.74 12/15/2023   BUN 20 12/15/2023   CO2 30 12/15/2023   TSH 1.14 12/15/2023   HGBA1C 5.8 12/15/2023   MICROALBUR 0.8 12/15/2023   Assessment/Plan:  Beverly Shaw is a 73 y.o. White or Caucasian [1] female with  has a past medical history of Atrophic kidney (11/18/2015), Episodic low back pain, GERD (gastroesophageal reflux disease), Heart murmur, Migraines, Osteopenia (10/15/2010), Osteoporosis, unspecified (10/19/2012), and Positive TB test.  Encounter for well adult exam with abnormal findings Age and sex appropriate education and counseling updated with regular exercise and diet Referrals  for preventative services - for colonoscopy Immunizations addressed - for Prevnar 20 today Smoking counseling  - none needed Evidence for depression or other mood disorder - none significant Most  recent labs reviewed. I have personally reviewed and have noted: 1) the patient's medical and social history 2) The patient's current medications and supplements 3) The patient's height, weight, and BMI have been recorded in the chart   Hyperlipidemia Lab Results  Component Value Date   LDLCALC 68 12/15/2023   Stable, pt to continue current statin crestor  5 mg qd   Hyperglycemia Lab Results  Component Value Date   HGBA1C 5.8 12/15/2023   Stable, pt to continue current medical treatment  - diet, wt control   Osteoporosis Pt to continue prolia  asd, for f/u DXA next yr  Arthritis of left knee Pt with recent onset worsening pain - for left knee xray, and refer sport medicine, may need cortisone  Followup: Return in about 1 year (around 12/19/2024).  Lynwood Rush, MD 12/20/2023 12:37 PM [facility]

## 2023-12-21 ENCOUNTER — Other Ambulatory Visit: Payer: Self-pay | Admitting: Internal Medicine

## 2023-12-21 NOTE — Progress Notes (Unsigned)
    Ben Jackson D.[facility] [address] [place] [address] Phone: [phone]   Assessment and Plan:     There are no diagnoses linked to this encounter.  ***   Pertinent previous records reviewed include ***    Follow Up: ***     Subjective:   I, Beverly Shaw, am serving as a Neurosurgeon for Doctor Morene Mace  Chief Complaint: left knee pain   HPI:   12/22/2023 Patient is a 73 year old female with left knee pain. Patient states   Relevant Historical Information: ***  Additional pertinent review of systems negative.   Current Outpatient Medications:    calcium  carbonate (OS-CAL) 600 MG TABS, Take 600 mg by mouth daily.  , Disp: , Rfl:    EPINEPHrine  0.3 mg/0.3 mL IJ SOAJ injection, Inject 0.3 mg into the muscle as needed for anaphylaxis., Disp: 2 each, Rfl: 1   Multiple Vitamin (MULTIVITAMIN) capsule, Take 1 capsule by mouth daily.  , Disp: , Rfl:    Omega-3 Fatty Acids (FISH OIL) 1000 MG CAPS, Take by mouth., Disp: , Rfl:    rosuvastatin  (CRESTOR ) 5 MG tablet, Take 1 tablet (5 mg total) by mouth daily., Disp: 90 tablet, Rfl: 3  Current Facility-Administered Medications:    denosumab  (PROLIA ) injection 60 mg, 60 mg, Subcutaneous, Once, Norleen Lynwood ORN, MD   Objective:     There were no vitals filed for this visit.    There is no height or weight on file to calculate BMI.    Physical Exam:    ***   Electronically signed by:  Odis Mace D[facility] 7:45 AM 12/21/23

## 2023-12-22 ENCOUNTER — Ambulatory Visit: Admitting: Sports Medicine

## 2023-12-22 VITALS — BP 142/70 | HR 66 | Ht 62.5 in | Wt 131.8 lb

## 2023-12-22 DIAGNOSIS — M1712 Unilateral primary osteoarthritis, left knee: Secondary | ICD-10-CM | POA: Diagnosis not present

## 2023-12-22 DIAGNOSIS — M25562 Pain in left knee: Secondary | ICD-10-CM | POA: Diagnosis not present

## 2023-12-22 DIAGNOSIS — G8929 Other chronic pain: Secondary | ICD-10-CM | POA: Diagnosis not present

## 2023-12-22 MED ORDER — CELECOXIB 200 MG PO CAPS: 200.0000 mg | ORAL_CAPSULE | Freq: Two times a day (BID) | ORAL | 0 refills | Status: DC

## 2023-12-22 NOTE — Patient Instructions (Addendum)
 Knee Hep.  - Start celebrex 200 mg twice daily x2 weeks.  If still having pain after 2 weeks, complete 3rd-week of NSAID. May use remaining NSAID as needed once daily for pain control.  Do not to use additional over-the-counter NSAIDs (ibuprofen, naproxen , Advil, Aleve , etc.) while taking prescription NSAIDs.  May use Tylenol [id number] mg 2 to 3 times a day for breakthrough pain.  Follow up in 4 weeks.

## 2023-12-25 ENCOUNTER — Ambulatory Visit: Payer: Self-pay | Admitting: Internal Medicine

## 2023-12-29 DIAGNOSIS — M51361 Other intervertebral disc degeneration, lumbar region with lower extremity pain only: Secondary | ICD-10-CM | POA: Diagnosis not present

## 2023-12-29 DIAGNOSIS — M9903 Segmental and somatic dysfunction of lumbar region: Secondary | ICD-10-CM | POA: Diagnosis not present

## 2023-12-29 DIAGNOSIS — M5442 Lumbago with sciatica, left side: Secondary | ICD-10-CM | POA: Diagnosis not present

## 2023-12-29 DIAGNOSIS — M9904 Segmental and somatic dysfunction of sacral region: Secondary | ICD-10-CM | POA: Diagnosis not present

## 2023-12-29 DIAGNOSIS — M51371 Other intervertebral disc degeneration, lumbosacral region with lower extremity pain only: Secondary | ICD-10-CM | POA: Diagnosis not present

## 2024-01-16 ENCOUNTER — Telehealth: Payer: Self-pay

## 2024-01-16 NOTE — Telephone Encounter (Signed)
 Copied from CRM #[id number]. Topic: Clinical - Request for Lab/Test Order >> Jan 16, 2024  9:44 AM Rea BROCKS wrote: Reason for CRM: Patient is calling because she had a physical last month with Dr. Norleen. And, they discussed a colonoscopy. Patient has not received or heard anything back in regards to scheduling for one.   [phone] (M)

## 2024-01-18 NOTE — Progress Notes (Unsigned)
 Ben Jackson D.[facility] [address] [place] [address] Phone: [phone]   Assessment and Plan:     1. Primary osteoarthritis of left knee (Primary) 2. Chronic pain of left knee -Chronic with exacerbation, subsequent visit - Still most consistent with flare of osteoarthritis of left knee leading to compensatory muscular pain into the lower left leg.  Patient had quadriceps calcific density on x-ray and degenerative changes - No improvement with Celebrex , so discontinue Celebrex  - Use Tylenol 500 to 1000 mg tablets 2-3 times a day for day-to-day pain relief - Continue HEP for knee and start physical therapy - Patient elected for intra-articular knee CSI.  Tolerated well per note below.  Procedure: Knee Joint Injection Side: Left Indication: Flare of osteoarthritis  Risks explained and consent was given verbally. The site was cleaned with alcohol prep. A needle was introduced with an anterio-lateral approach. Injection given using 2mL of 1% lidocaine without epinephrine  and 1mL of kenalog  40mg /ml. This was well tolerated.  Needle was removed, hemostasis achieved, and post injection instructions were explained.   Pt was advised to call or return to clinic if these symptoms worsen or fail to improve as anticipated.     Pertinent previous records reviewed include none   Follow Up: 6 weeks for reevaluation.  If no improvement or worsening of symptoms, could consider alternative injection versus advanced imaging versus treating quadriceps calcific tendinitis   Subjective:   I, Chantz Montefusco, am serving as a Neurosurgeon for Doctor Morene Mace  Chief Complaint: left knee pain    HPI:    12/22/2023 Patient is a 73 year old female with left knee pain. Patient states that it is hurting. Sometimes not sure if it is just the knee or if it is the whole leg. It has been hurting since about a year ago when she was walking. Uses voltaren  and a wrap  over the knee. Now it is keeping her up at night and waking her up.    01/19/2024 Patient states she is the same   Relevant Historical Information: GERD  Additional pertinent review of systems negative.   Current Outpatient Medications:    calcium  carbonate (OS-CAL) 600 MG TABS, Take 600 mg by mouth daily.  , Disp: , Rfl:    celecoxib  (CELEBREX ) 200 MG capsule, Take 1 capsule (200 mg total) by mouth 2 (two) times daily., Disp: 60 capsule, Rfl: 0   EPINEPHrine  0.3 mg/0.3 mL IJ SOAJ injection, Inject 0.3 mg into the muscle as needed for anaphylaxis., Disp: 2 each, Rfl: 1   Multiple Vitamin (MULTIVITAMIN) capsule, Take 1 capsule by mouth daily.  , Disp: , Rfl:    Omega-3 Fatty Acids (FISH OIL) 1000 MG CAPS, Take by mouth., Disp: , Rfl:    rosuvastatin  (CRESTOR ) 5 MG tablet, Take 1 tablet (5 mg total) by mouth daily., Disp: 90 tablet, Rfl: 3  Current Facility-Administered Medications:    denosumab  (PROLIA ) injection 60 mg, 60 mg, Subcutaneous, Once, Norleen Lynwood ORN, MD   Objective:     Vitals:   01/19/24 0847  Pulse: 77  SpO2: 99%  Weight: 131 lb (59.4 kg)  Height: 5' 2 (1.575 m)      Body mass index is 23.96 kg/m.    Physical Exam:    General:  awake, alert oriented, no acute distress nontoxic Skin: no suspicious lesions or rashes Neuro:sensation intact and strength 5/5 with no deficits, no atrophy, normal muscle tone Psych: No signs of anxiety, depression  or other mood disorder   Left knee: No swelling No deformity Neg fluid wave, joint milking ROM Flex 110, Ext 0 TTP lateral femoral condyle, patella, patellar tendon NTTP over the quad tendon, medial fem condyle,    tibial tuberostiy, fibular head, posterior fossa, pes anserine bursa, gerdy's tubercle, medial jt line, lateral jt line Neg anterior and posterior drawer Neg lachman Neg sag sign Negative varus stress Negative valgus stress Negative McMurray for palpable pop, no reproduced pain Positive Thessaly   Gait  normal    Electronically signed by:  Odis Mace D[facility] 9:13 AM 01/19/24

## 2024-01-19 ENCOUNTER — Ambulatory Visit: Admitting: Sports Medicine

## 2024-01-19 VITALS — HR 77 | Ht 62.0 in | Wt 131.0 lb

## 2024-01-19 DIAGNOSIS — M25562 Pain in left knee: Secondary | ICD-10-CM

## 2024-01-19 DIAGNOSIS — M1712 Unilateral primary osteoarthritis, left knee: Secondary | ICD-10-CM

## 2024-01-19 DIAGNOSIS — G8929 Other chronic pain: Secondary | ICD-10-CM

## 2024-01-19 NOTE — Patient Instructions (Signed)
 PT referral  6 week follow up

## 2024-01-19 NOTE — Telephone Encounter (Signed)
 Called and spoke with patient. Informed her that referral had been placed and authorized but that I was unsure of why they hadn't been able to reach out to schedule her. Did provide patient with contact info for [facility]

## 2024-01-23 ENCOUNTER — Other Ambulatory Visit: Payer: Self-pay | Admitting: Internal Medicine

## 2024-01-23 ENCOUNTER — Encounter: Payer: Self-pay | Admitting: Internal Medicine

## 2024-01-23 DIAGNOSIS — Z1231 Encounter for screening mammogram for malignant neoplasm of breast: Secondary | ICD-10-CM

## 2024-01-24 DIAGNOSIS — D2271 Melanocytic nevi of right lower limb, including hip: Secondary | ICD-10-CM | POA: Diagnosis not present

## 2024-01-24 DIAGNOSIS — D225 Melanocytic nevi of trunk: Secondary | ICD-10-CM | POA: Diagnosis not present

## 2024-01-24 DIAGNOSIS — L821 Other seborrheic keratosis: Secondary | ICD-10-CM | POA: Diagnosis not present

## 2024-01-24 DIAGNOSIS — L7211 Pilar cyst: Secondary | ICD-10-CM | POA: Diagnosis not present

## 2024-01-24 DIAGNOSIS — L814 Other melanin hyperpigmentation: Secondary | ICD-10-CM | POA: Diagnosis not present

## 2024-01-24 DIAGNOSIS — L578 Other skin changes due to chronic exposure to nonionizing radiation: Secondary | ICD-10-CM | POA: Diagnosis not present

## 2024-01-26 DIAGNOSIS — M5442 Lumbago with sciatica, left side: Secondary | ICD-10-CM | POA: Diagnosis not present

## 2024-01-26 DIAGNOSIS — M51371 Other intervertebral disc degeneration, lumbosacral region with lower extremity pain only: Secondary | ICD-10-CM | POA: Diagnosis not present

## 2024-01-26 DIAGNOSIS — M51361 Other intervertebral disc degeneration, lumbar region with lower extremity pain only: Secondary | ICD-10-CM | POA: Diagnosis not present

## 2024-01-26 DIAGNOSIS — M9903 Segmental and somatic dysfunction of lumbar region: Secondary | ICD-10-CM | POA: Diagnosis not present

## 2024-01-26 DIAGNOSIS — M9904 Segmental and somatic dysfunction of sacral region: Secondary | ICD-10-CM | POA: Diagnosis not present

## 2024-02-05 ENCOUNTER — Other Ambulatory Visit: Payer: Self-pay | Admitting: Internal Medicine

## 2024-02-13 DIAGNOSIS — D485 Neoplasm of uncertain behavior of skin: Secondary | ICD-10-CM | POA: Diagnosis not present

## 2024-02-13 DIAGNOSIS — L7211 Pilar cyst: Secondary | ICD-10-CM | POA: Diagnosis not present

## 2024-02-16 ENCOUNTER — Ambulatory Visit

## 2024-02-16 ENCOUNTER — Ambulatory Visit
Admission: RE | Admit: 2024-02-16 | Discharge: 2024-02-16 | Disposition: A | Discharge status: Home / Self Care | Source: Ambulatory Visit | Attending: Internal Medicine | Admitting: Internal Medicine

## 2024-02-16 VITALS — BP 136/76 | HR 94 | Resp 16 | Ht 62.0 in | Wt 127.0 lb

## 2024-02-16 DIAGNOSIS — Z1231 Encounter for screening mammogram for malignant neoplasm of breast: Secondary | ICD-10-CM | POA: Diagnosis not present

## 2024-02-16 DIAGNOSIS — Z0000 Encounter for general adult medical examination without abnormal findings: Secondary | ICD-10-CM

## 2024-02-16 NOTE — Progress Notes (Signed)
 Subjective:  Please attest and cosign this visit due to patients primary care provider not being in the office at the time the visit was completed.  (Pt of Dr Lynwood Rush)   Beverly Shaw is a 73 y.o. who presents for a Medicare Wellness preventive visit.  As a reminder, Annual Wellness Visits don't include a physical exam, and some assessments may be limited, especially if this visit is performed virtually. We may recommend an in-person follow-up visit with your provider if needed.  Visit Complete: Virtual I connected with  Beverly Shaw on 02/16/24 by a audio enabled telemedicine application and verified that I am speaking with the correct person using two identifiers.  Patient Location: Home  Provider Location: Office/Clinic  I discussed the limitations of evaluation and management by telemedicine. The patient expressed understanding and agreed to proceed.  Vital Signs: Because this visit was a virtual/telehealth visit, some criteria may be missing or patient reported. Any vitals not documented were not able to be obtained and vitals that have been documented are patient reported.  VideoDeclined- This patient declined Librarian, academic. Therefore the visit was completed with audio only.  Persons Participating in Visit: Patient.  AWV Questionnaire: Yes: Patient Medicare AWV questionnaire was completed by the patient on 02/15/2024; I have confirmed that all information answered by patient is correct and no changes since this date.  Cardiac Risk Factors include: advanced age (>24men, >43 women);dyslipidemia     Objective:    Today's Vitals   02/16/24 1234  Weight: 127 lb (57.6 kg)  Height: 5' 2 (1.575 m)   Body mass index is 23.23 kg/m.     02/16/2024   12:34 PM 01/05/2023    1:15 PM 02/15/2019    9:50 AM 06/01/2018    9:21 AM 12/06/2013    3:30 PM  Advanced Directives  Does Patient Have a Medical Advance Directive? No Yes No No  Patient does not  have advance directive   Type of Customer service manager Power of [place];Living will     Copy of Healthcare Power of Attorney in Chart?  No - copy requested     Would patient like information on creating a medical advance directive? No - Patient declined  No - Patient declined No - Patient declined    Pre-existing out of facility DNR order (yellow form or pink MOST form)     No      Data saved with a previous flowsheet row definition    Current Medications (verified) Outpatient Encounter Medications as of 02/16/2024  Medication Sig   calcium  carbonate (OS-CAL) 600 MG TABS Take 600 mg by mouth daily.     EPINEPHrine  0.3 mg/0.3 mL IJ SOAJ injection Inject 0.3 mg into the muscle as needed for anaphylaxis.   Multiple Vitamin (MULTIVITAMIN) capsule Take 1 capsule by mouth daily.     Omega-3 Fatty Acids (FISH OIL) 1000 MG CAPS Take by mouth.   rosuvastatin  (CRESTOR ) 5 MG tablet TAKE 1 TABLET (5 MG TOTAL) BY MOUTH DAILY.   celecoxib  (CELEBREX ) 200 MG capsule Take 1 capsule (200 mg total) by mouth 2 (two) times daily. (Patient not taking: Reported on 02/16/2024)   Facility-Administered Encounter Medications as of 02/16/2024  Medication   denosumab  (PROLIA ) injection 60 mg    Allergies (verified) Other, Brimonidine tartrate, Gabapentin , Mobic  [meloxicam ], and Yellow jacket venom [bee venom]   History: Past Medical History:  Diagnosis Date   Atrophic kidney 11/18/2015   Episodic low back pain  GERD (gastroesophageal reflux disease)    Heart murmur    Migraines    Osteopenia 10/15/2010   Osteoporosis, unspecified 10/19/2012   Positive TB test    Positive TB skin test   Past Surgical History:  Procedure Laterality Date   ABDOMINAL HYSTERECTOMY  1996   TUBAL LIGATION     Family History  Problem Relation Age of Onset   Heart disease Mother    Heart disease Father    Diabetes Other    Colon cancer Neg Hx    Pancreatic cancer Neg Hx    Rectal cancer Neg Hx    Stomach cancer  Neg Hx    Osteoporosis Neg Hx    Social History   Socioeconomic History   Marital status: Married    Spouse name: Douglass   Number of children: 3   Years of education: Not on file   Highest education level: GED or equivalent  Occupational History   Occupation: ED Engineer, production Long  Tobacco Use   Smoking status: Former   Smokeless tobacco: Never  Advertising account planner   Vaping status: Never Used  Substance and Sexual Activity   Alcohol use: Yes    Comment: once monthly   Drug use: No   Sexual activity: Yes  Other Topics Concern   Not on file  Social History Narrative   Works part-time at [employer] long [employer]   Social Drivers of Corporate investment banker Strain: Low Risk  (02/16/2024)   Overall Financial Resource Strain (CARDIA)    Difficulty of Paying Living Expenses: Not hard at all  Food Insecurity: No Food Insecurity (02/16/2024)   Hunger Vital Sign    Worried About Running Out of Food in the Last Year: Never true    Ran Out of Food in the Last Year: Never true  Transportation Needs: No Transportation Needs (02/16/2024)   PRAPARE - Administrator, Civil Service (Medical): No    Lack of Transportation (Non-Medical): No  Physical Activity: Inactive (02/16/2024)   Exercise Vital Sign    Days of Exercise per Week: 0 days    Minutes of Exercise per Session: 0 min  Stress: No Stress Concern Present (02/16/2024)   [employer] of Occupational Health - Occupational Stress Questionnaire    Feeling of Stress: Not at all  Social Connections: Moderately Isolated (02/16/2024)   Social Connection and Isolation Panel    Frequency of Communication with Friends and Family: Twice a week    Frequency of Social Gatherings with Friends and Family: Twice a week    Attends Religious Services: Never    Database administrator or Organizations: No    Attends Engineer, structural: Never    Marital Status: Married    Tobacco Counseling Counseling given: Not  Answered    Clinical Intake:  Pre-visit preparation completed: Yes  Pain : No/denies pain     BMI - recorded: 23.23 Nutritional Status: BMI of 19-24  Normal Nutritional Risks: None Diabetes: No  Lab Results  Component Value Date   HGBA1C 5.8 12/15/2023   HGBA1C 5.7 12/09/2022   HGBA1C 5.7 12/03/2021     How often do you need to have someone help you when you read instructions, pamphlets, or other written materials from your doctor or pharmacy?: 1 - Never  Interpreter Needed?: No  Information entered by :: Beverly Shaw, CMA   Activities of Daily Living     02/16/2024   12:37 PM 02/15/2024    5:38  PM  In your present state of health, do you have any difficulty performing the following activities:  Hearing? 0 0  Vision? 0 0  Difficulty concentrating or making decisions? 0 0  Walking or climbing stairs? 0 0  Dressing or bathing? 0 0  Doing errands, shopping? 0 0  Preparing Food and eating ? N N  Using the Toilet? N N  In the past six months, have you accidently leaked urine? N N  Do you have problems with loss of bowel control? N N  Managing your Medications? N N  Managing your Finances? N N  Housekeeping or managing your Housekeeping? N N    Patient Care Team: Norleen Lynwood ORN, MD as PCP - General (Internal Medicine) Vannie Elsie HERO, OD (Ophthalmology)  I have updated your Care Teams any recent Medical Services you may have received from other providers in the past year.     Assessment:   This is a routine wellness examination for Beverly Shaw.  Hearing/Vision screen Hearing Screening - Comments:: Denies hearing difficulties   Vision Screening - Comments:: Wears rx glasses - up to date with routine eye exams with Dr Gershon   Goals Addressed               This Visit's Progress     Patient Stated (pt-stated)        Patient stated she plans to continue stay on top of health       Depression Screen     02/16/2024   12:38 PM 12/20/2023    8:23 AM  01/05/2023    1:21 PM 12/16/2022    8:36 AM 07/01/2022    9:48 AM 12/10/2021    8:18 AM 12/10/2021    8:04 AM  PHQ 2/9 Scores  PHQ - 2 Score 0 0 0 0 0 0 0  PHQ- 9 Score 0 1 2  0  0    Fall Risk     02/16/2024   12:38 PM 02/15/2024    5:38 PM 12/20/2023    8:23 AM 01/05/2023    1:15 PM 12/16/2022    8:36 AM  Fall Risk   Falls in the past year? 0 0 0 1 1  Number falls in past yr: 0 0 0 0 0  Injury with Fall? 0 0 0 1 1  Comment    went to Urgent Care   Risk for fall due to : No Fall Risks  No Fall Risks  History of fall(s)  Follow up Falls evaluation completed;Falls prevention discussed  Falls evaluation completed Falls evaluation completed;Falls prevention discussed Falls evaluation completed    MEDICARE RISK AT HOME:  Medicare Risk at Home Any stairs in or around the home?: Yes If so, are there any without handrails?: No Home free of loose throw rugs in walkways, pet beds, electrical cords, etc?: Yes Adequate lighting in your home to reduce risk of falls?: Yes Life alert?: No Use of a cane, walker or w/c?: No Grab bars in the bathroom?: No Shower chair or bench in shower?: No Elevated toilet seat or a handicapped toilet?: No  TIMED UP AND GO:  Was the test performed?  No  Cognitive Function: 6CIT completed        02/16/2024   12:40 PM 01/05/2023    1:17 PM  6CIT Screen  What Year? 0 points 0 points  What month? 0 points 0 points  What time? 0 points 0 points  Count back from 20 0 points 0 points  Months in reverse 0 points 0 points  Repeat phrase 0 points 10 points  Total Score 0 points 10 points    Immunizations Immunization History  Administered Date(s) Administered   DTaP 09/20/2008   Fluad Quad(high Dose 65+) 02/18/2020   Influenza Split 02/04/2014   Influenza,inj,Quad PF,6+ Mos 02/04/2014   Influenza,inj,Quad PF,6-35 Mos 03/06/2016   Influenza-Unspecified 02/04/2014, 03/06/2016   PFIZER Comirnaty(Gray Top)Covid-19 Tri-Sucrose Vaccine 12/17/2020    PFIZER(Purple Top)SARS-COV-2 Vaccination 06/12/2019, 07/03/2019, 02/21/2020   PNEUMOCOCCAL CONJUGATE-20 12/20/2023   Pfizer(Comirnaty)Fall Seasonal Vaccine 12 years and older 03/20/2022   Pneumococcal Conjugate-13 11/21/2017   Pneumococcal Polysaccharide-23 11/27/2018   Tdap 11/27/2018   Zoster Recombinant(Shingrix) 02/09/2018, 03/23/2018, 02/01/2019   Zoster, Live 10/19/2012    Screening Tests Health Maintenance  Topic Date Due   Influenza Vaccine  12/22/2023   Colonoscopy  12/24/2023   Mammogram  02/14/2025   Medicare Annual Wellness (AWV)  02/15/2025   DTaP/Tdap/Td (3 - Td or Tdap) 11/26/2028   Pneumococcal Vaccine: 50+ Years  Completed   DEXA SCAN  Completed   Hepatitis C Screening  Completed   Zoster Vaccines- Shingrix  Completed   HPV VACCINES  Aged Out   Meningococcal B Vaccine  Aged Out   COVID-19 Vaccine  Discontinued    Health Maintenance Items Addressed:  Colonoscopy status: scheduled for 01/2024.  Mammogram status: Performed on 02/16/2024.  Additional Screening:  Vision Screening: Recommended annual ophthalmology exams for early detection of glaucoma and other disorders of the eye. Is the patient up to date with their annual eye exam?  Yes  Who is the provider or what is the name of the office in which the patient attends annual eye exams? Dr Gershon  Dental Screening: Recommended annual dental exams for proper oral hygiene  Community Resource Referral / Chronic Care Management: CRR required this visit?  No   CCM required this visit?  No   Plan:    I have personally reviewed and noted the following in the patient's chart:   Medical and social history Use of alcohol, tobacco or illicit drugs  Current medications and supplements including opioid prescriptions. Patient is not currently taking opioid prescriptions. Functional ability and status Nutritional status Physical activity Advanced directives List of other physicians Hospitalizations,  surgeries, and ER visits in previous 12 months Vitals Screenings to include cognitive, depression, and falls Referrals and appointments  In addition, I have reviewed and discussed with patient certain preventive protocols, quality metrics, and best practice recommendations. A written personalized care plan for preventive services as well as general preventive health recommendations were provided to patient.   Beverly Shaw, CMA   02/16/2024   After Visit Summary: (MyChart) Due to this being a telephonic visit, the after visit summary with patients personalized plan was offered to patient via MyChart   Notes: Nothing significant to report at this time.

## 2024-02-16 NOTE — Patient Instructions (Addendum)
 Ms. Beverly Shaw,  Thank you for taking the time for your Medicare Wellness Visit. I appreciate your continued commitment to your health goals. Please review the care plan we discussed, and feel free to reach out if I can assist you further.  Medicare recommends these wellness visits once per year to help you and your care team stay ahead of potential health issues. These visits are designed to focus on prevention, allowing your provider to concentrate on managing your acute and chronic conditions during your regular appointments.  Please note that Annual Wellness Visits do not include a physical exam. Some assessments may be limited, especially if the visit was conducted virtually. If needed, we may recommend a separate in-person follow-up with your provider.  Ongoing Care Seeing your primary care provider every 3 to 6 months helps us  monitor your health and provide consistent, personalized care.   Referrals If a referral was made during today's visit and you haven't received any updates within two weeks, please contact the referred provider directly to check on the status.  Recommended Screenings:  Health Maintenance  Topic Date Due   Flu Shot  12/22/2023   Colon Cancer Screening  12/24/2023   Breast Cancer Screening  02/14/2025   Medicare Annual Wellness Visit  02/15/2025   DTaP/Tdap/Td vaccine (3 - Td or Tdap) 11/26/2028   Pneumococcal Vaccine for age over 67  Completed   DEXA scan (bone density measurement)  Completed   Hepatitis C Screening  Completed   Zoster (Shingles) Vaccine  Completed   HPV Vaccine  Aged Out   Meningitis B Vaccine  Aged Out   COVID-19 Vaccine  Discontinued       02/16/2024   12:34 PM  Advanced Directives  Does Patient Have a Medical Advance Directive? No  Would patient like information on creating a medical advance directive? No - Patient declined   Advance Care Planning is important because it: Ensures you receive medical care that aligns with your  values, goals, and preferences. Provides guidance to your family and loved ones, reducing the emotional burden of decision-making during critical moments.  Vision: Annual vision screenings are recommended for early detection of glaucoma, cataracts, and diabetic retinopathy. These exams can also reveal signs of chronic conditions such as diabetes and high blood pressure.  Dental: Annual dental screenings help detect early signs of oral cancer, gum disease, and other conditions linked to overall health, including heart disease and diabetes.

## 2024-02-18 DIAGNOSIS — Z1211 Encounter for screening for malignant neoplasm of colon: Secondary | ICD-10-CM | POA: Diagnosis not present

## 2024-02-18 DIAGNOSIS — Z1212 Encounter for screening for malignant neoplasm of rectum: Secondary | ICD-10-CM | POA: Diagnosis not present

## 2024-02-20 ENCOUNTER — Telehealth: Payer: Self-pay

## 2024-02-20 ENCOUNTER — Other Ambulatory Visit (HOSPITAL_COMMUNITY): Payer: Self-pay

## 2024-02-20 NOTE — Telephone Encounter (Signed)
 Pt ready for scheduling for PROLIA  on or after : 03/21/24  Option# 1: Buy/Bill (Office supplied medication)  Out-of-pocket cost due at time of clinic visit: $357  Number of injection/visits approved: 2  Primary: AET[employer]-MEDICARE Prolia  co-insurance: 20% Admin fee co-insurance: 20%  Secondary: --- Prolia  co-insurance:  Admin fee co-insurance:   Medical Benefit Details: Date Benefits were checked: 02/20/24 Deductible: NO/ Coinsurance: 20%/ Admin Fee: 20%  Prior Auth: APPROVED PA# [id number] Expiration Date: 09/06/23-09/05/24   # of doses approved: 2 ----------------------------------------------------------------------- Option# 2- Med Obtained from pharmacy:  Pharmacy benefit: Copay $645.79 (Paid to pharmacy) Admin Fee: 20% (Pay at clinic)  Prior Auth: N/A PA# Expiration Date:   # of doses approved:   If patient wants fill through the pharmacy benefit please send prescription to: [facility], and include estimated need by date in rx notes. Pharmacy will ship medication directly to the office.  Patient NOT eligible for Prolia  Copay Card. Copay Card can make patient's cost as little as $25. Link to apply: https://www.amgensupportplus.com/copay  ** This summary of benefits is an estimation of the patient's out-of-pocket cost. Exact cost may very based on individual plan coverage.

## 2024-02-20 NOTE — Telephone Encounter (Signed)
 Prolia  VOB initiated via [email or web address]  Next Prolia  inj DUE: 03/21/24

## 2024-02-20 NOTE — Telephone Encounter (Signed)
 Beverly Shaw

## 2024-02-21 DIAGNOSIS — M5442 Lumbago with sciatica, left side: Secondary | ICD-10-CM | POA: Diagnosis not present

## 2024-02-21 DIAGNOSIS — M51371 Other intervertebral disc degeneration, lumbosacral region with lower extremity pain only: Secondary | ICD-10-CM | POA: Diagnosis not present

## 2024-02-21 DIAGNOSIS — M51361 Other intervertebral disc degeneration, lumbar region with lower extremity pain only: Secondary | ICD-10-CM | POA: Diagnosis not present

## 2024-02-21 DIAGNOSIS — M9904 Segmental and somatic dysfunction of sacral region: Secondary | ICD-10-CM | POA: Diagnosis not present

## 2024-02-21 DIAGNOSIS — M9903 Segmental and somatic dysfunction of lumbar region: Secondary | ICD-10-CM | POA: Diagnosis not present

## 2024-02-23 ENCOUNTER — Ambulatory Visit

## 2024-02-23 ENCOUNTER — Encounter: Payer: Self-pay | Admitting: Internal Medicine

## 2024-02-23 VITALS — Ht 62.0 in | Wt 130.4 lb

## 2024-02-23 DIAGNOSIS — Z1211 Encounter for screening for malignant neoplasm of colon: Secondary | ICD-10-CM

## 2024-02-23 LAB — COLOGUARD: COLOGUARD: NEGATIVE

## 2024-02-23 MED ORDER — NA SULFATE-K SULFATE-MG SULF 17.5-3.13-1.6 GM/177ML PO SOLN: 1.0000 | Freq: Once | ORAL | 0 refills | Status: AC

## 2024-02-23 NOTE — Progress Notes (Signed)
 No egg or soy allergy known to patient  No issues known to pt with past sedation with any surgeries or procedures Patient denies ever being told they had issues or difficulty with intubation  No FH of Malignant Hyperthermia Pt is not on diet pills Pt is not on  home 02  Pt is not on blood thinners  Constipation: yes  No A fib or A flutter Have any cardiac testing pending-- no  LOA: independent Prep: suprep   Patient's chart reviewed by Norleen Schillings CNRA prior to previsit and patient appropriate for the [facility].  Previsit completed and red dot placed by patient's name on their procedure day (on provider's schedule).     PV completed with patient. Prep instructions sent via mychart and hard copy given at [facility] apt

## 2024-02-26 ENCOUNTER — Telehealth: Payer: Self-pay | Admitting: Internal Medicine

## 2024-02-26 NOTE — Telephone Encounter (Signed)
 I am unsure why this message was sent to [facility] - please review Thank you Bre, PV RN

## 2024-02-26 NOTE — Telephone Encounter (Signed)
 Inbound call from patient states her cologurd  test came back negative. Requesting to speak with a nurse. Please advise.

## 2024-02-26 NOTE — Telephone Encounter (Signed)
 Dr Abran, patient is currently scheduled for recall colonoscopy with you on Friday, 03/08/24. Last colonoscopy showed only polypoid tissue at transverse colon on 01/02/14 with recommended 10 year recall. No documented family history of colon cancer, no previous personal history of colon polyps.  Patient completed cologuard testing ordered by another provider on 02/18/24 with negative results.  Does she still need to proceed with recall colonoscopy as currently scheduled 03/08/24 or should she hold off following recent negative cologuard?

## 2024-02-26 NOTE — Telephone Encounter (Signed)
 Spoke to patient to advise that since she has no personal history of precancerous polyps, no family history colon cancer and a recent negative cologuard, we can hold off on colonoscopy at this time. Advised that she will need to follow with her PCP to decide which mode of testing she would prefer for colorectal cancer screening in 3 years at which time she would be due for repeat testing. She verbalizes understanding of this. Colonoscopy previously scheduled for 03/08/24 has been cancelled.

## 2024-02-26 NOTE — Telephone Encounter (Signed)
 Based on all the information that you have provided and my chart review, she does not need colonoscopy at this time. She should decide on repeat colon cancer screening strategy 3 years from her recent Cologuard.  She can work with her PCP in this regard.  Thanks

## 2024-03-01 ENCOUNTER — Ambulatory Visit: Admitting: Sports Medicine

## 2024-03-04 ENCOUNTER — Ambulatory Visit: Admitting: Sports Medicine

## 2024-03-04 VITALS — HR 60 | Ht 62.0 in | Wt 131.0 lb

## 2024-03-04 DIAGNOSIS — G8929 Other chronic pain: Secondary | ICD-10-CM | POA: Diagnosis not present

## 2024-03-04 DIAGNOSIS — M25562 Pain in left knee: Secondary | ICD-10-CM | POA: Diagnosis not present

## 2024-03-04 DIAGNOSIS — M1712 Unilateral primary osteoarthritis, left knee: Secondary | ICD-10-CM

## 2024-03-04 NOTE — Progress Notes (Signed)
 Beverly Shaw [facility] [address] [place] [address] Phone: [phone]   Assessment and Plan:     1. Primary osteoarthritis of left knee (Primary) 2. Chronic pain of left knee -Chronic with exacerbation, subsequent visit - Significant improvement in left knee pain after intra-articular CSI on 01/19/2024, consistent with resolving flare of osteoarthritis - Patient had no improvement with Celebrex , so in the future if pain returns, would consider repeat intra-articular CSI - Use Tylenol 500 to 1000 mg tablets 2-3 times a day for day-to-day pain relief - Continue HEP for knee.  Based on patient's significant improvement, I do not feel that she needs to start physical therapy at this time    Pertinent previous records reviewed include none   Follow Up: As needed.  Could consider repeat intra-articular CSI   Subjective:   I, Moenique Parris, am serving as a Neurosurgeon for Doctor Morene Mace   Chief Complaint: left knee pain    HPI:    12/22/2023 Patient is a 73 year old female with left knee pain. Patient states that it is hurting. Sometimes not sure if it is just the knee or if it is the whole leg. It has been hurting since about a year ago when she was walking. Uses voltaren  and a wrap over the knee. Now it is keeping her up at night and waking her up.    01/19/2024 Patient states she is the same   03/04/2024 Patient states she is doing well    Relevant Historical Information: GERD  Additional pertinent review of systems negative.   Current Outpatient Medications:    Calcium  Carb-Cholecalciferol (CALCIUM -VITAMIN D3) 600-12.5 MG-MCG CAPS, Take 2 capsules by mouth daily., Disp: , Rfl:    denosumab  (PROLIA ) 60 MG/ML SOSY injection, Inject 60 mg into the skin every 6 (six) months., Disp: , Rfl:    EPINEPHrine  0.3 mg/0.3 mL IJ SOAJ injection, Inject 0.3 mg into the muscle as needed for anaphylaxis., Disp: 2 each, Rfl: 1   Multiple  Vitamin (MULTIVITAMIN) capsule, Take 1 capsule by mouth daily.  , Disp: , Rfl:    Omega-3 Fatty Acids (FISH OIL) 1200 MG CAPS, Take 2 capsules by mouth daily., Disp: , Rfl:    rosuvastatin  (CRESTOR ) 5 MG tablet, TAKE 1 TABLET (5 MG TOTAL) BY MOUTH DAILY., Disp: 90 tablet, Rfl: 3  Current Facility-Administered Medications:    denosumab  (PROLIA ) injection 60 mg, 60 mg, Subcutaneous, Once, Norleen Lynwood ORN, MD   Objective:     Vitals:   03/04/24 1453  Pulse: 60  SpO2: 99%  Weight: 131 lb (59.4 kg)  Height: 5' 2 (1.575 m)      Body mass index is 23.96 kg/m.    Physical Exam:    General:  awake, alert oriented, no acute distress nontoxic Skin: no suspicious lesions or rashes Neuro:sensation intact and strength 5/5 with no deficits, no atrophy, normal muscle tone Psych: No signs of anxiety, depression or other mood disorder  Left knee: No swelling No deformity Neg fluid wave, joint milking ROM Flex 110, Ext 0 NTTP over the quad tendon, medial fem condyle, lat fem condyle, patella, plica, patella tendon, tibial tuberostiy, fibular head, posterior fossa, pes anserine bursa, gerdy's tubercle, medial jt line, lateral jt line Neg anterior and posterior drawer Neg lachman Neg sag sign Negative varus stress Negative valgus stress Negative McMurray Negative Thessaly  Gait normal     Electronically signed by:  Odis Mace D.CLEMENTEEN [facility]  2:59 PM 03/04/24

## 2024-03-08 ENCOUNTER — Encounter: Admitting: Internal Medicine

## 2024-03-21 ENCOUNTER — Ambulatory Visit (INDEPENDENT_AMBULATORY_CARE_PROVIDER_SITE_OTHER)

## 2024-03-21 DIAGNOSIS — M810 Age-related osteoporosis without current pathological fracture: Secondary | ICD-10-CM

## 2024-03-21 MED ORDER — DENOSUMAB 60 MG/ML ~~LOC~~ SOSY: 60.0000 mg | PREFILLED_SYRINGE | SUBCUTANEOUS | Status: AC

## 2024-03-21 NOTE — Progress Notes (Signed)
After obtaining consent, and per orders of Dr. John, injection of Prolia given by  P . Patient instructed to report any adverse reaction to me immediately.  

## 2024-03-29 DIAGNOSIS — M51361 Other intervertebral disc degeneration, lumbar region with lower extremity pain only: Secondary | ICD-10-CM | POA: Diagnosis not present

## 2024-03-29 DIAGNOSIS — M51371 Other intervertebral disc degeneration, lumbosacral region with lower extremity pain only: Secondary | ICD-10-CM | POA: Diagnosis not present

## 2024-03-29 DIAGNOSIS — M9904 Segmental and somatic dysfunction of sacral region: Secondary | ICD-10-CM | POA: Diagnosis not present

## 2024-03-29 DIAGNOSIS — M9903 Segmental and somatic dysfunction of lumbar region: Secondary | ICD-10-CM | POA: Diagnosis not present

## 2024-03-29 DIAGNOSIS — M5442 Lumbago with sciatica, left side: Secondary | ICD-10-CM | POA: Diagnosis not present

## 2024-04-26 DIAGNOSIS — M9904 Segmental and somatic dysfunction of sacral region: Secondary | ICD-10-CM | POA: Diagnosis not present

## 2024-04-26 DIAGNOSIS — M5442 Lumbago with sciatica, left side: Secondary | ICD-10-CM | POA: Diagnosis not present

## 2024-04-26 DIAGNOSIS — M51361 Other intervertebral disc degeneration, lumbar region with lower extremity pain only: Secondary | ICD-10-CM | POA: Diagnosis not present

## 2024-04-26 DIAGNOSIS — M51371 Other intervertebral disc degeneration, lumbosacral region with lower extremity pain only: Secondary | ICD-10-CM | POA: Diagnosis not present

## 2024-04-26 DIAGNOSIS — M9903 Segmental and somatic dysfunction of lumbar region: Secondary | ICD-10-CM | POA: Diagnosis not present

## 2024-06-04 ENCOUNTER — Encounter: Payer: Self-pay | Admitting: Internal Medicine

## 2024-07-11 ENCOUNTER — Ambulatory Visit: Admitting: Internal Medicine

## 2024-07-23 ENCOUNTER — Encounter: Admitting: Family Medicine

## 2025-01-01 ENCOUNTER — Encounter: Admitting: Internal Medicine

## 2025-02-17 ENCOUNTER — Ambulatory Visit
